# Patient Record
Sex: Female | Born: 1935 | Race: White | Hispanic: No | Marital: Married | Smoking: Former smoker
Health system: Southern US, Community
[De-identification: ages and names within clinical notes are randomized; demographics above are authoritative.]

## PROBLEM LIST (undated history)

## (undated) DIAGNOSIS — R0602 Shortness of breath: Secondary | ICD-10-CM

## (undated) DIAGNOSIS — I517 Cardiomegaly: Secondary | ICD-10-CM

## (undated) DIAGNOSIS — I5032 Chronic diastolic (congestive) heart failure: Secondary | ICD-10-CM

## (undated) DIAGNOSIS — I421 Obstructive hypertrophic cardiomyopathy: Secondary | ICD-10-CM

## (undated) DIAGNOSIS — R011 Cardiac murmur, unspecified: Secondary | ICD-10-CM

## (undated) DIAGNOSIS — E7800 Pure hypercholesterolemia, unspecified: Secondary | ICD-10-CM

## (undated) DIAGNOSIS — R0600 Dyspnea, unspecified: Secondary | ICD-10-CM

## (undated) HISTORY — DX: Obstructive hypertrophic cardiomyopathy: I42.1

## (undated) HISTORY — DX: Cardiomegaly: I51.7

## (undated) HISTORY — DX: Pure hypercholesterolemia, unspecified: E78.00

## (undated) HISTORY — DX: Dyspnea, unspecified: R06.00

## (undated) HISTORY — DX: Cardiac murmur, unspecified: R01.1

## (undated) HISTORY — DX: Shortness of breath: R06.02

---

## 2000-01-09 ENCOUNTER — Encounter: Payer: Self-pay | Admitting: Family Medicine

## 2000-01-09 ENCOUNTER — Encounter: Admission: RE | Admit: 2000-01-09 | Discharge: 2000-01-09 | Payer: Self-pay | Admitting: Family Medicine

## 2001-01-15 ENCOUNTER — Encounter: Admission: RE | Admit: 2001-01-15 | Discharge: 2001-01-15 | Payer: Self-pay | Admitting: Family Medicine

## 2001-01-15 ENCOUNTER — Encounter: Payer: Self-pay | Admitting: Family Medicine

## 2001-01-28 ENCOUNTER — Ambulatory Visit (HOSPITAL_COMMUNITY): Admission: RE | Admit: 2001-01-28 | Discharge: 2001-01-28 | Payer: Self-pay | Admitting: *Deleted

## 2001-01-28 ENCOUNTER — Encounter (INDEPENDENT_AMBULATORY_CARE_PROVIDER_SITE_OTHER): Payer: Self-pay | Admitting: *Deleted

## 2001-09-12 ENCOUNTER — Encounter: Admission: RE | Admit: 2001-09-12 | Discharge: 2001-09-12 | Payer: Self-pay | Admitting: Family Medicine

## 2001-09-12 ENCOUNTER — Encounter: Payer: Self-pay | Admitting: Family Medicine

## 2001-12-12 ENCOUNTER — Other Ambulatory Visit: Admission: RE | Admit: 2001-12-12 | Discharge: 2001-12-12 | Payer: Self-pay | Admitting: Family Medicine

## 2002-01-17 ENCOUNTER — Encounter: Payer: Self-pay | Admitting: Family Medicine

## 2002-01-17 ENCOUNTER — Encounter: Admission: RE | Admit: 2002-01-17 | Discharge: 2002-01-17 | Payer: Self-pay | Admitting: Family Medicine

## 2002-02-05 ENCOUNTER — Encounter: Payer: Self-pay | Admitting: Family Medicine

## 2002-02-05 ENCOUNTER — Encounter: Admission: RE | Admit: 2002-02-05 | Discharge: 2002-02-05 | Payer: Self-pay | Admitting: Family Medicine

## 2002-12-20 ENCOUNTER — Emergency Department (HOSPITAL_COMMUNITY): Admission: EM | Admit: 2002-12-20 | Discharge: 2002-12-20 | Payer: Self-pay

## 2002-12-29 ENCOUNTER — Encounter: Admission: RE | Admit: 2002-12-29 | Discharge: 2002-12-29 | Payer: Self-pay | Admitting: Family Medicine

## 2003-03-04 ENCOUNTER — Other Ambulatory Visit: Admission: RE | Admit: 2003-03-04 | Discharge: 2003-03-04 | Payer: Self-pay | Admitting: Family Medicine

## 2003-03-20 ENCOUNTER — Encounter: Admission: RE | Admit: 2003-03-20 | Discharge: 2003-03-20 | Payer: Self-pay | Admitting: Family Medicine

## 2003-05-18 ENCOUNTER — Encounter: Admission: RE | Admit: 2003-05-18 | Discharge: 2003-05-18 | Payer: Self-pay | Admitting: Family Medicine

## 2003-06-04 ENCOUNTER — Ambulatory Visit (HOSPITAL_COMMUNITY): Admission: RE | Admit: 2003-06-04 | Discharge: 2003-06-04 | Payer: Self-pay | Admitting: Family Medicine

## 2004-06-28 ENCOUNTER — Other Ambulatory Visit: Admission: RE | Admit: 2004-06-28 | Discharge: 2004-06-28 | Payer: Self-pay | Admitting: Family Medicine

## 2004-07-06 ENCOUNTER — Encounter: Admission: RE | Admit: 2004-07-06 | Discharge: 2004-07-06 | Payer: Self-pay | Admitting: Family Medicine

## 2005-08-16 ENCOUNTER — Encounter: Admission: RE | Admit: 2005-08-16 | Discharge: 2005-08-16 | Payer: Self-pay | Admitting: Family Medicine

## 2005-08-28 ENCOUNTER — Other Ambulatory Visit: Admission: RE | Admit: 2005-08-28 | Discharge: 2005-08-28 | Payer: Self-pay | Admitting: Family Medicine

## 2006-09-04 ENCOUNTER — Encounter: Admission: RE | Admit: 2006-09-04 | Discharge: 2006-09-04 | Payer: Self-pay | Admitting: Family Medicine

## 2007-11-06 ENCOUNTER — Encounter: Admission: RE | Admit: 2007-11-06 | Discharge: 2007-11-06 | Payer: Self-pay | Admitting: Family Medicine

## 2007-12-10 ENCOUNTER — Other Ambulatory Visit: Admission: RE | Admit: 2007-12-10 | Discharge: 2007-12-10 | Payer: Self-pay | Admitting: Family Medicine

## 2009-01-15 ENCOUNTER — Encounter: Admission: RE | Admit: 2009-01-15 | Discharge: 2009-01-15 | Payer: Self-pay | Admitting: Family Medicine

## 2009-11-17 ENCOUNTER — Ambulatory Visit: Payer: Self-pay | Admitting: Cardiology

## 2009-11-29 ENCOUNTER — Ambulatory Visit (HOSPITAL_COMMUNITY): Admission: RE | Admit: 2009-11-29 | Discharge: 2009-11-29 | Payer: Self-pay | Admitting: Cardiology

## 2009-11-29 ENCOUNTER — Ambulatory Visit: Payer: Self-pay

## 2009-11-29 ENCOUNTER — Ambulatory Visit: Payer: Self-pay | Admitting: Internal Medicine

## 2009-11-29 ENCOUNTER — Encounter: Payer: Self-pay | Admitting: Cardiology

## 2010-01-18 ENCOUNTER — Encounter
Admission: RE | Admit: 2010-01-18 | Discharge: 2010-01-18 | Payer: Self-pay | Source: Home / Self Care | Attending: Family Medicine | Admitting: Family Medicine

## 2010-05-23 ENCOUNTER — Ambulatory Visit: Payer: Self-pay | Admitting: Cardiology

## 2010-05-27 ENCOUNTER — Encounter: Payer: Self-pay | Admitting: Cardiology

## 2010-05-27 DIAGNOSIS — R011 Cardiac murmur, unspecified: Secondary | ICD-10-CM | POA: Insufficient documentation

## 2010-05-27 DIAGNOSIS — E7800 Pure hypercholesterolemia, unspecified: Secondary | ICD-10-CM | POA: Insufficient documentation

## 2010-05-27 DIAGNOSIS — R0602 Shortness of breath: Secondary | ICD-10-CM | POA: Insufficient documentation

## 2010-05-27 DIAGNOSIS — I517 Cardiomegaly: Secondary | ICD-10-CM | POA: Insufficient documentation

## 2010-05-27 DIAGNOSIS — R0600 Dyspnea, unspecified: Secondary | ICD-10-CM | POA: Insufficient documentation

## 2010-06-06 ENCOUNTER — Encounter: Payer: Self-pay | Admitting: Cardiology

## 2010-06-06 ENCOUNTER — Ambulatory Visit (INDEPENDENT_AMBULATORY_CARE_PROVIDER_SITE_OTHER): Payer: Medicare Other | Admitting: Cardiology

## 2010-06-06 DIAGNOSIS — I517 Cardiomegaly: Secondary | ICD-10-CM

## 2010-06-06 DIAGNOSIS — R0602 Shortness of breath: Secondary | ICD-10-CM

## 2010-06-06 NOTE — Patient Instructions (Signed)
Monitor blood pressure at home. Call if your readings are greater than 140 (top) or 85 (bottom).  You need to get more aerobic exercise and lose weight.  Continue current medications.

## 2010-06-07 NOTE — Progress Notes (Signed)
   Melinda Gonzales Date of Birth: Jul 03, 1934   History of Present Illness: [name] is seen for six-month followup of her dyspnea. She is very upset today. Her husband recently had a carotid endarterectomy and suffered a small stroke after this procedure. She states it has changed his personality and he is much more depressed now. She is very concerned about this. In reviewing her own history she still complains of shortness of breath with exertion. She admits that she hasn't been doing exercise and hasn't lost any weight as we had recommended before. Her symptoms really have not changed significantly. She does complain of some sweats at night. She has had no chest pain.  Current Outpatient Prescriptions on File Prior to Visit  Medication Sig Dispense Refill  . Calcium Carbonate-Vitamin D (CALCIUM + D PO) Take 600 mg by mouth daily.        . folic acid (FOLVITE) 800 MCG tablet Take 400 mcg by mouth daily.        . Multiple Vitamin (MULTIVITAMIN) tablet Take 1 tablet by mouth daily.        . Omega-3 Fatty Acids (FISH OIL) 1000 MG CAPS Take 1,000 mg by mouth 2 (two) times daily.        . rosuvastatin (CRESTOR) 5 MG tablet Take 5 mg by mouth daily.        . Glucosamine-Chondroitin (OSTEO BI-FLEX REGULAR STRENGTH PO) Take by mouth daily. 2 DAILY         No Known Allergies  Past Medical History  Diagnosis Date  . Hypercholesterolemia   . Dyspnea   . Heart murmur   . LVH (left ventricular hypertrophy)     WITH NORMAL SYSTOLIC FUNCTION  . SOB (shortness of breath)     History reviewed. No pertinent past surgical history.  History  Smoking status  . Former Smoker  . Quit date: 05/26/1965  Smokeless tobacco  . Not on file    History  Alcohol Use No    Family History  Problem Relation Age of Onset  . Heart failure Mother 36  . Heart attack Father 52    Review of Systems: The review of systems is positive for anxiety and occasional lightheadedness. She has chronic  dyspnea on exertion.  All other systems were reviewed and are negative.  Physical Exam: BP 150/82  Pulse 73  Ht 5\' 1"  (1.549 m)  Wt 194 lb (87.998 kg)  BMI 36.66 kg/m2 She is an obese white female in no distress. Her HEENT exam is unremarkable. She has no JVD or bruits. Lungs are clear. Cardiac exam reveals a harsh grade 2/6 systolic murmur the right upper sternal border. There is no S3. Abdomen is soft and nontender. She has no edema. Pedal pulses are excellent. Neurologic exam is nonfocal. LABORATORY DATA: ECG today demonstrates normal sinus rhythm with mild voltage criteria for LVH with nonspecific T-wave abnormality.  Assessment / Plan:

## 2010-06-07 NOTE — Assessment & Plan Note (Signed)
Her symptoms of dyspnea are unchanged. She had extensive cardiac evaluation in the past including nuclear stress testing which was normal and an echocardiogram this past October which showed moderate LVH with normal systolic function. There was mild mitral insufficiency. It was felt that her murmur was related to her vigorous LV contractility. I think most of her symptoms are related to obesity and poor cardiovascular conditioning. I have recommended a regular aerobic exercise walking 30-45 minutes per day with focus on losing 20-30 pounds. I think if she does this she would notice a marked improvement in her symptoms. The other consideration would be to consider a sleep study but I think it is unlikely that she has sleep apnea.

## 2010-06-07 NOTE — Assessment & Plan Note (Signed)
Blood pressure is mildly elevated today but readings in the past been acceptable. I've asked that she monitor her blood pressure readings regularly and report back if there over 140/85. If so we would need to add additional antihypertensive therapy.

## 2010-12-14 ENCOUNTER — Other Ambulatory Visit: Payer: Self-pay | Admitting: Family Medicine

## 2010-12-14 DIAGNOSIS — Z1231 Encounter for screening mammogram for malignant neoplasm of breast: Secondary | ICD-10-CM

## 2011-02-02 ENCOUNTER — Ambulatory Visit
Admission: RE | Admit: 2011-02-02 | Discharge: 2011-02-02 | Disposition: A | Discharge status: Home / Self Care | Payer: Medicare Other | Source: Ambulatory Visit | Attending: Family Medicine | Admitting: Family Medicine

## 2011-02-02 DIAGNOSIS — Z1231 Encounter for screening mammogram for malignant neoplasm of breast: Secondary | ICD-10-CM | POA: Diagnosis not present

## 2011-02-21 DIAGNOSIS — E7800 Pure hypercholesterolemia, unspecified: Secondary | ICD-10-CM | POA: Diagnosis not present

## 2011-08-28 DIAGNOSIS — Z780 Asymptomatic menopausal state: Secondary | ICD-10-CM | POA: Diagnosis not present

## 2011-08-28 DIAGNOSIS — Z0000 Encounter for general adult medical examination without abnormal findings: Secondary | ICD-10-CM | POA: Diagnosis not present

## 2011-08-28 DIAGNOSIS — E7800 Pure hypercholesterolemia, unspecified: Secondary | ICD-10-CM | POA: Diagnosis not present

## 2011-10-12 DIAGNOSIS — Z23 Encounter for immunization: Secondary | ICD-10-CM | POA: Diagnosis not present

## 2011-10-12 DIAGNOSIS — Z780 Asymptomatic menopausal state: Secondary | ICD-10-CM | POA: Diagnosis not present

## 2012-04-04 DIAGNOSIS — J9801 Acute bronchospasm: Secondary | ICD-10-CM | POA: Diagnosis not present

## 2012-04-04 DIAGNOSIS — R05 Cough: Secondary | ICD-10-CM | POA: Diagnosis not present

## 2012-04-04 DIAGNOSIS — R059 Cough, unspecified: Secondary | ICD-10-CM | POA: Diagnosis not present

## 2012-04-04 DIAGNOSIS — J40 Bronchitis, not specified as acute or chronic: Secondary | ICD-10-CM | POA: Diagnosis not present

## 2012-09-12 DIAGNOSIS — M719 Bursopathy, unspecified: Secondary | ICD-10-CM | POA: Diagnosis not present

## 2012-09-12 DIAGNOSIS — M67919 Unspecified disorder of synovium and tendon, unspecified shoulder: Secondary | ICD-10-CM | POA: Diagnosis not present

## 2012-09-16 DIAGNOSIS — M67919 Unspecified disorder of synovium and tendon, unspecified shoulder: Secondary | ICD-10-CM | POA: Diagnosis not present

## 2012-09-23 DIAGNOSIS — M67919 Unspecified disorder of synovium and tendon, unspecified shoulder: Secondary | ICD-10-CM | POA: Diagnosis not present

## 2012-09-24 DIAGNOSIS — B079 Viral wart, unspecified: Secondary | ICD-10-CM | POA: Diagnosis not present

## 2012-09-24 DIAGNOSIS — L570 Actinic keratosis: Secondary | ICD-10-CM | POA: Diagnosis not present

## 2012-09-24 DIAGNOSIS — Z85828 Personal history of other malignant neoplasm of skin: Secondary | ICD-10-CM | POA: Diagnosis not present

## 2012-09-24 DIAGNOSIS — D239 Other benign neoplasm of skin, unspecified: Secondary | ICD-10-CM | POA: Diagnosis not present

## 2012-10-07 DIAGNOSIS — M67919 Unspecified disorder of synovium and tendon, unspecified shoulder: Secondary | ICD-10-CM | POA: Diagnosis not present

## 2012-10-09 DIAGNOSIS — Z23 Encounter for immunization: Secondary | ICD-10-CM | POA: Diagnosis not present

## 2012-10-15 DIAGNOSIS — M67919 Unspecified disorder of synovium and tendon, unspecified shoulder: Secondary | ICD-10-CM | POA: Diagnosis not present

## 2012-10-22 DIAGNOSIS — M67919 Unspecified disorder of synovium and tendon, unspecified shoulder: Secondary | ICD-10-CM | POA: Diagnosis not present

## 2012-10-29 DIAGNOSIS — M67919 Unspecified disorder of synovium and tendon, unspecified shoulder: Secondary | ICD-10-CM | POA: Diagnosis not present

## 2013-03-06 DIAGNOSIS — E7800 Pure hypercholesterolemia, unspecified: Secondary | ICD-10-CM | POA: Diagnosis not present

## 2013-03-06 DIAGNOSIS — R011 Cardiac murmur, unspecified: Secondary | ICD-10-CM | POA: Diagnosis not present

## 2013-03-06 DIAGNOSIS — Z23 Encounter for immunization: Secondary | ICD-10-CM | POA: Diagnosis not present

## 2013-03-06 DIAGNOSIS — Z0000 Encounter for general adult medical examination without abnormal findings: Secondary | ICD-10-CM | POA: Diagnosis not present

## 2013-03-10 ENCOUNTER — Other Ambulatory Visit: Payer: Self-pay

## 2013-03-10 DIAGNOSIS — Z1231 Encounter for screening mammogram for malignant neoplasm of breast: Secondary | ICD-10-CM

## 2013-03-27 ENCOUNTER — Ambulatory Visit: Payer: Medicare Other

## 2013-04-03 DIAGNOSIS — D485 Neoplasm of uncertain behavior of skin: Secondary | ICD-10-CM | POA: Diagnosis not present

## 2013-04-03 DIAGNOSIS — D0439 Carcinoma in situ of skin of other parts of face: Secondary | ICD-10-CM | POA: Diagnosis not present

## 2013-04-03 DIAGNOSIS — L570 Actinic keratosis: Secondary | ICD-10-CM | POA: Diagnosis not present

## 2013-04-03 DIAGNOSIS — L821 Other seborrheic keratosis: Secondary | ICD-10-CM | POA: Diagnosis not present

## 2013-04-03 DIAGNOSIS — Z85828 Personal history of other malignant neoplasm of skin: Secondary | ICD-10-CM | POA: Diagnosis not present

## 2013-04-03 DIAGNOSIS — D0430 Carcinoma in situ of skin of unspecified part of face: Secondary | ICD-10-CM | POA: Diagnosis not present

## 2013-04-10 DIAGNOSIS — D0439 Carcinoma in situ of skin of other parts of face: Secondary | ICD-10-CM | POA: Diagnosis not present

## 2013-04-10 DIAGNOSIS — D0430 Carcinoma in situ of skin of unspecified part of face: Secondary | ICD-10-CM | POA: Diagnosis not present

## 2013-04-10 DIAGNOSIS — Z85828 Personal history of other malignant neoplasm of skin: Secondary | ICD-10-CM | POA: Diagnosis not present

## 2013-04-17 ENCOUNTER — Ambulatory Visit
Admission: RE | Admit: 2013-04-17 | Discharge: 2013-04-17 | Disposition: A | Discharge status: Home / Self Care | Payer: Medicare Other | Source: Ambulatory Visit

## 2013-04-17 DIAGNOSIS — Z1231 Encounter for screening mammogram for malignant neoplasm of breast: Secondary | ICD-10-CM | POA: Diagnosis not present

## 2013-04-24 DIAGNOSIS — H2510 Age-related nuclear cataract, unspecified eye: Secondary | ICD-10-CM | POA: Diagnosis not present

## 2013-04-24 DIAGNOSIS — H3530 Unspecified macular degeneration: Secondary | ICD-10-CM | POA: Diagnosis not present

## 2013-09-04 DIAGNOSIS — E7800 Pure hypercholesterolemia, unspecified: Secondary | ICD-10-CM | POA: Diagnosis not present

## 2013-10-25 DIAGNOSIS — Z23 Encounter for immunization: Secondary | ICD-10-CM | POA: Diagnosis not present

## 2014-03-02 ENCOUNTER — Ambulatory Visit (INDEPENDENT_AMBULATORY_CARE_PROVIDER_SITE_OTHER): Payer: Medicare Other | Admitting: Cardiology

## 2014-03-02 ENCOUNTER — Ambulatory Visit
Admission: RE | Admit: 2014-03-02 | Discharge: 2014-03-02 | Disposition: A | Discharge status: Home / Self Care | Payer: Medicare Other | Source: Ambulatory Visit | Attending: Cardiology | Admitting: Cardiology

## 2014-03-02 ENCOUNTER — Encounter: Payer: Self-pay | Admitting: Cardiology

## 2014-03-02 VITALS — BP 120/68 | HR 78 | Ht 62.0 in | Wt 189.8 lb

## 2014-03-02 DIAGNOSIS — I517 Cardiomegaly: Secondary | ICD-10-CM

## 2014-03-02 DIAGNOSIS — J984 Other disorders of lung: Secondary | ICD-10-CM | POA: Diagnosis not present

## 2014-03-02 DIAGNOSIS — R0600 Dyspnea, unspecified: Secondary | ICD-10-CM | POA: Diagnosis not present

## 2014-03-02 DIAGNOSIS — R0602 Shortness of breath: Secondary | ICD-10-CM

## 2014-03-02 DIAGNOSIS — R011 Cardiac murmur, unspecified: Secondary | ICD-10-CM

## 2014-03-02 DIAGNOSIS — E7800 Pure hypercholesterolemia, unspecified: Secondary | ICD-10-CM

## 2014-03-02 NOTE — Progress Notes (Signed)
   Melinda Gonzales Date of Birth: 09/16/1934   History of Present Illness: [name] is referred by Dr Laurann Montana for evaluation of dyspnea.  She is a 80 yo WF with history of obesity and hypercholesterolemia. She complains of dyspnea with exertion particularly if she is carrying groceries or walking up steps or an incline. She does not exercise and is very sedentary. She gets occasional palpitations and chest tightness at rest. She complains of pain in her back between her shoulder blades. She denies any cough or wheezing. She complains of sweats.  She was evaluated in 2011 with a Myoview stress test which was normal. She had an Echo at that time that showed moderate LVH with EF 65-70%. Mild MR. She does have a history of murmur. She is not a smoker.   Current Outpatient Prescriptions on File Prior to Visit  Medication Sig Dispense Refill  . rosuvastatin (CRESTOR) 5 MG tablet Take 5 mg by mouth daily.       No current facility-administered medications on file prior to visit.    No Known Allergies  Past Medical History  Diagnosis Date  . Hypercholesterolemia   . Dyspnea   . Heart murmur   . LVH (left ventricular hypertrophy)     WITH NORMAL SYSTOLIC FUNCTION  . SOB (shortness of breath)     History reviewed. No pertinent past surgical history.  History  Smoking status  . Former Smoker  . Quit date: 05/26/1965  Smokeless tobacco  . Not on file    History  Alcohol Use No    Family History  Problem Relation Age of Onset  . Heart failure Mother 4  . Heart attack Father 35    Review of Systems: The review of systems is positive for anxiety.   All other systems were reviewed and are negative.  Physical Exam: BP 120/68 mmHg  Pulse 78  Ht 5\' 2"  (1.575 m)  Wt 189 lb 12.8 oz (86.093 kg)  BMI 34.71 kg/m2 She is an obese white female in no distress. Her HEENT exam is unremarkable. She has no JVD or bruits. Lungs are clear. Cardiac exam reveals a harsh grade 3/6  systolic murmur at the  right upper sternal border radiating to the apex. There is no S3. Abdomen is soft and nontender. She has no edema. Pedal pulses are excellent. Neurologic exam is nonfocal. Body mass index is 34.71 kg/(m^2).   LABORATORY DATA: ECG today demonstrates normal sinus rhythm with rate 78 bpm. LVH with repolarization abnormality. I have personally reviewed and interpreted this study.   Assessment / Plan: 1. Dyspnea on exertion. This is a chronic complaint. Cardiac evaluation in 2011 was unremarkable. I suspect her symptoms are largely related to her obesity and deconditioning. Her last CXR in 2005 showed changes of chronic bronchitis. Will update CXR. She is scheduled for lab work next week with Dr. Laurann Montana. Will add BNP level. She does have a prominent murmur. Will repeat an Echo. If these studies are unrevealing I would consider her for a cardiopulmonary stress test.  2. Murmur. Prior Echo was unrevealing. Will repeat. She may have a dynamic LVOT obstruction. ?HOCM  3. Palpitations. Will evaluate with 48 hr Holter monitor.  4. Obesity.  5. Hypercholesterolemia.

## 2014-03-02 NOTE — Patient Instructions (Signed)
We will schedule you for a chest Xray and Echocardiogram  We will have you wear a monitor for 48 hours  We will review your lab data from Dr. Laurann Montana  Further recommendations will follow.

## 2014-03-04 ENCOUNTER — Other Ambulatory Visit: Payer: Self-pay

## 2014-03-04 DIAGNOSIS — R002 Palpitations: Secondary | ICD-10-CM

## 2014-03-05 ENCOUNTER — Encounter: Payer: Self-pay | Admitting: *Deleted

## 2014-03-05 ENCOUNTER — Encounter (INDEPENDENT_AMBULATORY_CARE_PROVIDER_SITE_OTHER): Payer: Medicare Other

## 2014-03-05 ENCOUNTER — Ambulatory Visit (HOSPITAL_COMMUNITY): Payer: Medicare Other | Attending: Cardiology | Admitting: Radiology

## 2014-03-05 DIAGNOSIS — R0602 Shortness of breath: Secondary | ICD-10-CM | POA: Diagnosis present

## 2014-03-05 DIAGNOSIS — R011 Cardiac murmur, unspecified: Secondary | ICD-10-CM | POA: Diagnosis not present

## 2014-03-05 DIAGNOSIS — R002 Palpitations: Secondary | ICD-10-CM

## 2014-03-05 DIAGNOSIS — I517 Cardiomegaly: Secondary | ICD-10-CM

## 2014-03-05 DIAGNOSIS — R0600 Dyspnea, unspecified: Secondary | ICD-10-CM | POA: Diagnosis not present

## 2014-03-05 DIAGNOSIS — E7800 Pure hypercholesterolemia, unspecified: Secondary | ICD-10-CM

## 2014-03-05 NOTE — Progress Notes (Signed)
Echocardiogram performed.  

## 2014-03-05 NOTE — Progress Notes (Signed)
Patient ID: Melinda Gonzales, female   DOB: 18-Apr-1934, 80 y.o.   MRN: [id number] [employer] 48 hour holter monitor applied to patient.

## 2014-03-09 ENCOUNTER — Telehealth: Payer: Self-pay | Admitting: Cardiology

## 2014-03-09 ENCOUNTER — Other Ambulatory Visit: Payer: Self-pay

## 2014-03-09 ENCOUNTER — Other Ambulatory Visit: Payer: Medicare Other

## 2014-03-09 ENCOUNTER — Other Ambulatory Visit (INDEPENDENT_AMBULATORY_CARE_PROVIDER_SITE_OTHER): Payer: Medicare Other | Admitting: *Deleted

## 2014-03-09 DIAGNOSIS — R0600 Dyspnea, unspecified: Secondary | ICD-10-CM

## 2014-03-09 DIAGNOSIS — I517 Cardiomegaly: Secondary | ICD-10-CM

## 2014-03-09 DIAGNOSIS — R011 Cardiac murmur, unspecified: Secondary | ICD-10-CM

## 2014-03-09 DIAGNOSIS — E7800 Pure hypercholesterolemia, unspecified: Secondary | ICD-10-CM

## 2014-03-09 LAB — BASIC METABOLIC PANEL
BUN: 22 mg/dL (ref 6–23)
CO2: 28 mEq/L (ref 19–32)
CREATININE: 1 mg/dL (ref 0.40–1.20)
Calcium: 9.3 mg/dL (ref 8.4–10.5)
Chloride: 106 mEq/L (ref 96–112)
GFR: 57.02 mL/min — ABNORMAL LOW (ref >60.00)
GLUCOSE: 100 mg/dL — AB (ref 70–99)
POTASSIUM: 4 meq/L (ref 3.5–5.1)
SODIUM: 140 meq/L (ref 135–145)

## 2014-03-09 LAB — LIPID PANEL
CHOLESTEROL: 168 mg/dL (ref 0–200)
HDL: 50.8 mg/dL (ref >39.00)
LDL Cholesterol: 86 mg/dL (ref 0–99)
NonHDL: 117.2
TRIGLYCERIDES: 157 mg/dL — AB (ref 0.0–149.0)
Total CHOL/HDL Ratio: 3
VLDL: 31.4 mg/dL (ref 0.0–40.0)

## 2014-03-09 LAB — BRAIN NATRIURETIC PEPTIDE: Pro B Natriuretic peptide (BNP): 206 pg/mL — ABNORMAL HIGH (ref 0.0–100.0)

## 2014-03-09 LAB — HEPATIC FUNCTION PANEL
ALT: 7 U/L (ref 0–35)
AST: 14 U/L (ref 0–37)
Albumin: 4.1 g/dL (ref 3.5–5.2)
Alkaline Phosphatase: 68 U/L (ref 39–117)
Bilirubin, Direct: 0.1 mg/dL (ref 0.0–0.3)
Total Bilirubin: 0.4 mg/dL (ref 0.2–1.2)
Total Protein: 7.2 g/dL (ref 6.0–8.3)

## 2014-03-09 MED ORDER — POTASSIUM CHLORIDE CRYS ER 20 MEQ PO TBCR: 20.0000 meq | EXTENDED_RELEASE_TABLET | Freq: Every day | ORAL | Status: DC

## 2014-03-09 MED ORDER — FUROSEMIDE 20 MG PO TABS: 20.0000 mg | ORAL_TABLET | Freq: Every day | ORAL | Status: DC

## 2014-03-09 MED ORDER — METOPROLOL SUCCINATE ER 50 MG PO TB24: 50.0000 mg | ORAL_TABLET | Freq: Every day | ORAL | Status: DC

## 2014-03-09 NOTE — Telephone Encounter (Signed)
Returned call to patient she stated she wanted to clarify medication that was sent to pharmacy this morning.Medication reviewed with patient she verbalized understanding.Advised to call back if needed.

## 2014-03-09 NOTE — Telephone Encounter (Signed)
Returning your call. °

## 2014-03-10 ENCOUNTER — Telehealth: Payer: Self-pay | Admitting: Cardiology

## 2014-03-10 NOTE — Telephone Encounter (Signed)
Pt called in stating that she has not received a call for anyone to schedule her cardiac MRI . Please f/u  Thanks

## 2014-03-10 NOTE — Telephone Encounter (Signed)
Returned call to patient I will send another message to schedulers to schedule Cardiac MRI.

## 2014-03-11 ENCOUNTER — Encounter: Payer: Self-pay | Admitting: Cardiology

## 2014-03-13 DIAGNOSIS — K219 Gastro-esophageal reflux disease without esophagitis: Secondary | ICD-10-CM | POA: Diagnosis not present

## 2014-03-13 DIAGNOSIS — I420 Dilated cardiomyopathy: Secondary | ICD-10-CM | POA: Diagnosis not present

## 2014-03-13 DIAGNOSIS — E780 Pure hypercholesterolemia: Secondary | ICD-10-CM | POA: Diagnosis not present

## 2014-03-13 DIAGNOSIS — N183 Chronic kidney disease, stage 3 (moderate): Secondary | ICD-10-CM | POA: Diagnosis not present

## 2014-03-13 DIAGNOSIS — R011 Cardiac murmur, unspecified: Secondary | ICD-10-CM | POA: Diagnosis not present

## 2014-03-13 DIAGNOSIS — Z0000 Encounter for general adult medical examination without abnormal findings: Secondary | ICD-10-CM | POA: Diagnosis not present

## 2014-03-13 DIAGNOSIS — Z01419 Encounter for gynecological examination (general) (routine) without abnormal findings: Secondary | ICD-10-CM | POA: Diagnosis not present

## 2014-03-13 DIAGNOSIS — Z1389 Encounter for screening for other disorder: Secondary | ICD-10-CM | POA: Diagnosis not present

## 2014-03-13 NOTE — Telephone Encounter (Signed)
Spoke to patient Cardiac MRI scheduled 03/25/14 at 3:00 pm at [facility] mailed instructions.Follow up appointment scheduled with Dr.Jordan 04/03/14 at 9:45 pm.

## 2014-03-20 ENCOUNTER — Telehealth: Payer: Self-pay

## 2014-03-20 NOTE — Telephone Encounter (Signed)
Patient called Dr.Jordan reviewed 48 hour monitor,revealed occasional pac's,pvc's.5 beat run pac's.9 beat run of nsvt.Continue same medications.Keep appointment with Dr.Jordan 04/23/14 at 3:30 pm.

## 2014-03-25 ENCOUNTER — Ambulatory Visit (HOSPITAL_COMMUNITY)
Admission: RE | Admit: 2014-03-25 | Discharge: 2014-03-25 | Disposition: A | Discharge status: Home / Self Care | Payer: Medicare Other | Source: Ambulatory Visit | Attending: Cardiology | Admitting: Cardiology

## 2014-03-25 DIAGNOSIS — E7800 Pure hypercholesterolemia, unspecified: Secondary | ICD-10-CM

## 2014-03-25 DIAGNOSIS — R0600 Dyspnea, unspecified: Secondary | ICD-10-CM | POA: Diagnosis not present

## 2014-03-25 DIAGNOSIS — R011 Cardiac murmur, unspecified: Secondary | ICD-10-CM | POA: Diagnosis not present

## 2014-03-25 DIAGNOSIS — I517 Cardiomegaly: Secondary | ICD-10-CM | POA: Diagnosis not present

## 2014-03-25 MED ORDER — GADOBENATE DIMEGLUMINE 529 MG/ML IV SOLN
30.0000 mL | Freq: Once | INTRAVENOUS | Status: AC
  Administered 2014-03-25: 30 mL via INTRAVENOUS

## 2014-04-03 ENCOUNTER — Ambulatory Visit: Payer: Medicare Other | Admitting: Cardiology

## 2014-04-16 DIAGNOSIS — L570 Actinic keratosis: Secondary | ICD-10-CM | POA: Diagnosis not present

## 2014-04-16 DIAGNOSIS — D485 Neoplasm of uncertain behavior of skin: Secondary | ICD-10-CM | POA: Diagnosis not present

## 2014-04-16 DIAGNOSIS — Z85828 Personal history of other malignant neoplasm of skin: Secondary | ICD-10-CM | POA: Diagnosis not present

## 2014-04-23 ENCOUNTER — Encounter: Payer: Self-pay | Admitting: Cardiology

## 2014-04-23 ENCOUNTER — Ambulatory Visit (INDEPENDENT_AMBULATORY_CARE_PROVIDER_SITE_OTHER): Payer: Medicare Other | Admitting: Cardiology

## 2014-04-23 VITALS — BP 130/70 | HR 82 | Ht 61.5 in | Wt 188.0 lb

## 2014-04-23 DIAGNOSIS — I472 Ventricular tachycardia: Secondary | ICD-10-CM

## 2014-04-23 DIAGNOSIS — E7800 Pure hypercholesterolemia, unspecified: Secondary | ICD-10-CM

## 2014-04-23 DIAGNOSIS — I421 Obstructive hypertrophic cardiomyopathy: Secondary | ICD-10-CM

## 2014-04-23 DIAGNOSIS — I4729 Other ventricular tachycardia: Secondary | ICD-10-CM

## 2014-04-23 DIAGNOSIS — R0602 Shortness of breath: Secondary | ICD-10-CM

## 2014-04-23 MED ORDER — METOPROLOL SUCCINATE ER 100 MG PO TB24: 100.0000 mg | ORAL_TABLET | Freq: Every day | ORAL | Status: DC

## 2014-04-23 NOTE — Patient Instructions (Signed)
Increase Toprol XL to 100 mg dailly  We will check your potassium level and renal function  I will see you in 6 weeks.  Try and lose weight.

## 2014-04-24 DIAGNOSIS — I4729 Other ventricular tachycardia: Secondary | ICD-10-CM | POA: Insufficient documentation

## 2014-04-24 DIAGNOSIS — I472 Ventricular tachycardia: Secondary | ICD-10-CM | POA: Insufficient documentation

## 2014-04-24 LAB — BASIC METABOLIC PANEL
BUN: 26 mg/dL — AB (ref 6–23)
CHLORIDE: 105 meq/L (ref 96–112)
CO2: 20 meq/L (ref 19–32)
CREATININE: 1.01 mg/dL (ref 0.50–1.10)
Calcium: 9.4 mg/dL (ref 8.4–10.5)
Glucose, Bld: 103 mg/dL — ABNORMAL HIGH (ref 70–99)
Potassium: 5 mEq/L (ref 3.5–5.3)
Sodium: 139 mEq/L (ref 135–145)

## 2014-04-24 NOTE — Progress Notes (Signed)
Melinda Gonzales Date of Birth: 28-Sep-1934   History of Present Illness: [name] is seen for follow up HOCM.  She is a 80 yo WF with history of obesity and hypercholesterolemia. She presented with complaints of dyspnea with exertion particularly if she is carrying groceries or walking up steps or an incline. She does not exercise and is very sedentary. She gets occasional palpitations and chest tightness at rest. She complains of pain in her back between her shoulder blades. She denies any cough or wheezing. She complains of sweats.  She was evaluated in 2011 with a Myoview stress test which was normal. She had an Echo at that time that showed moderate LVH with EF 65-70%. Mild MR. She does have a history of murmur. She is not a smoker. On recent evaluation we repeated an Echo and also did a cardiac MRI. These with both consistent with HOCM. She was started on low dose lasix and Toprol XL 50 mg daily. She has noted little change in her symptoms. No dizziness or syncope. No known family history of HCM. Her father died of an MI at age 15. Mother died of CHF at age 26. No family history of sudden death.  Current Outpatient Prescriptions on File Prior to Visit  Medication Sig Dispense Refill  . furosemide (LASIX) 20 MG tablet Take 1 tablet (20 mg total) by mouth daily. 30 tablet 6  . potassium chloride SA (K-DUR,KLOR-CON) 20 MEQ tablet Take 1 tablet (20 mEq total) by mouth daily. 30 tablet 6  . rosuvastatin (CRESTOR) 5 MG tablet Take 5 mg by mouth daily.       No current facility-administered medications on file prior to visit.    No Known Allergies  Past Medical History  Diagnosis Date  . Hypercholesterolemia   . Dyspnea   . Heart murmur   . LVH (left ventricular hypertrophy)     WITH NORMAL SYSTOLIC FUNCTION  . SOB (shortness of breath)   . Hypertrophic obstructive cardiomyopathy (HOCM)     History reviewed. No pertinent past surgical history.  History  Smoking status  .  Former Smoker  . Quit date: 05/26/1965  Smokeless tobacco  . Not on file    History  Alcohol Use No    Family History  Problem Relation Age of Onset  . Heart failure Mother 27  . Heart attack Father 80    Review of Systems: The review of systems is positive for anxiety.   All other systems were reviewed and are negative.  Physical Exam: BP 130/70 mmHg  Pulse 82  Ht 5' 1.5" (1.562 m)  Wt 188 lb (85.276 kg)  BMI 34.95 kg/m2 She is an obese white female in no distress. Her HEENT exam is unremarkable. She has no JVD or bruits. Lungs are clear. Cardiac exam reveals a harsh grade 3/6 systolic murmur at the  right upper sternal border radiating to the apex. There is no S3. Abdomen is soft and nontender. She has no edema. Pedal pulses are excellent. Neurologic exam is nonfocal. Body mass index is 34.95 kg/(m^2).   LABORATORY DATA: Echo: 03/05/14:Transthoracic Echocardiography  Patient:  Melinda, Gonzales MR #:    [id number] Study Date: 03/05/2014 Gender:   F Age:    25 Height:   157.5 cm Weight:   85.7 kg BSA:    1.98 m^2 Pt. Status: Room:  ATTENDING  Melinda Gonzales, M.D. ORDERING   Melinda Gonzales, M.D. REFERRING  Melinda Gonzales, M.D. SONOGRAPHER Melinda Gonzales, RDCS PERFORMING  Melinda Gonzales, Outpatient  cc:  ------------------------------------------------------------------- LV EF: 65% -  70%  ------------------------------------------------------------------- Indications:   SOB (R06.02). Murmur (R06.00).  ------------------------------------------------------------------- History:  PMH: Acquired from the patient and from the patient&'s chart. Dyspnea and Harsh 3/6 systolic murmur RUSB radiating to the apex. Risk factors: Former tobacco use. Hypertension. Obese.  ------------------------------------------------------------------- Study Conclusions  - Left ventricle: The cavity size was normal. There was severe concentric hypertrophy.  Systolic function was vigorous. The estimated ejection fraction was in the range of 65% to 70%. Wall motion was normal; there were no regional wall motion abnormalities. Features are consistent with a pseudonormal left ventricular filling pattern, with concomitant abnormal relaxation and increased filling pressure (grade 2 diastolic dysfunction). Doppler parameters are consistent with elevated ventricular end-diastolic filling pressure. - Aortic valve: Trileaflet; mildly thickened, mildly calcified leaflets. There was mild regurgitation. - Aortic root: The aortic root was normal in size. - Mitral valve: Structurally normal valve. There was mild regurgitation. - Left atrium: The atrium was mildly dilated. - Right ventricle: The cavity size was normal. Wall thickness was normal. Systolic function was normal. - Right atrium: The atrium was normal in size. - Pulmonic valve: There was trivial regurgitation. - Pulmonary arteries: Systolic pressure was within the normal range. PA peak pressure: 36 mm Hg (S). - Inferior vena cava: The vessel was normal in size. - Pericardium, extracardiac: There was no pericardial effusion.  Impressions:  - There is severe left ventricular hypertrophy with systolic near cavity obliteration and LVOT gradient of 112 mmHg at rest. LVEF is hyperdynamic. There is systolic anterior motion of the mitral valve leaflet. Aortic valve is poorly visualized but appears mildly thickened and calcified, transaortic gradient couldn&'t be assessed sec to significant LVOT gradient. There is mild aortic insufficiency.  A cardiac MRI should be considered for evaluation of hypertrophic cardiomyopathy.  ------------------------------------------------------------------- Labs, prior tests, procedures, and surgery: Echocardiography (October 2011).   EF was 70%.  Transthoracic echocardiography. M-mode, complete 2D,  spectral Doppler, and color Doppler. Birthdate: Patient birthdate: 05/07/1934. Age: Patient is 80 yr old. Sex: Gender: female. BMI: 34.6 kg/m^2. Blood pressure:   120/68 Patient status: Outpatient. Study date: Study date: 03/05/2014. Study time: 09:43 AM. Location: [facility]  -------------------------------------------------------------------  ------------------------------------------------------------------- Left ventricle: The cavity size was normal. There was severe concentric hypertrophy. Systolic function was vigorous. The estimated ejection fraction was in the range of 65% to 70%. Wall motion was normal; there were no regional wall motion abnormalities. Features are consistent with a pseudonormal left ventricular filling pattern, with concomitant abnormal relaxation and increased filling pressure (grade 2 diastolic dysfunction). Doppler parameters are consistent with elevated ventricular end-diastolic filling pressure.  ------------------------------------------------------------------- Aortic valve:  Trileaflet; mildly thickened, mildly calcified leaflets. Mobility was not restricted. Doppler: Transvalvular velocity couldn&'t be assessed. There was mild regurgitation.  ------------------------------------------------------------------- Aorta: Aortic root: The aortic root was normal in size.  ------------------------------------------------------------------- Mitral valve:  Structurally normal valve.  Mobility was not restricted. Doppler: Transvalvular velocity was within the normal range. There was no evidence for stenosis. There was mild regurgitation.  Peak gradient (D): 5 mm Hg.  ------------------------------------------------------------------- Left atrium: The atrium was mildly dilated.  ------------------------------------------------------------------- Right ventricle: The cavity size was normal. Wall thickness was normal.  Systolic function was normal.  ------------------------------------------------------------------- Pulmonic valve:  Structurally normal valve.  Cusp separation was normal. Doppler: Transvalvular velocity was within the normal range. There was no evidence for stenosis. There was trivial regurgitation.  ------------------------------------------------------------------- Tricuspid valve:  Structurally normal valve.  Doppler: Transvalvular velocity was  within the normal range. There was mild regurgitation.  ------------------------------------------------------------------- Pulmonary artery:  The main pulmonary artery was normal-sized. Systolic pressure was within the normal range.  ------------------------------------------------------------------- Right atrium: The atrium was normal in size.  ------------------------------------------------------------------- Pericardium: There was no pericardial effusion.  ------------------------------------------------------------------- Systemic veins: Inferior vena cava: The vessel was normal in size.  ------------------------------------------------------------------- Measurements  Left ventricle             Value    Reference LV ID, ED, PLAX chordal    (L)   37.6 mm   43 - 52 LV ID, ES, PLAX chordal    (L)   20.7 mm   23 - 38 LV fx shortening, PLAX chordal     45  %   >=29 LV PW thickness, ED          14.9 mm   --------- IVS/LV PW ratio, ED      (H)   1.38     <=1.3 LV e&', lateral             5.15 cm/s  --------- LV E/e&', lateral            21.94    --------- LV e&', medial             3.73 cm/s  --------- LV E/e&', medial            30.29    --------- LV e&', average             4.44 cm/s  --------- LV E/e&', average            25.45     ---------  Ventricular septum           Value    Reference IVS thickness, ED           20.5 mm   ---------  Aorta                 Value    Reference Aortic root ID, ED           27  mm   --------- Ascending aorta ID, A-P, S       33  mm   ---------  Left atrium              Value    Reference LA ID, A-P, ES             41  mm   --------- LA ID/bsa, A-P             2.08 cm/m^2 <=2.2 LA volume, S              72  ml   --------- LA volume/bsa, S            36.5 ml/m^2 --------- LA volume, ES, 1-p A4C         73  ml   --------- LA volume/bsa, ES, 1-p A4C       37  ml/m^2 --------- LA volume, ES, 1-p A2C         69  ml   --------- LA volume/bsa, ES, 1-p A2C       34.9 ml/m^2 ---------  Mitral valve              Value    Reference Mitral E-wave peak velocity      113  cm/s  --------- Mitral A-wave peak velocity      65.6 cm/s  --------- Mitral deceleration time    (H)  303  ms   150 - 230 Mitral peak gradient, D        5   mm Hg --------- Mitral E/A ratio, peak         1.7     ---------  Pulmonary arteries           Value    Reference PA pressure, S, DP       (H)   36  mm Hg <=30  Tricuspid valve            Value    Reference Tricuspid regurg peak velocity     288  cm/s  --------- Tricuspid peak RV-RA gradient     33  mm Hg ---------  Systemic veins             Value    Reference Estimated CVP             3   mm Hg ---------  Right ventricle            Value    Reference RV pressure, S, DP       (H)   36  mm Hg <=30 RV s&', lateral, S           14.8 cm/s   ---------  Legend: (L) and (H) mark values outside specified reference range.  ------------------------------------------------------------------- Prepared and Electronically Authenticated by  Ena Dawley, M.D. [id number]:52:12   CARDIAC MRI  TECHNIQUE: The patient was scanned on a 1.5 Tesla GE magnet. A dedicated cardiac coil was used. Functional imaging was done using Fiesta sequences. 2,3, and 4 chamber views were done to assess for RWMA's. Modified Simpson's rule using a short axis stack was used to calculate an ejection fraction on a dedicated work Conservation officer, nature. The patient received 30 cc of Multihance. After 10 minutes inversion recovery sequences were used to assess for infiltration and scar tissue.  CONTRAST: 30 cc of Multihance  FINDINGS: 1. Normal left ventricular size with moderate left ventricular hypertrophy with severe hypertrophy of the basal septum and normal systolic function (LVEF = 63%). There are no regional wall motion abnormalities.  There is systolic anterior motion of the anterior leaflet of the mitral valve associated with LVOT gradient.  The measurements are as follows:  LVEDD: 43 mm  LVESD: 29 mm  Basal anteroseptal wall: 20 mm  Basal inferolateral wall: 12 mm  Mid anteroseptal wall: 13 mm  Apical septal wall: 12 mm  Apical lateral wall 11 mm  LVEDV: 95 mm  LVESV: 35 ml  SV: 60 ml  CO: 3.8 L/minute  Myocardial mass: 162 g  2. Normal right ventricular size, thickness and systolic function (RVEF = 56%). There are no regional wall motion abnormalities.  RVEDV: 88 ml  RVESV: 39 ml  3. Moderate mitral regurgitation with centrally directed jet.  Trivial aortic insufficiency.  4. Mildly dilated left atrium.  5. Normal caliber of the aortic root, aorta and pulmonary artery.  6. There is subendocardial late gadolinium enhancement in the basal inferior  and inferolateral walls.  IMPRESSION: 1. Normal left ventricular size with small LV cavity. There is moderate left ventricular hypertrophy with severe hypertrophy of the basal septum and normal systolic function (LVEF = 63%). There are no regional wall motion abnormalities.  There is systolic anterior motion of the anterior leaflet of the mitral valve associated with LVOT gradient.  Conclusively, these findings are consistent with hypertrophic cardiomyopathy with no late gadolinium enhancement found the in the anteroseptal  wall.  2. Normal right ventricular size, thickness and systolic function (RVEF = 56%). There are no regional wall motion abnormalities.  3. Moderate mitral regurgitation with centrally directed jet. Trivial aortic insufficiency.  4. Mildly dilated left atrium.  5. There is subendocardial late gadolinium enhancement in the basal inferior and inferolateral walls suggestive of prior subendocardial infarct.  Ena Dawley   Electronically Signed  By: Ena Dawley  On: 03/26/2014 21:02  24 hr. Holter monitor: Occasional PAC,  PVC. One 5 beat run of PAT. One 9 beat run of NSVT.   Assessment / Plan: 1. HOCM. Classic morphologic features by Echo and MRI. MRI also shows some subendocardial scar consistent with HCM.  We had a long discussion today explaining what HOCM is and its treatment. Our goal is to try and reduce her LVOT gradient and promote myocardial relaxation to help improve her symptoms. Will increase Toprol XL to 100 mg daily. Continue low dose lasix and potassium. Check BMET. Will follow up in 6 weeks. Consider adding diltiazem if needed. Encourage weight loss. Once on optimal medical therapy will repeat Echo to reassess gradient and MR. We also discussed screening of family members for this condition.   2. Murmur. Due to LVOT gradient. Also some MR.   3. Palpitations. Holter results as noted. Short run of NSVT. Will push beta blocker  and follow. If she develops syncope or presyncope she will need to see EP.  4. Obesity.  5. Hypercholesterolemia.

## 2014-06-03 ENCOUNTER — Ambulatory Visit: Payer: PRIVATE HEALTH INSURANCE | Admitting: Cardiology

## 2014-06-05 ENCOUNTER — Ambulatory Visit (INDEPENDENT_AMBULATORY_CARE_PROVIDER_SITE_OTHER): Payer: Medicare Other | Admitting: Cardiology

## 2014-06-05 ENCOUNTER — Encounter: Payer: Self-pay | Admitting: Cardiology

## 2014-06-05 VITALS — BP 144/78 | HR 68 | Ht 62.0 in | Wt 185.0 lb

## 2014-06-05 DIAGNOSIS — I472 Ventricular tachycardia: Secondary | ICD-10-CM | POA: Diagnosis not present

## 2014-06-05 DIAGNOSIS — R0602 Shortness of breath: Secondary | ICD-10-CM | POA: Diagnosis not present

## 2014-06-05 DIAGNOSIS — I4729 Other ventricular tachycardia: Secondary | ICD-10-CM

## 2014-06-05 DIAGNOSIS — I421 Obstructive hypertrophic cardiomyopathy: Secondary | ICD-10-CM | POA: Diagnosis not present

## 2014-06-05 NOTE — Patient Instructions (Signed)
Continue your current therapy and your walking program  I will see you in 3 months.

## 2014-06-05 NOTE — Progress Notes (Signed)
Melinda Gonzales Date of Birth: 18-Jan-1935   History of Present Illness: [name] is seen for follow up HOCM.  She presented in February 2016 with symptoms of increased dyspnea on exertion.  She was evaluated in 2011 with a Myoview stress test which was normal. She had an Echo at that time that showed moderate LVH with EF 65-70%. Mild MR. She does have a history of murmur. She is not a smoker. On recent evaluation we repeated an Echo and also did a cardiac MRI. These with both consistent with HOCM. She was started on low dose lasix and Toprol XL 50 mg daily. Toprol was increased on last visit. On follow up today she is doing better. She is able to walk about 1.5 miles to church daily. She has to stop once to catch her breath. She did have an episode of acute SOB one week ago while at [employer] and she states she was hyperventilating and had to sit on the side of her bed. These symptoms resolved.   No dizziness or syncope. No known family history of HCM. Her father died of an MI at age 46. Mother died of CHF at age 57. No family history of sudden death.  Current Outpatient Prescriptions on File Prior to Visit  Medication Sig Dispense Refill  . furosemide (LASIX) 20 MG tablet Take 1 tablet (20 mg total) by mouth daily. 30 tablet 6  . metoprolol succinate (TOPROL-XL) 100 MG 24 hr tablet Take 1 tablet (100 mg total) by mouth daily. Take with or immediately following a meal. 30 tablet 6  . potassium chloride SA (K-DUR,KLOR-CON) 20 MEQ tablet Take 1 tablet (20 mEq total) by mouth daily. 30 tablet 6  . rosuvastatin (CRESTOR) 5 MG tablet Take 5 mg by mouth daily.       No current facility-administered medications on file prior to visit.    No Known Allergies  Past Medical History  Diagnosis Date  . Hypercholesterolemia   . Dyspnea   . Heart murmur   . LVH (left ventricular hypertrophy)     WITH NORMAL SYSTOLIC FUNCTION  . SOB (shortness of breath)   . Hypertrophic obstructive  cardiomyopathy (HOCM)     History reviewed. No pertinent past surgical history.  History  Smoking status  . Former Smoker  . Quit date: 05/26/1965  Smokeless tobacco  . Not on file    History  Alcohol Use No    Family History  Problem Relation Age of Onset  . Heart failure Mother 60  . Heart attack Father 7    Review of Systems: As noted in HPI.   All other systems were reviewed and are negative.  Physical Exam: BP 144/78 mmHg  Pulse 68  Ht 5\' 2"  (1.575 m)  Wt 185 lb (83.915 kg)  BMI 33.83 kg/m2 She is an obese white female in no distress. Her HEENT exam is unremarkable. She has no JVD or bruits. Lungs are clear. Cardiac exam reveals a harsh grade 3-3/2 systolic murmur at the  right upper sternal border radiating to the apex. There is no S3. Abdomen is soft and nontender. She has no edema. Pedal pulses are excellent. Neurologic exam is nonfocal. Body mass index is 33.83 kg/(m^2).   LABORATORY DATA: Echo: 03/05/14:Transthoracic Echocardiography  Patient:  Melinda, Gonzales MR #:    [id number] Study Date: 03/05/2014 Gender:   F Age:    45 Height:   157.5 cm Weight:   85.7 kg BSA:  1.98 m^2 Pt. Status: Room:  ATTENDING  Melinda Gonzales, M.D. ORDERING   Melinda Gonzales, M.D. REFERRING  Melinda Gonzales, M.D. SONOGRAPHER Melinda Gonzales, RDCS PERFORMING  [facility], Outpatient  cc:  ------------------------------------------------------------------- LV EF: 65% -  70%  ------------------------------------------------------------------- Indications:   SOB (R06.02). Murmur (R06.00).  ------------------------------------------------------------------- History:  PMH: Acquired from the patient and from the patient&'s chart. Dyspnea and Harsh 3/6 systolic murmur RUSB radiating to the apex. Risk factors: Former tobacco use. Hypertension. Obese.  ------------------------------------------------------------------- Study Conclusions  -  Left ventricle: The cavity size was normal. There was severe concentric hypertrophy. Systolic function was vigorous. The estimated ejection fraction was in the range of 65% to 70%. Wall motion was normal; there were no regional wall motion abnormalities. Features are consistent with a pseudonormal left ventricular filling pattern, with concomitant abnormal relaxation and increased filling pressure (grade 2 diastolic dysfunction). Doppler parameters are consistent with elevated ventricular end-diastolic filling pressure. - Aortic valve: Trileaflet; mildly thickened, mildly calcified leaflets. There was mild regurgitation. - Aortic root: The aortic root was normal in size. - Mitral valve: Structurally normal valve. There was mild regurgitation. - Left atrium: The atrium was mildly dilated. - Right ventricle: The cavity size was normal. Wall thickness was normal. Systolic function was normal. - Right atrium: The atrium was normal in size. - Pulmonic valve: There was trivial regurgitation. - Pulmonary arteries: Systolic pressure was within the normal range. PA peak pressure: 36 mm Hg (S). - Inferior vena cava: The vessel was normal in size. - Pericardium, extracardiac: There was no pericardial effusion.  Impressions:  - There is severe left ventricular hypertrophy with systolic near cavity obliteration and LVOT gradient of 112 mmHg at rest. LVEF is hyperdynamic. There is systolic anterior motion of the mitral valve leaflet. Aortic valve is poorly visualized but appears mildly thickened and calcified, transaortic gradient couldn&'t be assessed sec to significant LVOT gradient. There is mild aortic insufficiency.  A cardiac MRI should be considered for evaluation of hypertrophic cardiomyopathy.  ------------------------------------------------------------------- Labs, prior tests, procedures, and surgery: Echocardiography (October 2011).    EF was 70%.  Transthoracic echocardiography. M-mode, complete 2D, spectral Doppler, and color Doppler. Birthdate: Patient birthdate: 01-30-1935. Age: Patient is 80 yr old. Sex: Gender: female. BMI: 34.6 kg/m^2. Blood pressure:   120/68 Patient status: Outpatient. Study date: Study date: 03/05/2014. Study time: 09:43 AM. Location: [facility]  -------------------------------------------------------------------  ------------------------------------------------------------------- Left ventricle: The cavity size was normal. There was severe concentric hypertrophy. Systolic function was vigorous. The estimated ejection fraction was in the range of 65% to 70%. Wall motion was normal; there were no regional wall motion abnormalities. Features are consistent with a pseudonormal left ventricular filling pattern, with concomitant abnormal relaxation and increased filling pressure (grade 2 diastolic dysfunction). Doppler parameters are consistent with elevated ventricular end-diastolic filling pressure.  ------------------------------------------------------------------- Aortic valve:  Trileaflet; mildly thickened, mildly calcified leaflets. Mobility was not restricted. Doppler: Transvalvular velocity couldn&'t be assessed. There was mild regurgitation.  ------------------------------------------------------------------- Aorta: Aortic root: The aortic root was normal in size.  ------------------------------------------------------------------- Mitral valve:  Structurally normal valve.  Mobility was not restricted. Doppler: Transvalvular velocity was within the normal range. There was no evidence for stenosis. There was mild regurgitation.  Peak gradient (D): 5 mm Hg.  ------------------------------------------------------------------- Left atrium: The atrium was mildly dilated.  ------------------------------------------------------------------- Right  ventricle: The cavity size was normal. Wall thickness was normal. Systolic function was normal.  ------------------------------------------------------------------- Pulmonic valve:  Structurally normal valve.  Cusp separation was normal. Doppler: Transvalvular velocity  was within the normal range. There was no evidence for stenosis. There was trivial regurgitation.  ------------------------------------------------------------------- Tricuspid valve:  Structurally normal valve.  Doppler: Transvalvular velocity was within the normal range. There was mild regurgitation.  ------------------------------------------------------------------- Pulmonary artery:  The main pulmonary artery was normal-sized. Systolic pressure was within the normal range.  ------------------------------------------------------------------- Right atrium: The atrium was normal in size.  ------------------------------------------------------------------- Pericardium: There was no pericardial effusion.  ------------------------------------------------------------------- Systemic veins: Inferior vena cava: The vessel was normal in size.  ------------------------------------------------------------------- Measurements  Left ventricle             Value    Reference LV ID, ED, PLAX chordal    (L)   37.6 mm   43 - 52 LV ID, ES, PLAX chordal    (L)   20.7 mm   23 - 38 LV fx shortening, PLAX chordal     45  %   >=29 LV PW thickness, ED          14.9 mm   --------- IVS/LV PW ratio, ED      (H)   1.38     <=1.3 LV e&', lateral             5.15 cm/s  --------- LV E/e&', lateral            21.94    --------- LV e&', medial             3.73 cm/s  --------- LV E/e&', medial            30.29    --------- LV e&', average             4.44 cm/s  --------- LV  E/e&', average            25.45    ---------  Ventricular septum           Value    Reference IVS thickness, ED           20.5 mm   ---------  Aorta                 Value    Reference Aortic root ID, ED           27  mm   --------- Ascending aorta ID, A-P, S       33  mm   ---------  Left atrium              Value    Reference LA ID, A-P, ES             41  mm   --------- LA ID/bsa, A-P             2.08 cm/m^2 <=2.2 LA volume, S              72  ml   --------- LA volume/bsa, S            36.5 ml/m^2 --------- LA volume, ES, 1-p A4C         73  ml   --------- LA volume/bsa, ES, 1-p A4C       37  ml/m^2 --------- LA volume, ES, 1-p A2C         69  ml   --------- LA volume/bsa, ES, 1-p A2C       34.9 ml/m^2 ---------  Mitral valve              Value    Reference Mitral E-wave peak velocity  113  cm/s  --------- Mitral A-wave peak velocity      65.6 cm/s  --------- Mitral deceleration time    (H)   303  ms   150 - 230 Mitral peak gradient, D        5   mm Hg --------- Mitral E/A ratio, peak         1.7     ---------  Pulmonary arteries           Value    Reference PA pressure, S, DP       (H)   36  mm Hg <=30  Tricuspid valve            Value    Reference Tricuspid regurg peak velocity     288  cm/s  --------- Tricuspid peak RV-RA gradient     33  mm Hg ---------  Systemic veins             Value    Reference Estimated CVP             3   mm Hg ---------  Right ventricle            Value    Reference RV pressure, S, DP       (H)   36  mm Hg <=30 RV s&', lateral, S            14.8 cm/s  ---------  Legend: (L) and (H) mark values outside specified reference range.  ------------------------------------------------------------------- Prepared and Electronically Authenticated by  Ena Dawley, M.D. [id number]:52:12   CARDIAC MRI  TECHNIQUE: The patient was scanned on a 1.5 Tesla GE magnet. A dedicated cardiac coil was used. Functional imaging was done using [employer] sequences. 2,3, and 4 chamber views were done to assess for RWMA's. Modified Simpson's rule using a short axis stack was used to calculate an ejection fraction on a dedicated work Conservation officer, nature. The patient received 30 cc of Multihance. After 10 minutes inversion recovery sequences were used to assess for infiltration and scar tissue.  CONTRAST: 30 cc of Multihance  FINDINGS: 1. Normal left ventricular size with moderate left ventricular hypertrophy with severe hypertrophy of the basal septum and normal systolic function (LVEF = 63%). There are no regional wall motion abnormalities.  There is systolic anterior motion of the anterior leaflet of the mitral valve associated with LVOT gradient.  The measurements are as follows:  LVEDD: 43 mm  LVESD: 29 mm  Basal anteroseptal wall: 20 mm  Basal inferolateral wall: 12 mm  Mid anteroseptal wall: 13 mm  Apical septal wall: 12 mm  Apical lateral wall 11 mm  LVEDV: 95 mm  LVESV: 35 ml  SV: 60 ml  CO: 3.8 L/minute  Myocardial mass: 162 g  2. Normal right ventricular size, thickness and systolic function (RVEF = 56%). There are no regional wall motion abnormalities.  RVEDV: 88 ml  RVESV: 39 ml  3. Moderate mitral regurgitation with centrally directed jet.  Trivial aortic insufficiency.  4. Mildly dilated left atrium.  5. Normal caliber of the aortic root, aorta and pulmonary artery.  6. There is subendocardial late gadolinium  enhancement in the basal inferior and inferolateral walls.  IMPRESSION: 1. Normal left ventricular size with small LV cavity. There is moderate left ventricular hypertrophy with severe hypertrophy of the basal septum and normal systolic function (LVEF = 63%). There are no regional wall motion abnormalities.  There is systolic anterior motion of the anterior leaflet  of the mitral valve associated with LVOT gradient.  Conclusively, these findings are consistent with hypertrophic cardiomyopathy with no late gadolinium enhancement found the in the anteroseptal wall.  2. Normal right ventricular size, thickness and systolic function (RVEF = 56%). There are no regional wall motion abnormalities.  3. Moderate mitral regurgitation with centrally directed jet. Trivial aortic insufficiency.  4. Mildly dilated left atrium.  5. There is subendocardial late gadolinium enhancement in the basal inferior and inferolateral walls suggestive of prior subendocardial infarct.  Ena Dawley   Electronically Signed  By: Ena Dawley  On: 03/26/2014 21:02  24 hr. Holter monitor: Occasional PAC,  PVC. One 5 beat run of PAT. One 9 beat run of NSVT.   Lab Results  Component Value Date   GLUCOSE 103* 04/23/2014   CHOL 168 03/09/2014   TRIG 157.0* 03/09/2014   HDL 50.80 03/09/2014   LDLCALC 86 03/09/2014   ALT 7 03/09/2014   AST 14 03/09/2014   NA 139 04/23/2014   K 5.0 04/23/2014   CL 105 04/23/2014   CREATININE 1.01 04/23/2014   BUN 26* 04/23/2014   CO2 20 04/23/2014    Assessment / Plan: 1. HOCM. Classic morphologic features by Echo and MRI. MRI also shows some subendocardial scar consistent with HCM.  Clinically improved on increased metoprolol.  Continue low dose lasix and potassium. Will continue current therapy for now. If increased dyspnea we could add diltiazem.  Once on optimal medical therapy may repeat Echo to reassess gradient and MR. Currently has at most  class 2 symptoms so would not recommend Myectomy or septal ablation at this point given her age. I will follow up in 3 months.  2. Murmur. Due to LVOT gradient. Also some MR.   3. Palpitations. Holter results as noted. Short run of NSVT. Will push beta blocker and follow. If she develops syncope or presyncope she will need to see EP.  4. Obesity.  5. Hypercholesterolemia.

## 2014-08-05 DIAGNOSIS — M170 Bilateral primary osteoarthritis of knee: Secondary | ICD-10-CM | POA: Diagnosis not present

## 2014-08-27 DIAGNOSIS — W57XXXA Bitten or stung by nonvenomous insect and other nonvenomous arthropods, initial encounter: Secondary | ICD-10-CM | POA: Diagnosis not present

## 2014-08-27 DIAGNOSIS — Z85828 Personal history of other malignant neoplasm of skin: Secondary | ICD-10-CM | POA: Diagnosis not present

## 2014-09-04 DIAGNOSIS — M170 Bilateral primary osteoarthritis of knee: Secondary | ICD-10-CM | POA: Diagnosis not present

## 2014-09-16 ENCOUNTER — Ambulatory Visit: Payer: Medicare Other | Admitting: Cardiology

## 2014-09-22 ENCOUNTER — Encounter: Payer: Self-pay | Admitting: Cardiology

## 2014-09-22 ENCOUNTER — Ambulatory Visit (INDEPENDENT_AMBULATORY_CARE_PROVIDER_SITE_OTHER): Payer: Medicare Other | Admitting: Cardiology

## 2014-09-22 VITALS — BP 118/62 | HR 72 | Ht 61.0 in | Wt 177.1 lb

## 2014-09-22 DIAGNOSIS — I421 Obstructive hypertrophic cardiomyopathy: Secondary | ICD-10-CM | POA: Diagnosis not present

## 2014-09-22 DIAGNOSIS — I472 Ventricular tachycardia: Secondary | ICD-10-CM | POA: Diagnosis not present

## 2014-09-22 DIAGNOSIS — E7800 Pure hypercholesterolemia, unspecified: Secondary | ICD-10-CM

## 2014-09-22 DIAGNOSIS — R0602 Shortness of breath: Secondary | ICD-10-CM

## 2014-09-22 DIAGNOSIS — I4729 Other ventricular tachycardia: Secondary | ICD-10-CM

## 2014-09-22 NOTE — Patient Instructions (Signed)
Continue your current therapy  I will see you in 6 months.   

## 2014-09-22 NOTE — Progress Notes (Signed)
Melinda Gonzales Date of Birth: 18-Nov-1934   History of Present Illness: [name] is seen for follow up HOCM.  She presented in February 2016 with symptoms of increased dyspnea on exertion.  She was evaluated in 2011 with a Myoview stress test which was normal. She had an Echo at that time that showed moderate LVH with EF 65-70%. Mild MR. She does have a history of murmur. She is not a smoker. On recent evaluation we repeated an Echo and also did a cardiac MRI. These with both consistent with HOCM. She was started on low dose lasix and Toprol X.  On follow up today she is doing better. She reports her breathing is much better. She has lost 8 lbs since last visit and 12 lbs since February.   No dizziness or syncope. she is primarily limited by arthritis in her knees now. Had cortisol injection without improvement.   Current Outpatient Prescriptions on File Prior to Visit  Medication Sig Dispense Refill  . furosemide (LASIX) 20 MG tablet Take 1 tablet (20 mg total) by mouth daily. 30 tablet 6  . metoprolol succinate (TOPROL-XL) 100 MG 24 hr tablet Take 1 tablet (100 mg total) by mouth daily. Take with or immediately following a meal. 30 tablet 6  . potassium chloride SA (K-DUR,KLOR-CON) 20 MEQ tablet Take 1 tablet (20 mEq total) by mouth daily. 30 tablet 6  . rosuvastatin (CRESTOR) 5 MG tablet Take 5 mg by mouth daily.       No current facility-administered medications on file prior to visit.    No Known Allergies  Past Medical History  Diagnosis Date  . Hypercholesterolemia   . Dyspnea   . Heart murmur   . LVH (left ventricular hypertrophy)     WITH NORMAL SYSTOLIC FUNCTION  . SOB (shortness of breath)   . Hypertrophic obstructive cardiomyopathy (HOCM)     History reviewed. No pertinent past surgical history.  History  Smoking status  . Former Smoker  . Quit date: 05/26/1965  Smokeless tobacco  . Not on file    History  Alcohol Use No    Family History  Problem  Relation Age of Onset  . Heart failure Mother 76  . Heart attack Father 24    Review of Systems: As noted in HPI.   All other systems were reviewed and are negative.  Physical Exam: BP 118/62 mmHg  Pulse 72  Ht 5\' 1"  (1.549 m)  Wt 80.332 kg (177 lb 1.6 oz)  BMI 33.48 kg/m2 She is an obese white female in no distress. Her HEENT exam is unremarkable. She has no JVD or bruits. Lungs are clear. Cardiac exam reveals a harsh grade 6-2/9 systolic murmur at the  right upper sternal border radiating to the apex. There is no S3. Abdomen is soft and nontender. She has no edema. Pedal pulses are excellent. Neurologic exam is nonfocal. Body mass index is 33.48 kg/(m^2).   LABORATORY DATA: Echo: 03/05/14:Transthoracic Echocardiography  Patient:  Melinda, Gonzales MR #:    [id number] Study Date: 03/05/2014 Gender:   F Age:    80 Height:   157.5 cm Weight:   85.7 kg BSA:    1.98 m^2 Pt. Status: Room:  ATTENDING  Rishawn Walck [place], M.D. ORDERING   Aztlan Coll [place], M.D. REFERRING  Khameron Gruenwald [place], M.D. SONOGRAPHER Victorio Palm, RDCS PERFORMING  [facility], Outpatient  cc:  ------------------------------------------------------------------- LV EF: 65% -  70%  ------------------------------------------------------------------- Indications:   SOB (R06.02). Murmur (R06.00).  ------------------------------------------------------------------- History:  PMH: Acquired from the patient and from the patient&'s chart. Dyspnea and Harsh 3/6 systolic murmur RUSB radiating to the apex. Risk factors: Former tobacco use. Hypertension. Obese.  ------------------------------------------------------------------- Study Conclusions  - Left ventricle: The cavity size was normal. There was severe concentric hypertrophy. Systolic function was vigorous. The estimated ejection fraction was in the range of 65% to 70%. Wall motion was normal; there were no regional wall  motion abnormalities. Features are consistent with a pseudonormal left ventricular filling pattern, with concomitant abnormal relaxation and increased filling pressure (grade 2 diastolic dysfunction). Doppler parameters are consistent with elevated ventricular end-diastolic filling pressure. - Aortic valve: Trileaflet; mildly thickened, mildly calcified leaflets. There was mild regurgitation. - Aortic root: The aortic root was normal in size. - Mitral valve: Structurally normal valve. There was mild regurgitation. - Left atrium: The atrium was mildly dilated. - Right ventricle: The cavity size was normal. Wall thickness was normal. Systolic function was normal. - Right atrium: The atrium was normal in size. - Pulmonic valve: There was trivial regurgitation. - Pulmonary arteries: Systolic pressure was within the normal range. PA peak pressure: 36 mm Hg (S). - Inferior vena cava: The vessel was normal in size. - Pericardium, extracardiac: There was no pericardial effusion.  Impressions:  - There is severe left ventricular hypertrophy with systolic near cavity obliteration and LVOT gradient of 112 mmHg at rest. LVEF is hyperdynamic. There is systolic anterior motion of the mitral valve leaflet. Aortic valve is poorly visualized but appears mildly thickened and calcified, transaortic gradient couldn&'t be assessed sec to significant LVOT gradient. There is mild aortic insufficiency.  A cardiac MRI should be considered for evaluation of hypertrophic cardiomyopathy.  ------------------------------------------------------------------- Labs, prior tests, procedures, and surgery: Echocardiography (October 2011).   EF was 70%.  Transthoracic echocardiography. M-mode, complete 2D, spectral Doppler, and color Doppler. Birthdate: Patient birthdate: 04-20-1934. Age: Patient is 80 yr old. Sex: Gender: female. BMI: 34.6 kg/m^2. Blood pressure:    120/68 Patient status: Outpatient. Study date: Study date: 03/05/2014. Study time: 09:43 AM. Location: [facility]  -------------------------------------------------------------------  ------------------------------------------------------------------- Left ventricle: The cavity size was normal. There was severe concentric hypertrophy. Systolic function was vigorous. The estimated ejection fraction was in the range of 65% to 70%. Wall motion was normal; there were no regional wall motion abnormalities. Features are consistent with a pseudonormal left ventricular filling pattern, with concomitant abnormal relaxation and increased filling pressure (grade 2 diastolic dysfunction). Doppler parameters are consistent with elevated ventricular end-diastolic filling pressure.  ------------------------------------------------------------------- Aortic valve:  Trileaflet; mildly thickened, mildly calcified leaflets. Mobility was not restricted. Doppler: Transvalvular velocity couldn&'t be assessed. There was mild regurgitation.  ------------------------------------------------------------------- Aorta: Aortic root: The aortic root was normal in size.  ------------------------------------------------------------------- Mitral valve:  Structurally normal valve.  Mobility was not restricted. Doppler: Transvalvular velocity was within the normal range. There was no evidence for stenosis. There was mild regurgitation.  Peak gradient (D): 5 mm Hg.  ------------------------------------------------------------------- Left atrium: The atrium was mildly dilated.  ------------------------------------------------------------------- Right ventricle: The cavity size was normal. Wall thickness was normal. Systolic function was normal.  ------------------------------------------------------------------- Pulmonic valve:  Structurally normal valve.  Cusp separation  was normal. Doppler: Transvalvular velocity was within the normal range. There was no evidence for stenosis. There was trivial regurgitation.  ------------------------------------------------------------------- Tricuspid valve:  Structurally normal valve.  Doppler: Transvalvular velocity was within the normal range. There was mild regurgitation.  ------------------------------------------------------------------- Pulmonary artery:  The main pulmonary artery was normal-sized. Systolic pressure was within the normal  range.  ------------------------------------------------------------------- Right atrium: The atrium was normal in size.  ------------------------------------------------------------------- Pericardium: There was no pericardial effusion.  ------------------------------------------------------------------- Systemic veins: Inferior vena cava: The vessel was normal in size.  ------------------------------------------------------------------- Measurements  Left ventricle             Value    Reference LV ID, ED, PLAX chordal    (L)   37.6 mm   43 - 52 LV ID, ES, PLAX chordal    (L)   20.7 mm   23 - 38 LV fx shortening, PLAX chordal     45  %   >=29 LV PW thickness, ED          14.9 mm   --------- IVS/LV PW ratio, ED      (H)   1.38     <=1.3 LV e&', lateral             5.15 cm/s  --------- LV E/e&', lateral            21.94    --------- LV e&', medial             3.73 cm/s  --------- LV E/e&', medial            30.29    --------- LV e&', average             4.44 cm/s  --------- LV E/e&', average            25.45    ---------  Ventricular septum           Value    Reference IVS thickness, ED           20.5 mm   ---------  Aorta                  Value    Reference Aortic root ID, ED           27  mm   --------- Ascending aorta ID, A-P, S       33  mm   ---------  Left atrium              Value    Reference LA ID, A-P, ES             41  mm   --------- LA ID/bsa, A-P             2.08 cm/m^2 <=2.2 LA volume, S              72  ml   --------- LA volume/bsa, S            36.5 ml/m^2 --------- LA volume, ES, 1-p A4C         73  ml   --------- LA volume/bsa, ES, 1-p A4C       37  ml/m^2 --------- LA volume, ES, 1-p A2C         69  ml   --------- LA volume/bsa, ES, 1-p A2C       34.9 ml/m^2 ---------  Mitral valve              Value    Reference Mitral E-wave peak velocity      113  cm/s  --------- Mitral A-wave peak velocity      65.6 cm/s  --------- Mitral deceleration time    (H)   303  ms   150 - 230 Mitral peak gradient, D        5   mm  Hg --------- Mitral E/A ratio, peak         1.7     ---------  Pulmonary arteries           Value    Reference PA pressure, S, DP       (H)   36  mm Hg <=30  Tricuspid valve            Value    Reference Tricuspid regurg peak velocity     288  cm/s  --------- Tricuspid peak RV-RA gradient     33  mm Hg ---------  Systemic veins             Value    Reference Estimated CVP             3   mm Hg ---------  Right ventricle            Value    Reference RV pressure, S, DP       (H)   36  mm Hg <=30 RV s&', lateral, S           14.8 cm/s  ---------  Legend: (L) and (H) mark values outside specified reference range.  ------------------------------------------------------------------- Prepared and Electronically Authenticated  by  Ena Dawley, M.D. 2016-02-04T17:52:12   CARDIAC MRI  TECHNIQUE: The patient was scanned on a 1.5 Tesla GE magnet. A dedicated cardiac coil was used. Functional imaging was done using Fiesta sequences. 2,3, and 4 chamber views were done to assess for RWMA's. Modified Simpson's rule using a short axis stack was used to calculate an ejection fraction on a dedicated work Conservation officer, nature. The patient received 30 cc of Multihance. After 10 minutes inversion recovery sequences were used to assess for infiltration and scar tissue.  CONTRAST: 30 cc of Multihance  FINDINGS: 1. Normal left ventricular size with moderate left ventricular hypertrophy with severe hypertrophy of the basal septum and normal systolic function (LVEF = 63%). There are no regional wall motion abnormalities.  There is systolic anterior motion of the anterior leaflet of the mitral valve associated with LVOT gradient.  The measurements are as follows:  LVEDD: 43 mm  LVESD: 29 mm  Basal anteroseptal wall: 20 mm  Basal inferolateral wall: 12 mm  Mid anteroseptal wall: 13 mm  Apical septal wall: 12 mm  Apical lateral wall 11 mm  LVEDV: 95 mm  LVESV: 35 ml  SV: 60 ml  CO: 3.8 L/minute  Myocardial mass: 162 g  2. Normal right ventricular size, thickness and systolic function (RVEF = 56%). There are no regional wall motion abnormalities.  RVEDV: 88 ml  RVESV: 39 ml  3. Moderate mitral regurgitation with centrally directed jet.  Trivial aortic insufficiency.  4. Mildly dilated left atrium.  5. Normal caliber of the aortic root, aorta and pulmonary artery.  6. There is subendocardial late gadolinium enhancement in the basal inferior and inferolateral walls.  IMPRESSION: 1. Normal left ventricular size with small LV cavity. There is moderate left ventricular hypertrophy with severe hypertrophy of the basal septum and normal  systolic function (LVEF = 63%). There are no regional wall motion abnormalities.  There is systolic anterior motion of the anterior leaflet of the mitral valve associated with LVOT gradient.  Conclusively, these findings are consistent with hypertrophic cardiomyopathy with no late gadolinium enhancement found the in the anteroseptal wall.  2. Normal right ventricular size, thickness and systolic function (RVEF = 56%). There are no regional wall motion abnormalities.  3.  Moderate mitral regurgitation with centrally directed jet. Trivial aortic insufficiency.  4. Mildly dilated left atrium.  5. There is subendocardial late gadolinium enhancement in the basal inferior and inferolateral walls suggestive of prior subendocardial infarct.  Ena Dawley   Electronically Signed  By: Ena Dawley  On: 03/26/2014 21:02  24 hr. Holter monitor: Occasional PAC,  PVC. One 5 beat run of PAT. One 9 beat run of NSVT.   Lab Results  Component Value Date   GLUCOSE 103* 04/23/2014   CHOL 168 03/09/2014   TRIG 157.0* 03/09/2014   HDL 50.80 03/09/2014   LDLCALC 86 03/09/2014   ALT 7 03/09/2014   AST 14 03/09/2014   NA 139 04/23/2014   K 5.0 04/23/2014   CL 105 04/23/2014   CREATININE 1.01 04/23/2014   BUN 26* 04/23/2014   CO2 20 04/23/2014    Assessment / Plan: 1. HOCM. Classic morphologic features by Echo and MRI. MRI also shows some subendocardial scar consistent with HCM.  Clinically improved on increased metoprolol.  Continue low dose lasix and potassium.  Currently has  class 1-2 symptoms so would not recommend Myectomy or septal ablation at this point given her age. I will follow up in 6 months. Consider repeat Echo at that time.  2. Murmur. Due to LVOT gradient. Also some MR.   3. Palpitations. NSVT noted on monitor. Symptoms improved on beta blocker.  If she develops syncope or presyncope she will need to see EP.  4. Obesity.  5. Hypercholesterolemia.

## 2014-10-16 DIAGNOSIS — Z23 Encounter for immunization: Secondary | ICD-10-CM | POA: Diagnosis not present

## 2014-10-20 ENCOUNTER — Other Ambulatory Visit: Payer: Self-pay | Admitting: Cardiology

## 2014-10-20 NOTE — Telephone Encounter (Signed)
Rx request sent to pharmacy.  

## 2014-11-19 ENCOUNTER — Other Ambulatory Visit: Payer: Self-pay | Admitting: Cardiology

## 2015-02-25 DIAGNOSIS — H2513 Age-related nuclear cataract, bilateral: Secondary | ICD-10-CM | POA: Diagnosis not present

## 2015-04-19 DIAGNOSIS — L853 Xerosis cutis: Secondary | ICD-10-CM | POA: Diagnosis not present

## 2015-04-19 DIAGNOSIS — L570 Actinic keratosis: Secondary | ICD-10-CM | POA: Diagnosis not present

## 2015-04-19 DIAGNOSIS — D225 Melanocytic nevi of trunk: Secondary | ICD-10-CM | POA: Diagnosis not present

## 2015-04-19 DIAGNOSIS — D485 Neoplasm of uncertain behavior of skin: Secondary | ICD-10-CM | POA: Diagnosis not present

## 2015-04-19 DIAGNOSIS — L821 Other seborrheic keratosis: Secondary | ICD-10-CM | POA: Diagnosis not present

## 2015-04-19 DIAGNOSIS — Z85828 Personal history of other malignant neoplasm of skin: Secondary | ICD-10-CM | POA: Diagnosis not present

## 2015-04-21 ENCOUNTER — Other Ambulatory Visit: Payer: Self-pay | Admitting: Cardiology

## 2015-04-21 DIAGNOSIS — M545 Low back pain: Secondary | ICD-10-CM | POA: Diagnosis not present

## 2015-04-21 DIAGNOSIS — M25552 Pain in left hip: Secondary | ICD-10-CM | POA: Diagnosis not present

## 2015-04-21 NOTE — Telephone Encounter (Signed)
Rx has been sent to the pharmacy electronically. ° °

## 2015-04-30 DIAGNOSIS — R197 Diarrhea, unspecified: Secondary | ICD-10-CM | POA: Diagnosis not present

## 2015-05-03 DIAGNOSIS — D0439 Carcinoma in situ of skin of other parts of face: Secondary | ICD-10-CM | POA: Diagnosis not present

## 2015-05-03 DIAGNOSIS — D485 Neoplasm of uncertain behavior of skin: Secondary | ICD-10-CM | POA: Diagnosis not present

## 2015-05-03 DIAGNOSIS — L570 Actinic keratosis: Secondary | ICD-10-CM | POA: Diagnosis not present

## 2015-05-03 DIAGNOSIS — Z85828 Personal history of other malignant neoplasm of skin: Secondary | ICD-10-CM | POA: Diagnosis not present

## 2015-05-14 ENCOUNTER — Encounter: Payer: Self-pay | Admitting: Cardiology

## 2015-05-14 ENCOUNTER — Ambulatory Visit (INDEPENDENT_AMBULATORY_CARE_PROVIDER_SITE_OTHER): Payer: Medicare Other | Admitting: Cardiology

## 2015-05-14 VITALS — BP 138/76 | HR 67 | Ht 62.0 in | Wt 174.2 lb

## 2015-05-14 DIAGNOSIS — I472 Ventricular tachycardia: Secondary | ICD-10-CM | POA: Diagnosis not present

## 2015-05-14 DIAGNOSIS — I421 Obstructive hypertrophic cardiomyopathy: Secondary | ICD-10-CM

## 2015-05-14 DIAGNOSIS — E7800 Pure hypercholesterolemia, unspecified: Secondary | ICD-10-CM | POA: Diagnosis not present

## 2015-05-14 DIAGNOSIS — R0602 Shortness of breath: Secondary | ICD-10-CM

## 2015-05-14 DIAGNOSIS — I4729 Other ventricular tachycardia: Secondary | ICD-10-CM

## 2015-05-14 NOTE — Patient Instructions (Signed)
We will schedule you for an Echocardiogram  We will check blood work today  I will call you with the results and decide next steps

## 2015-05-14 NOTE — Progress Notes (Signed)
[name] Date of Birth: [birth year]   History of Present Illness: Mrs. Norma Fredrickson is seen for follow up HOCM.  She presented in February 2016 with symptoms of increased dyspnea on exertion.  She was evaluated in 2011 with a Myoview stress test which was normal. She had an Echo at that time that showed moderate LVH with EF 65-70%. Mild MR. She does have a history of murmur. She is not a smoker. In early 2016 she presented with increased dyspnea and an Echo and cardiac MRI were done. These with both consistent with HOCM. She was started on low dose lasix and Toprol XL with good response initially with improved dyspnea.  On follow up today she reports increased symptoms of dyspnea.  She has lost 3 lbs since last visit.  No dizziness or syncope. Prior palpitations have resolved on Toprol. She is trying to exercise. No chest pain. She does note decline in energy levels.   Current Outpatient Prescriptions on File Prior to Visit  Medication Sig Dispense Refill  . furosemide (LASIX) 20 MG tablet TAKE 1 TABLET BY MOUTH DAILY 30 tablet 6  . metoprolol succinate (TOPROL-XL) 100 MG 24 hr tablet TAKE 1 TABLET BY MOUTH DAILY 30 tablet 11  . potassium chloride SA (K-DUR,KLOR-CON) 20 MEQ tablet TAKE 1 TABLET BY MOUTH DAILY 30 tablet 1  . rosuvastatin (CRESTOR) 5 MG tablet Take 5 mg by mouth daily.       No current facility-administered medications on file prior to visit.    No Known Allergies  Past Medical History  Diagnosis Date  . Hypercholesterolemia   . Dyspnea   . Heart murmur   . LVH (left ventricular hypertrophy)     WITH NORMAL SYSTOLIC FUNCTION  . SOB (shortness of breath)   . Hypertrophic obstructive cardiomyopathy (HOCM) ([place])     History reviewed. No pertinent past surgical history.  History  Smoking status  . Former Smoker  . Quit date: 05/26/1965  Smokeless tobacco  . Not on file    History  Alcohol Use No    Family History  Problem Relation Age of Onset  .  Heart failure Mother 47  . Heart attack Father 2    Review of Systems: As noted in HPI.   All other systems were reviewed and are negative.  Physical Exam: BP 138/76 mmHg  Pulse 67  Ht 5\' 2"  (1.575 m)  Wt 79.017 kg (174 lb 3.2 oz)  BMI 31.85 kg/m2 She is an obese white female in no distress. Her HEENT exam is unremarkable. She has no JVD or bruits. Lungs are clear. Cardiac exam reveals a harsh grade 3/6 systolic murmur at the  right upper sternal border radiating to the apex. There is no S3. Abdomen is soft and nontender. She has no edema. Pedal pulses are excellent. Neurologic exam is nonfocal. Body mass index is 31.85 kg/(m^2).   LABORATORY DATA: Echo: 03/05/14:Transthoracic Echocardiography  Patient:  Kat, Plaza MR #:    [id number] Study Date: 03/05/2014 Gender:   F Age:    81 Height:   157.5 cm Weight:   85.7 kg BSA:    1.98 m^2 Pt. Status: Room:  ATTENDING  Ulus Hazen Martinique, M.D. ORDERING   Omeka Holben [place], M.D. REFERRING  Rebbie Lauricella [place], M.D. SONOGRAPHER Victorio Palm, RDCS PERFORMING  [facility], Outpatient  cc:  ------------------------------------------------------------------- LV EF: 65% -  70%  ------------------------------------------------------------------- Indications:   SOB (R06.02). Murmur (R06.00).  ------------------------------------------------------------------- History:  PMH: Acquired from the patient and from the  patient&'s chart. Dyspnea and Harsh 3/6 systolic murmur RUSB radiating to the apex. Risk factors: Former tobacco use. Hypertension. Obese.  ------------------------------------------------------------------- Study Conclusions  - Left ventricle: The cavity size was normal. There was severe concentric hypertrophy. Systolic function was vigorous. The estimated ejection fraction was in the range of 65% to 70%. Wall motion was normal; there were no regional wall motion abnormalities. Features  are consistent with a pseudonormal left ventricular filling pattern, with concomitant abnormal relaxation and increased filling pressure (grade 2 diastolic dysfunction). Doppler parameters are consistent with elevated ventricular end-diastolic filling pressure. - Aortic valve: Trileaflet; mildly thickened, mildly calcified leaflets. There was mild regurgitation. - Aortic root: The aortic root was normal in size. - Mitral valve: Structurally normal valve. There was mild regurgitation. - Left atrium: The atrium was mildly dilated. - Right ventricle: The cavity size was normal. Wall thickness was normal. Systolic function was normal. - Right atrium: The atrium was normal in size. - Pulmonic valve: There was trivial regurgitation. - Pulmonary arteries: Systolic pressure was within the normal range. PA peak pressure: 36 mm Hg (S). - Inferior vena cava: The vessel was normal in size. - Pericardium, extracardiac: There was no pericardial effusion.  Impressions:  - There is severe left ventricular hypertrophy with systolic near cavity obliteration and LVOT gradient of 112 mmHg at rest. LVEF is hyperdynamic. There is systolic anterior motion of the mitral valve leaflet. Aortic valve is poorly visualized but appears mildly thickened and calcified, transaortic gradient couldn&'t be assessed sec to significant LVOT gradient. There is mild aortic insufficiency.  A cardiac MRI should be considered for evaluation of hypertrophic cardiomyopathy.  ------------------------------------------------------------------- Labs, prior tests, procedures, and surgery: Echocardiography (October 2011).   EF was 70%.  Transthoracic echocardiography. M-mode, complete 2D, spectral Doppler, and color Doppler. Birthdate: Patient birthdate: 04-01-1934. Age: Patient is 81 yr old. old. Sex: Gender: female. BMI: 34.6 kg/m^2. Blood pressure:   120/68 Patient  status: Outpatient. Study date: Study date: 03/05/2014. Study time: 09:43 AM. Location: [facility]  -------------------------------------------------------------------  ------------------------------------------------------------------- Left ventricle: The cavity size was normal. There was severe concentric hypertrophy. Systolic function was vigorous. The estimated ejection fraction was in the range of 65% to 70%. Wall motion was normal; there were no regional wall motion abnormalities. Features are consistent with a pseudonormal left ventricular filling pattern, with concomitant abnormal relaxation and increased filling pressure (grade 2 diastolic dysfunction). Doppler parameters are consistent with elevated ventricular end-diastolic filling pressure.  ------------------------------------------------------------------- Aortic valve:  Trileaflet; mildly thickened, mildly calcified leaflets. Mobility was not restricted. Doppler: Transvalvular velocity couldn&'t be assessed. There was mild regurgitation.  ------------------------------------------------------------------- Aorta: Aortic root: The aortic root was normal in size.  ------------------------------------------------------------------- Mitral valve:  Structurally normal valve.  Mobility was not restricted. Doppler: Transvalvular velocity was within the normal range. There was no evidence for stenosis. There was mild regurgitation.  Peak gradient (D): 5 mm Hg.  ------------------------------------------------------------------- Left atrium: The atrium was mildly dilated.  ------------------------------------------------------------------- Right ventricle: The cavity size was normal. Wall thickness was normal. Systolic function was normal.  ------------------------------------------------------------------- Pulmonic valve:  Structurally normal valve.  Cusp separation was normal. Doppler:  Transvalvular velocity was within the normal range. There was no evidence for stenosis. There was trivial regurgitation.  ------------------------------------------------------------------- Tricuspid valve:  Structurally normal valve.  Doppler: Transvalvular velocity was within the normal range. There was mild regurgitation.  ------------------------------------------------------------------- Pulmonary artery:  The main pulmonary artery was normal-sized. Systolic pressure was within the normal range.  ------------------------------------------------------------------- Right atrium: The atrium was  normal in size.  ------------------------------------------------------------------- Pericardium: There was no pericardial effusion.  ------------------------------------------------------------------- Systemic veins: Inferior vena cava: The vessel was normal in size.  ------------------------------------------------------------------- Measurements  Left ventricle             Value    Reference LV ID, ED, PLAX chordal    (L)   37.6 mm   43 - 52 LV ID, ES, PLAX chordal    (L)   20.7 mm   23 - 38 LV fx shortening, PLAX chordal     45  %   >=29 LV PW thickness, ED          14.9 mm   --------- IVS/LV PW ratio, ED      (H)   1.38     <=1.3 LV e&', lateral             5.15 cm/s  --------- LV E/e&', lateral            21.94    --------- LV e&', medial             3.73 cm/s  --------- LV E/e&', medial            30.29    --------- LV e&', average             4.44 cm/s  --------- LV E/e&', average            25.45    ---------  Ventricular septum           Value    Reference IVS thickness, ED           20.5 mm   ---------  Aorta                 Value     Reference Aortic root ID, ED           27  mm   --------- Ascending aorta ID, A-P, S       33  mm   ---------  Left atrium              Value    Reference LA ID, A-P, ES             41  mm   --------- LA ID/bsa, A-P             2.08 cm/m^2 <=2.2 LA volume, S              72  ml   --------- LA volume/bsa, S            36.5 ml/m^2 --------- LA volume, ES, 1-p A4C         73  ml   --------- LA volume/bsa, ES, 1-p A4C       37  ml/m^2 --------- LA volume, ES, 1-p A2C         69  ml   --------- LA volume/bsa, ES, 1-p A2C       34.9 ml/m^2 ---------  Mitral valve              Value    Reference Mitral E-wave peak velocity      113  cm/s  --------- Mitral A-wave peak velocity      65.6 cm/s  --------- Mitral deceleration time    (H)   303  ms   150 - 230 Mitral peak gradient, D        5   mm Hg --------- Mitral E/A ratio, peak  1.7     ---------  Pulmonary arteries           Value    Reference PA pressure, S, DP       (H)   36  mm Hg <=30  Tricuspid valve            Value    Reference Tricuspid regurg peak velocity     288  cm/s  --------- Tricuspid peak RV-RA gradient     33  mm Hg ---------  Systemic veins             Value    Reference Estimated CVP             3   mm Hg ---------  Right ventricle            Value    Reference RV pressure, S, DP       (H)   36  mm Hg <=30 RV s&', lateral, S           14.8 cm/s  ---------  Legend: (L) and (H) mark values outside specified reference range.  ------------------------------------------------------------------- Prepared and Electronically Authenticated by  Ena Dawley, M.D. [id number]:52:12   CARDIAC MRI  TECHNIQUE: The patient was scanned on a 1.5 Tesla GE magnet. A dedicated cardiac coil was used. Functional imaging was done using Fiesta sequences. 2,3, and 4 chamber views were done to assess for RWMA's. Modified Simpson's rule using a short axis stack was used to calculate an ejection fraction on a dedicated work Conservation officer, nature. The patient received 30 cc of Multihance. After 10 minutes inversion recovery sequences were used to assess for infiltration and scar tissue.  CONTRAST: 30 cc of Multihance  FINDINGS: 1. Normal left ventricular size with moderate left ventricular hypertrophy with severe hypertrophy of the basal septum and normal systolic function (LVEF = 63%). There are no regional wall motion abnormalities.  There is systolic anterior motion of the anterior leaflet of the mitral valve associated with LVOT gradient.  The measurements are as follows:  LVEDD: 43 mm  LVESD: 29 mm  Basal anteroseptal wall: 20 mm  Basal inferolateral wall: 12 mm  Mid anteroseptal wall: 13 mm  Apical septal wall: 12 mm  Apical lateral wall 11 mm  LVEDV: 95 mm  LVESV: 35 ml  SV: 60 ml  CO: 3.8 L/minute  Myocardial mass: 162 g  2. Normal right ventricular size, thickness and systolic function (RVEF = 56%). There are no regional wall motion abnormalities.  RVEDV: 88 ml  RVESV: 39 ml  3. Moderate mitral regurgitation with centrally directed jet.  Trivial aortic insufficiency.  4. Mildly dilated left atrium.  5. Normal caliber of the aortic root, aorta and pulmonary artery.  6. There is subendocardial late gadolinium enhancement in the basal inferior and inferolateral walls.  IMPRESSION: 1. Normal left ventricular size with small LV cavity. There is moderate left ventricular hypertrophy with severe hypertrophy of the basal septum and normal systolic  function (LVEF = 63%). There are no regional wall motion abnormalities.  There is systolic anterior motion of the anterior leaflet of the mitral valve associated with LVOT gradient.  Conclusively, these findings are consistent with hypertrophic cardiomyopathy with no late gadolinium enhancement found the in the anteroseptal wall.  2. Normal right ventricular size, thickness and systolic function (RVEF = 56%). There are no regional wall motion abnormalities.  3. Moderate mitral regurgitation with centrally directed jet. Trivial aortic insufficiency.  4. Mildly dilated  left atrium.  5. There is subendocardial late gadolinium enhancement in the basal inferior and inferolateral walls suggestive of prior subendocardial infarct.  Ena Dawley   Electronically Signed  By: Ena Dawley  On: 03/26/2014 21:02  24 hr. Holter monitor: Occasional PAC,  PVC. One 5 beat run of PAT. One 9 beat run of NSVT.   Lab Results  Component Value Date   GLUCOSE 103* 04/23/2014   CHOL 168 03/09/2014   TRIG 157.0* 03/09/2014   HDL 50.80 03/09/2014   LDLCALC 86 03/09/2014   ALT 7 03/09/2014   AST 14 03/09/2014   NA 139 04/23/2014   K 5.0 04/23/2014   CL 105 04/23/2014   CREATININE 1.01 04/23/2014   BUN 26* 04/23/2014   CO2 20 04/23/2014   Ecg today shows NSR with rate 67. LVH by voltage criteria. I have personally reviewed and interpreted this study.  Assessment / Plan: 1. HOCM. Classic morphologic features by Echo and MRI. MRI also shows some subendocardial scar consistent with HCM.  Initially improved on Toprol and lasix but now symptoms of dyspnea are worse. Currently has  class 2-3 symptoms. Will check lab work today including chemistries, CBC, BNP, and lipids. Will update Echo. If she still has a high gradient in LVOT may need to consider right and left heart cath with possible referral for septal myectomy. These possibilities were reviewed with her today.  2. Murmur.  Due to LVOT gradient. Also some MR.   3. Palpitations. NSVT noted on monitor. Symptoms resolved on beta blocker.  If she develops syncope or presyncope she will need to see EP.  4. Obesity.  5. Hypercholesterolemia.

## 2015-05-19 DIAGNOSIS — I472 Ventricular tachycardia: Secondary | ICD-10-CM | POA: Diagnosis not present

## 2015-05-19 DIAGNOSIS — R0602 Shortness of breath: Secondary | ICD-10-CM | POA: Diagnosis not present

## 2015-05-19 DIAGNOSIS — E7800 Pure hypercholesterolemia, unspecified: Secondary | ICD-10-CM | POA: Diagnosis not present

## 2015-05-19 DIAGNOSIS — I421 Obstructive hypertrophic cardiomyopathy: Secondary | ICD-10-CM | POA: Diagnosis not present

## 2015-05-19 LAB — CBC
HEMATOCRIT: 37.3 % (ref 35.0–45.0)
HEMOGLOBIN: 12.6 g/dL (ref 11.7–15.5)
MCH: 30.9 pg (ref 27.0–33.0)
MCHC: 33.8 g/dL (ref 32.0–36.0)
MCV: 91.4 fL (ref 80.0–100.0)
MPV: 9.9 fL (ref 7.5–12.5)
Platelets: 302 10*3/uL (ref 140–400)
RBC: 4.08 MIL/uL (ref 3.80–5.10)
RDW: 13.4 % (ref 11.0–15.0)
WBC: 6.7 10*3/uL (ref 3.8–10.8)

## 2015-05-19 LAB — LIPID PANEL
Cholesterol: 155 mg/dL (ref 125–200)
HDL: 37 mg/dL — ABNORMAL LOW (ref >=46)
LDL CALC: 88 mg/dL (ref <130)
Total CHOL/HDL Ratio: 4.2 Ratio (ref <=5.0)
Triglycerides: 151 mg/dL — ABNORMAL HIGH (ref <150)
VLDL: 30 mg/dL (ref <30)

## 2015-05-19 LAB — COMPREHENSIVE METABOLIC PANEL
ALBUMIN: 3.9 g/dL (ref 3.6–5.1)
ALT: 5 U/L — ABNORMAL LOW (ref 6–29)
AST: 13 U/L (ref 10–35)
Alkaline Phosphatase: 57 U/L (ref 33–130)
BUN: 20 mg/dL (ref 7–25)
CHLORIDE: 108 mmol/L (ref 98–110)
CO2: 19 mmol/L — AB (ref 20–31)
CREATININE: 1.22 mg/dL — AB (ref 0.60–0.93)
Calcium: 8.8 mg/dL (ref 8.6–10.4)
Glucose, Bld: 114 mg/dL — ABNORMAL HIGH (ref 65–99)
Potassium: 4.1 mmol/L (ref 3.5–5.3)
SODIUM: 139 mmol/L (ref 135–146)
Total Bilirubin: 0.4 mg/dL (ref 0.2–1.2)
Total Protein: 6.6 g/dL (ref 6.1–8.1)

## 2015-05-19 LAB — BRAIN NATRIURETIC PEPTIDE: Brain Natriuretic Peptide: 357 pg/mL — ABNORMAL HIGH (ref <100)

## 2015-06-02 ENCOUNTER — Other Ambulatory Visit (HOSPITAL_COMMUNITY): Payer: Medicare Other

## 2015-06-15 ENCOUNTER — Ambulatory Visit (HOSPITAL_COMMUNITY): Payer: Medicare Other | Attending: Cardiology

## 2015-06-15 ENCOUNTER — Other Ambulatory Visit: Payer: Self-pay

## 2015-06-15 DIAGNOSIS — I5189 Other ill-defined heart diseases: Secondary | ICD-10-CM | POA: Insufficient documentation

## 2015-06-15 DIAGNOSIS — Z87891 Personal history of nicotine dependence: Secondary | ICD-10-CM | POA: Insufficient documentation

## 2015-06-15 DIAGNOSIS — I517 Cardiomegaly: Secondary | ICD-10-CM | POA: Diagnosis not present

## 2015-06-15 DIAGNOSIS — R0600 Dyspnea, unspecified: Secondary | ICD-10-CM | POA: Diagnosis present

## 2015-06-15 DIAGNOSIS — I351 Nonrheumatic aortic (valve) insufficiency: Secondary | ICD-10-CM | POA: Diagnosis not present

## 2015-06-15 DIAGNOSIS — I472 Ventricular tachycardia: Secondary | ICD-10-CM | POA: Diagnosis not present

## 2015-06-15 DIAGNOSIS — I340 Nonrheumatic mitral (valve) insufficiency: Secondary | ICD-10-CM | POA: Diagnosis not present

## 2015-06-15 DIAGNOSIS — I421 Obstructive hypertrophic cardiomyopathy: Secondary | ICD-10-CM | POA: Insufficient documentation

## 2015-06-15 DIAGNOSIS — I4729 Other ventricular tachycardia: Secondary | ICD-10-CM

## 2015-06-15 DIAGNOSIS — R0602 Shortness of breath: Secondary | ICD-10-CM | POA: Diagnosis not present

## 2015-06-15 DIAGNOSIS — E785 Hyperlipidemia, unspecified: Secondary | ICD-10-CM | POA: Diagnosis not present

## 2015-06-15 DIAGNOSIS — E7800 Pure hypercholesterolemia, unspecified: Secondary | ICD-10-CM | POA: Insufficient documentation

## 2015-06-17 ENCOUNTER — Telehealth: Payer: Self-pay | Admitting: Cardiology

## 2015-06-17 ENCOUNTER — Other Ambulatory Visit: Payer: Self-pay | Admitting: Cardiology

## 2015-06-17 ENCOUNTER — Ambulatory Visit (INDEPENDENT_AMBULATORY_CARE_PROVIDER_SITE_OTHER): Payer: Medicare Other | Admitting: Cardiology

## 2015-06-17 ENCOUNTER — Encounter: Payer: Self-pay | Admitting: Cardiology

## 2015-06-17 VITALS — BP 132/70 | HR 71 | Ht 62.0 in | Wt 173.0 lb

## 2015-06-17 DIAGNOSIS — I421 Obstructive hypertrophic cardiomyopathy: Secondary | ICD-10-CM

## 2015-06-17 DIAGNOSIS — I472 Ventricular tachycardia: Secondary | ICD-10-CM | POA: Diagnosis not present

## 2015-06-17 DIAGNOSIS — E7800 Pure hypercholesterolemia, unspecified: Secondary | ICD-10-CM | POA: Diagnosis not present

## 2015-06-17 DIAGNOSIS — R0602 Shortness of breath: Secondary | ICD-10-CM

## 2015-06-17 DIAGNOSIS — I4729 Other ventricular tachycardia: Secondary | ICD-10-CM

## 2015-06-17 MED ORDER — METOPROLOL SUCCINATE ER 100 MG PO TB24: ORAL_TABLET | ORAL | Status: DC

## 2015-06-17 MED ORDER — METOPROLOL SUCCINATE ER 100 MG PO TB24: 100.0000 mg | ORAL_TABLET | Freq: Every day | ORAL | Status: DC

## 2015-06-17 NOTE — Telephone Encounter (Signed)
Advised patient and scheduled ov today. She will try to get in touch with husband to see if he can come today      Notes Recorded by Peter M Martinique, MD on 06/16/2015 at 5:07 PM Echo shows hypertrophic cardiomyopathy. She still has a very high LV outflow gradient and now severe MR due to systolic motion of the valve. We will need to see to arrange right and left heart cath to evaluate hemodynamics and consider surgical options. Please work her in to see me. Her husband should come with her to visit.

## 2015-06-17 NOTE — Patient Instructions (Addendum)
Increase Toprol to 100 mg in the morning and 50 mg in the evening  Continue your other therapy  We will schedule you for a right and left heart cath. DX CARDIOMYOPATHY AND SOB  ON  June 4 OR June 5   HOLD ANY FLUID PILLS THE MORNING OF PROCEDURE   RETURN FOR LAB WORK  ONE WEEK BEFORE AT THE [facility]

## 2015-06-17 NOTE — Telephone Encounter (Signed)
Rx request sent to pharmacy.  

## 2015-06-17 NOTE — Telephone Encounter (Signed)
New message     Pt c/o medication issue:  1. Name of Medication:  Metoprolol ER  2. How are you currently taking this medication (dosage and times per day)? 100 mg take once daily  3. Are you having a reaction (difficulty breathing--STAT)? No  4. What is your medication issue? The pharmacy needs the way for the pt to take it clarified cause the way the Md wrote instructions it is 2 different directions

## 2015-06-17 NOTE — Progress Notes (Signed)
Melinda Gonzales Date of Birth: [birth year]   History of Present Illness: Melinda Gonzales is seen for follow up HOCM after recent Echo.  She was evaluated in 2011 with a Myoview stress test which was normal. She had an Echo at that time that showed moderate LVH with EF 65-70%. Mild MR. She does have a history of murmur. She is not a smoker. In early 2016 she presented with increased dyspnea and an Echo and cardiac MRI were done. These with both consistent with HOCM. LVOT gradient of 112 mm Hg at rest. She was started on low dose lasix and Toprol XL with good response initially with improved dyspnea. Seen more recently with symptoms of increased dyspnea. No increased edema or orthopnea but more dyspnea with walking or lifting. No chest pain. No dizziness or palpitations. Energy level has decreased. Repeat Echo done and note below.   Current Outpatient Prescriptions on File Prior to Visit  Medication Sig Dispense Refill  . furosemide (LASIX) 20 MG tablet TAKE 1 TABLET BY MOUTH DAILY 30 tablet 6  . potassium chloride SA (K-DUR,KLOR-CON) 20 MEQ tablet TAKE 1 TABLET BY MOUTH DAILY 30 tablet 1  . rosuvastatin (CRESTOR) 5 MG tablet Take 5 mg by mouth daily.       No current facility-administered medications on file prior to visit.    No Known Allergies  Past Medical History  Diagnosis Date  . Hypercholesterolemia   . Dyspnea   . Heart murmur   . LVH (left ventricular hypertrophy)     WITH NORMAL SYSTOLIC FUNCTION  . SOB (shortness of breath)   . Hypertrophic obstructive cardiomyopathy (HOCM) ([place])     History reviewed. No pertinent past surgical history.  History  Smoking status  . Former Smoker  . Quit date: 05/26/1965  Smokeless tobacco  . Not on file    History  Alcohol Use No    Family History  Problem Relation Age of Onset  . Heart failure Mother 90  . Heart attack Father 39    Review of Systems: As noted in HPI.   All other systems were reviewed and are  negative.  Physical Exam: BP 132/70 mmHg  Pulse 71  Ht 5\' 2"  (1.575 m)  Wt 78.472 kg (173 lb)  BMI 31.63 kg/m2 She is an obese white female in no distress. Her HEENT exam is unremarkable. She has no JVD or bruits. Lungs are clear. Cardiac exam reveals a harsh grade 3/6 systolic murmur at the  right upper sternal border radiating to the apex. Harsh apical murmur as well. There is no S3. Abdomen is soft and nontender. She has no edema. Pedal pulses are excellent. Neurologic exam is nonfocal. Body mass index is 31.63 kg/(m^2).   LABORATORY DATA: Echo: 03/05/14:Transthoracic Echocardiography  Patient:  Melinda, Gonzales MR #:    [id number] Study Date: 03/05/2014 Gender:   F Age:    81 Height:   157.5 cm Weight:   85.7 kg BSA:    1.98 m^2 Pt. Status: Room:  ATTENDING  Melinda Gonzales, M.D. ORDERING   Melinda Gonzales, M.D. REFERRING  Melinda Gonzales, M.D. SONOGRAPHER Melinda Gonzales, RDCS PERFORMING  [facility], Outpatient  cc:  ------------------------------------------------------------------- LV EF: 65% -  70%  ------------------------------------------------------------------- Indications:   SOB (R06.02). Murmur (R06.00).  ------------------------------------------------------------------- History:  PMH: Acquired from the patient and from the patient&'s chart. Dyspnea and Harsh 3/6 systolic murmur RUSB radiating to the apex. Risk factors: Former tobacco use. Hypertension. Obese.  ------------------------------------------------------------------- Study Conclusions  - Left  ventricle: The cavity size was normal. There was severe concentric hypertrophy. Systolic function was vigorous. The estimated ejection fraction was in the range of 65% to 70%. Wall motion was normal; there were no regional wall motion abnormalities. Features are consistent with a pseudonormal left ventricular filling pattern, with concomitant abnormal  relaxation and increased filling pressure (grade 2 diastolic dysfunction). Doppler parameters are consistent with elevated ventricular end-diastolic filling pressure. - Aortic valve: Trileaflet; mildly thickened, mildly calcified leaflets. There was mild regurgitation. - Aortic root: The aortic root was normal in size. - Mitral valve: Structurally normal valve. There was mild regurgitation. - Left atrium: The atrium was mildly dilated. - Right ventricle: The cavity size was normal. Wall thickness was normal. Systolic function was normal. - Right atrium: The atrium was normal in size. - Pulmonic valve: There was trivial regurgitation. - Pulmonary arteries: Systolic pressure was within the normal range. PA peak pressure: 36 mm Hg (S). - Inferior vena cava: The vessel was normal in size. - Pericardium, extracardiac: There was no pericardial effusion.  Impressions:  - There is severe left ventricular hypertrophy with systolic near cavity obliteration and LVOT gradient of 112 mmHg at rest. LVEF is hyperdynamic. There is systolic anterior motion of the mitral valve leaflet. Aortic valve is poorly visualized but appears mildly thickened and calcified, transaortic gradient couldn&'t be assessed sec to significant LVOT gradient. There is mild aortic insufficiency.  A cardiac MRI should be considered for evaluation of hypertrophic cardiomyopathy.  ------------------------------------------------------------------- Labs, prior tests, procedures, and surgery: Echocardiography (October 2011).   EF was 70%.  Transthoracic echocardiography. M-mode, complete 2D, spectral Doppler, and color Doppler. Birthdate: Patient birthdate: Jan 21, 1935. Age: Patient is 81 yr old. Sex: Gender: female. BMI: 34.6 kg/m^2. Blood pressure:   120/68 Patient status: Outpatient. Study date: Study date: 03/05/2014. Study time: 09:43 AM. Location: [facility]  [facility]  -------------------------------------------------------------------  ------------------------------------------------------------------- Left ventricle: The cavity size was normal. There was severe concentric hypertrophy. Systolic function was vigorous. The estimated ejection fraction was in the range of 65% to 70%. Wall motion was normal; there were no regional wall motion abnormalities. Features are consistent with a pseudonormal left ventricular filling pattern, with concomitant abnormal relaxation and increased filling pressure (grade 2 diastolic dysfunction). Doppler parameters are consistent with elevated ventricular end-diastolic filling pressure.  ------------------------------------------------------------------- Aortic valve:  Trileaflet; mildly thickened, mildly calcified leaflets. Mobility was not restricted. Doppler: Transvalvular velocity couldn&'t be assessed. There was mild regurgitation.  ------------------------------------------------------------------- Aorta: Aortic root: The aortic root was normal in size.  ------------------------------------------------------------------- Mitral valve:  Structurally normal valve.  Mobility was not restricted. Doppler: Transvalvular velocity was within the normal range. There was no evidence for stenosis. There was mild regurgitation.  Peak gradient (D): 5 mm Hg.  ------------------------------------------------------------------- Left atrium: The atrium was mildly dilated.  ------------------------------------------------------------------- Right ventricle: The cavity size was normal. Wall thickness was normal. Systolic function was normal.  ------------------------------------------------------------------- Pulmonic valve:  Structurally normal valve.  Cusp separation was normal. Doppler: Transvalvular velocity was within the normal range. There was no evidence for stenosis. There was  trivial regurgitation.  ------------------------------------------------------------------- Tricuspid valve:  Structurally normal valve.  Doppler: Transvalvular velocity was within the normal range. There was mild regurgitation.  ------------------------------------------------------------------- Pulmonary artery:  The main pulmonary artery was normal-sized. Systolic pressure was within the normal range.  ------------------------------------------------------------------- Right atrium: The atrium was normal in size.  ------------------------------------------------------------------- Pericardium: There was no pericardial effusion.  ------------------------------------------------------------------- Systemic veins: Inferior vena cava: The vessel was normal in size.  ------------------------------------------------------------------- Measurements  Left ventricle             Value    Reference LV ID, ED, PLAX chordal    (L)   37.6 mm   43 - 52 LV ID, ES, PLAX chordal    (L)   20.7 mm   23 - 38 LV fx shortening, PLAX chordal     45  %   >=29 LV PW thickness, ED          14.9 mm   --------- IVS/LV PW ratio, ED      (H)   1.38     <=1.3 LV e&', lateral             5.15 cm/s  --------- LV E/e&', lateral            21.94    --------- LV e&', medial             3.73 cm/s  --------- LV E/e&', medial            30.29    --------- LV e&', average             4.44 cm/s  --------- LV E/e&', average            25.45    ---------  Ventricular septum           Value    Reference IVS thickness, ED           20.5 mm   ---------  Aorta                 Value    Reference Aortic root ID, ED           27  mm   --------- Ascending aorta ID, A-P, S        33  mm   ---------  Left atrium              Value    Reference LA ID, A-P, ES             41  mm   --------- LA ID/bsa, A-P             2.08 cm/m^2 <=2.2 LA volume, S              72  ml   --------- LA volume/bsa, S            36.5 ml/m^2 --------- LA volume, ES, 1-p A4C         73  ml   --------- LA volume/bsa, ES, 1-p A4C       37  ml/m^2 --------- LA volume, ES, 1-p A2C         69  ml   --------- LA volume/bsa, ES, 1-p A2C       34.9 ml/m^2 ---------  Mitral valve              Value    Reference Mitral E-wave peak velocity      113  cm/s  --------- Mitral A-wave peak velocity      65.6 cm/s  --------- Mitral deceleration time    (H)   303  ms   150 - 230 Mitral peak gradient, D        5   mm Hg --------- Mitral E/A ratio, peak         1.7     ---------  Pulmonary arteries           Value  Reference PA pressure, S, DP       (H)   36  mm Hg <=30  Tricuspid valve            Value    Reference Tricuspid regurg peak velocity     288  cm/s  --------- Tricuspid peak RV-RA gradient     33  mm Hg ---------  Systemic veins             Value    Reference Estimated CVP             3   mm Hg ---------  Right ventricle            Value    Reference RV pressure, S, DP       (H)   36  mm Hg <=30 RV s&', lateral, S           14.8 cm/s  ---------  Legend: (L) and (H) mark values outside specified reference range.  ------------------------------------------------------------------- Prepared and Electronically Authenticated by  Ena Dawley, M.D. 2016-02-04T17:52:12   CARDIAC MRI  TECHNIQUE: The patient was scanned on a 1.5 Tesla GE magnet.  A dedicated cardiac coil was used. Functional imaging was done using Fiesta sequences. 2,3, and 4 chamber views were done to assess for RWMA's. Modified Simpson's rule using a short axis stack was used to calculate an ejection fraction on a dedicated work Conservation officer, nature. The patient received 30 cc of Multihance. After 10 minutes inversion recovery sequences were used to assess for infiltration and scar tissue.  CONTRAST: 30 cc of Multihance  FINDINGS: 1. Normal left ventricular size with moderate left ventricular hypertrophy with severe hypertrophy of the basal septum and normal systolic function (LVEF = 63%). There are no regional wall motion abnormalities.  There is systolic anterior motion of the anterior leaflet of the mitral valve associated with LVOT gradient.  The measurements are as follows:  LVEDD: 43 mm  LVESD: 29 mm  Basal anteroseptal wall: 20 mm  Basal inferolateral wall: 12 mm  Mid anteroseptal wall: 13 mm  Apical septal wall: 12 mm  Apical lateral wall 11 mm  LVEDV: 95 mm  LVESV: 35 ml  SV: 60 ml  CO: 3.8 L/minute  Myocardial mass: 162 g  2. Normal right ventricular size, thickness and systolic function (RVEF = 56%). There are no regional wall motion abnormalities.  RVEDV: 88 ml  RVESV: 39 ml  3. Moderate mitral regurgitation with centrally directed jet.  Trivial aortic insufficiency.  4. Mildly dilated left atrium.  5. Normal caliber of the aortic root, aorta and pulmonary artery.  6. There is subendocardial late gadolinium enhancement in the basal inferior and inferolateral walls.  IMPRESSION: 1. Normal left ventricular size with small LV cavity. There is moderate left ventricular hypertrophy with severe hypertrophy of the basal septum and normal systolic function (LVEF = 63%). There are no regional wall motion abnormalities.  There is systolic anterior motion of the  anterior leaflet of the mitral valve associated with LVOT gradient.  Conclusively, these findings are consistent with hypertrophic cardiomyopathy with no late gadolinium enhancement found the in the anteroseptal wall.  2. Normal right ventricular size, thickness and systolic function (RVEF = 56%). There are no regional wall motion abnormalities.  3. Moderate mitral regurgitation with centrally directed jet. Trivial aortic insufficiency.  4. Mildly dilated left atrium.  5. There is subendocardial late gadolinium enhancement in the basal inferior and inferolateral walls suggestive of prior subendocardial infarct.  Ena Dawley   Electronically Signed  By: Ena Dawley  On: 03/26/2014 21:02  24 hr. Holter monitor 03/05/14: Occasional PAC,  PVC. One 5 beat run of PAT. One 9 beat run of NSVT.   Repeat Echo 06/15/15: Study Conclusions  - Left ventricle: The cavity size was normal. There was severe  asymmetric hypertrophy of the septum. Systolic function was  vigorous. The estimated ejection fraction was in the range of 65%  to 70%. Wall motion was normal; there were no regional wall  motion abnormalities. Features are consistent with a pseudonormal  left ventricular filling pattern, with concomitant abnormal  relaxation and increased filling pressure (grade 2 diastolic  dysfunction). Doppler parameters are consistent with high  ventricular filling pressure. - Aortic valve: Valve mobility was restricted. There was mild  regurgitation. - Mitral valve: There was severe systolic anterior motion. There  was moderate to severe regurgitation. - Left atrium: The atrium was mildly dilated. - Pulmonary arteries: Systolic pressure was moderately increased.  PA peak pressure: 51 mm Hg (S).  Impressions:  - Vigorous LV systolic function; grade 2 diastolic dysfunction with  elevated LV filling pressure; severe asymmetric LVH; mild AI; MV  SAM with peak  LVOT gradient of 6 m/s; moderate to severe MR; mild  LAE; mild TR with moderately elevated pulmonary pressure;  findings c/w HOCM.  ------------------------------------------------------------------- Labs, prior tests, procedures, and surgery: Transthoracic echocardiography (03/05/2014). EF was 70%. LVOT gradiant:112 mmHg.  Transthoracic echocardiography. M-mode, complete 2D, spectral Doppler, and color Doppler. Birthdate: Patient birthdate: 02/24/1934. Age: Patient is 81 yr old. Sex: Gender: female. BMI: 31.9 kg/m^2. Blood pressure: 138/76 Patient status: Outpatient. Study date: Study date: 06/15/2015. Study time: 11:32 AM. Location: [facility]  -------------------------------------------------------------------  ------------------------------------------------------------------- Left ventricle: The cavity size was normal. There was severe asymmetric hypertrophy of the septum. Systolic function was vigorous. The estimated ejection fraction was in the range of 65% to 70%. Wall motion was normal; there were no regional wall motion abnormalities. Features are consistent with a pseudonormal left ventricular filling pattern, with concomitant abnormal relaxation and increased filling pressure (grade 2 diastolic dysfunction). Doppler parameters are consistent with high ventricular filling pressure.  ------------------------------------------------------------------- Aortic valve: Trileaflet; mildly calcified leaflets. Valve mobility was restricted. Doppler: Transvalvular velocity was within the normal range. There was no stenosis. There was mild regurgitation.  ------------------------------------------------------------------- Aorta: Aortic root: The aortic root was normal in size.  ------------------------------------------------------------------- Mitral valve: Mildly thickened leaflets . Mobility was not restricted. There was severe  systolic anterior motion. Doppler: Transvalvular velocity was within the normal range. There was no evidence for stenosis. There was moderate to severe regurgitation.  Peak gradient (D): 5 mm Hg.  ------------------------------------------------------------------- Left atrium: The atrium was mildly dilated.  ------------------------------------------------------------------- Right ventricle: The cavity size was normal. Systolic function was normal.  ------------------------------------------------------------------- Pulmonic valve: Doppler: Transvalvular velocity was within the normal range. There was no evidence for stenosis. There was mild regurgitation.  ------------------------------------------------------------------- Tricuspid valve: Structurally normal valve. Doppler: Transvalvular velocity was within the normal range. There was mild regurgitation.  ------------------------------------------------------------------- Pulmonary artery: Systolic pressure was moderately increased.  ------------------------------------------------------------------- Right atrium: The atrium was normal in size.  ------------------------------------------------------------------- Pericardium: There was no pericardial effusion.  ------------------------------------------------------------------- Systemic veins: Inferior vena cava: The vessel was normal in size.  ------------------------------------------------------------------- Measurements  Left ventricle Value Reference LV ID, ED, PLAX chordal (L) 39.6 mm 43 - 52 LV ID, ES, PLAX chordal 28.3 mm 23 - 38 LV fx shortening, PLAX chordal 29 % >=29 LV PW thickness,  ED 12 mm --------- IVS/LV PW ratio, ED (H) 1.51 <=1.3 LV e&', lateral 8.78  cm/s --------- LV E/e&', lateral 13.1 --------- LV e&', medial 3.81 cm/s --------- LV E/e&', medial 30.18 --------- LV e&', average 6.3 cm/s --------- LV E/e&', average 18.27 ---------  Ventricular septum Value Reference IVS thickness, ED 18.1 mm ---------  LVOT Value Reference LVOT ID, S 15 mm --------- LVOT area 1.77 cm^2 ---------  Aortic valve Value Reference Aortic regurg pressure half-time 495 ms ---------  Aorta Value Reference Aortic root ID, ED 29 mm --------- Ascending aorta ID, A-P, S 32 mm ---------  Left atrium Value Reference LA ID, A-P, ES 41 mm --------- LA ID/bsa, A-P 2.17 cm/m^2 <=2.2 LA volume, S 65.1 ml --------- LA volume/bsa, S 34.4 ml/m^2 --------- LA volume, ES, 1-p A4C 63.6 ml --------- LA volume/bsa, ES, 1-p A4C 33.6 ml/m^2 --------- LA volume, ES, 1-p A2C 64.4 ml --------- LA volume/bsa, ES, 1-p A2C 34.1 ml/m^2 ---------  Mitral valve Value Reference Mitral E-wave peak velocity 115 cm/s --------- Mitral A-wave peak velocity 67.4 cm/s --------- Mitral deceleration time (H) 320 ms 150 - 230 Mitral peak gradient, D 5 mm Hg  --------- Mitral E/A ratio, peak 1.7 --------- Mitral regurg VTI, PISA 242 cm --------- Mitral ERO, PISA 0.16 cm^2 --------- Mitral regurg volume, PISA 39 ml ---------  Pulmonary arteries Value Reference PA pressure, S, DP (H) 51 mm Hg <=30  Tricuspid valve Value Reference Tricuspid regurg peak velocity 326 cm/s --------- Tricuspid peak RV-RA gradient 43 mm Hg ---------  Systemic veins Value Reference Estimated CVP 8 mm Hg ---------  Right ventricle Value Reference RV pressure, S, DP (H) 51 mm Hg <=30 RV s&', lateral, S 12 cm/s ---------  Pulmonic valve Value Reference Pulmonic regurg velocity, ED 126 cm/s --------- Pulmonic regurg gradient, ED 6 mm Hg ---------  Legend: (L) and (H) mark values outside specified reference range.  ------------------------------------------------------------------- Prepared and Electronically Authenticated by  Kirk Ruths 2017-05-16T15:00:39  Lab Results  Component Value Date   WBC 6.7 05/19/2015   HGB 12.6 05/19/2015   HCT 37.3 05/19/2015   PLT 302 05/19/2015   GLUCOSE 114* 05/19/2015   CHOL 155 05/19/2015   TRIG 151* 05/19/2015   HDL 37* 05/19/2015   LDLCALC 88 05/19/2015   ALT 5* 05/19/2015   AST 13 05/19/2015   NA 139 05/19/2015   K 4.1 05/19/2015   CL 108 05/19/2015   CREATININE 1.22* 05/19/2015   BUN 20 05/19/2015   CO2 19* 05/19/2015     Assessment / Plan: 1. HOCM. Classic morphologic features by Echo and MRI. MRI also shows some subendocardial scar consistent with HCM.   Initially improved on Toprol and lasix but now symptoms of dyspnea are worse. Currently has  class 2-3 symptoms. Echo shows increased LVOT gradient on medication and more SAM and MR. She has evidence of pulmonary HTN. I recommend a right and left heart cath with possible referral for septal myectomy. Will schedule cardiac cath for the first week of June. Will increase Toprol XL to 100 mg in am and 50 mg in pm. Although she is almost 82 she is still in good overall health and I think would benefit from surgery. We also discussed possible alcohol septal ablation but I think she would get a better result with surgery. The procedure and risks were reviewed including but not limited to death, myocardial infarction, stroke, arrythmias, bleeding, transfusion, emergency surgery, dye allergy, or renal dysfunction. The patient voices understanding and is agreeable to proceed.Marland Kitchen  2. Murmur. Due to LVOT gradient and mod- severe  MR.   3. Palpitations. NSVT noted on monitor. Symptoms resolved on beta blocker.  If she develops syncope or presyncope she will need to see EP.  4. Obesity.  5. Hypercholesterolemia.

## 2015-06-17 NOTE — Telephone Encounter (Signed)
New Message  Pt called request a call back with ECHO results.

## 2015-06-21 ENCOUNTER — Other Ambulatory Visit: Payer: Self-pay | Admitting: Cardiology

## 2015-06-22 NOTE — Telephone Encounter (Signed)
Rx(s) sent to pharmacy electronically.  

## 2015-06-23 ENCOUNTER — Encounter (HOSPITAL_COMMUNITY): Payer: Self-pay | Admitting: Pharmacy Technician

## 2015-07-01 ENCOUNTER — Other Ambulatory Visit (INDEPENDENT_AMBULATORY_CARE_PROVIDER_SITE_OTHER): Payer: Medicare Other | Admitting: *Deleted

## 2015-07-01 DIAGNOSIS — R0602 Shortness of breath: Secondary | ICD-10-CM

## 2015-07-01 DIAGNOSIS — I421 Obstructive hypertrophic cardiomyopathy: Secondary | ICD-10-CM | POA: Diagnosis not present

## 2015-07-01 LAB — CBC
HCT: 37.6 % (ref 35.0–45.0)
Hemoglobin: 12.5 g/dL (ref 11.7–15.5)
MCH: 31 pg (ref 27.0–33.0)
MCHC: 33.2 g/dL (ref 32.0–36.0)
MCV: 93.3 fL (ref 80.0–100.0)
MPV: 10 fL (ref 7.5–12.5)
PLATELETS: 282 10*3/uL (ref 140–400)
RBC: 4.03 MIL/uL (ref 3.80–5.10)
RDW: 13.8 % (ref 11.0–15.0)
WBC: 6.5 10*3/uL (ref 3.8–10.8)

## 2015-07-01 LAB — BASIC METABOLIC PANEL
BUN: 19 mg/dL (ref 7–25)
CALCIUM: 9.2 mg/dL (ref 8.6–10.4)
CO2: 21 mmol/L (ref 20–31)
CREATININE: 1.13 mg/dL — AB (ref 0.60–0.93)
Chloride: 109 mmol/L (ref 98–110)
Glucose, Bld: 112 mg/dL — ABNORMAL HIGH (ref 65–99)
Potassium: 4.1 mmol/L (ref 3.5–5.3)
SODIUM: 141 mmol/L (ref 135–146)

## 2015-07-01 LAB — APTT: APTT: 34 s (ref 24–37)

## 2015-07-01 NOTE — Addendum Note (Signed)
Addended by: Eulis Foster on: 07/01/2015 07:53 AM   Modules accepted: Orders

## 2015-07-06 ENCOUNTER — Encounter (HOSPITAL_COMMUNITY)
Admission: RE | Disposition: A | Discharge status: Home / Self Care | Payer: Self-pay | Source: Ambulatory Visit | Attending: Cardiology

## 2015-07-06 ENCOUNTER — Encounter (HOSPITAL_COMMUNITY): Payer: Self-pay | Admitting: Cardiology

## 2015-07-06 ENCOUNTER — Ambulatory Visit (HOSPITAL_COMMUNITY)
Admission: RE | Admit: 2015-07-06 | Discharge: 2015-07-06 | Disposition: A | Discharge status: Home / Self Care | Payer: Medicare Other | Source: Ambulatory Visit | Attending: Cardiology | Admitting: Cardiology

## 2015-07-06 DIAGNOSIS — I4729 Other ventricular tachycardia: Secondary | ICD-10-CM

## 2015-07-06 DIAGNOSIS — I2510 Atherosclerotic heart disease of native coronary artery without angina pectoris: Secondary | ICD-10-CM | POA: Diagnosis not present

## 2015-07-06 DIAGNOSIS — I472 Ventricular tachycardia: Secondary | ICD-10-CM | POA: Insufficient documentation

## 2015-07-06 DIAGNOSIS — I509 Heart failure, unspecified: Secondary | ICD-10-CM | POA: Insufficient documentation

## 2015-07-06 DIAGNOSIS — Z8249 Family history of ischemic heart disease and other diseases of the circulatory system: Secondary | ICD-10-CM | POA: Insufficient documentation

## 2015-07-06 DIAGNOSIS — Z87891 Personal history of nicotine dependence: Secondary | ICD-10-CM | POA: Insufficient documentation

## 2015-07-06 DIAGNOSIS — I110 Hypertensive heart disease with heart failure: Secondary | ICD-10-CM | POA: Diagnosis not present

## 2015-07-06 DIAGNOSIS — Z6831 Body mass index (BMI) 31.0-31.9, adult: Secondary | ICD-10-CM | POA: Insufficient documentation

## 2015-07-06 DIAGNOSIS — I421 Obstructive hypertrophic cardiomyopathy: Secondary | ICD-10-CM | POA: Diagnosis not present

## 2015-07-06 DIAGNOSIS — I340 Nonrheumatic mitral (valve) insufficiency: Secondary | ICD-10-CM | POA: Insufficient documentation

## 2015-07-06 DIAGNOSIS — E7800 Pure hypercholesterolemia, unspecified: Secondary | ICD-10-CM | POA: Insufficient documentation

## 2015-07-06 DIAGNOSIS — R0602 Shortness of breath: Secondary | ICD-10-CM | POA: Diagnosis present

## 2015-07-06 DIAGNOSIS — E669 Obesity, unspecified: Secondary | ICD-10-CM | POA: Insufficient documentation

## 2015-07-06 DIAGNOSIS — I272 Other secondary pulmonary hypertension: Secondary | ICD-10-CM | POA: Diagnosis not present

## 2015-07-06 DIAGNOSIS — I5032 Chronic diastolic (congestive) heart failure: Secondary | ICD-10-CM

## 2015-07-06 HISTORY — DX: Chronic diastolic (congestive) heart failure: I50.32

## 2015-07-06 HISTORY — PX: CARDIAC CATHETERIZATION: SHX172

## 2015-07-06 LAB — POCT I-STAT 3, VENOUS BLOOD GAS (G3P V)
Acid-base deficit: 7 mmol/L — ABNORMAL HIGH (ref 0.0–2.0)
Bicarbonate: 18.6 mEq/L — ABNORMAL LOW (ref 20.0–24.0)
O2 SAT: 56 %
PCO2 VEN: 35.3 mmHg — AB (ref 45.0–50.0)
PO2 VEN: 31 mmHg (ref 31.0–45.0)
TCO2: 20 mmol/L (ref 0–100)
pH, Ven: 7.33 — ABNORMAL HIGH (ref 7.250–7.300)

## 2015-07-06 LAB — PROTIME-INR
INR: 1.03 (ref 0.00–1.49)
PROTHROMBIN TIME: 13.7 s (ref 11.6–15.2)

## 2015-07-06 LAB — POCT I-STAT 3, ART BLOOD GAS (G3+)
ACID-BASE DEFICIT: 8 mmol/L — AB (ref 0.0–2.0)
BICARBONATE: 16.8 meq/L — AB (ref 20.0–24.0)
O2 SAT: 93 %
TCO2: 18 mmol/L (ref 0–100)
pCO2 arterial: 29.7 mmHg — ABNORMAL LOW (ref 35.0–45.0)
pH, Arterial: 7.361 (ref 7.350–7.450)
pO2, Arterial: 67 mmHg — ABNORMAL LOW (ref 80.0–100.0)

## 2015-07-06 SURGERY — RIGHT/LEFT HEART CATH AND CORONARY ANGIOGRAPHY

## 2015-07-06 MED ORDER — FUROSEMIDE 10 MG/ML IJ SOLN
INTRAMUSCULAR | Status: AC
  Filled 2015-07-06: qty 4

## 2015-07-06 MED ORDER — FENTANYL CITRATE (PF) 100 MCG/2ML IJ SOLN
INTRAMUSCULAR | Status: AC
  Filled 2015-07-06: qty 2

## 2015-07-06 MED ORDER — FENTANYL CITRATE (PF) 100 MCG/2ML IJ SOLN
INTRAMUSCULAR | Status: DC | PRN
  Administered 2015-07-06: 25 ug via INTRAVENOUS

## 2015-07-06 MED ORDER — VERAPAMIL HCL 2.5 MG/ML IV SOLN
INTRAVENOUS | Status: AC
  Filled 2015-07-06: qty 2

## 2015-07-06 MED ORDER — SODIUM CHLORIDE 0.9 % IV SOLN: 250.0000 mL | INTRAVENOUS | Status: DC | PRN

## 2015-07-06 MED ORDER — IOPAMIDOL (ISOVUE-370) INJECTION 76%
INTRAVENOUS | Status: AC
  Filled 2015-07-06: qty 100

## 2015-07-06 MED ORDER — LIDOCAINE HCL (PF) 1 % IJ SOLN
INTRAMUSCULAR | Status: DC | PRN
  Administered 2015-07-06: 14 mL via INTRADERMAL
  Administered 2015-07-06 (×2): 2 mL via INTRADERMAL

## 2015-07-06 MED ORDER — SODIUM CHLORIDE 0.9% FLUSH: 3.0000 mL | INTRAVENOUS | Status: DC | PRN

## 2015-07-06 MED ORDER — SODIUM CHLORIDE 0.9% FLUSH: 3.0000 mL | Freq: Two times a day (BID) | INTRAVENOUS | Status: DC

## 2015-07-06 MED ORDER — SODIUM CHLORIDE 0.9 % IV SOLN
INTRAVENOUS | Status: DC
  Administered 2015-07-06: 07:00:00 via INTRAVENOUS

## 2015-07-06 MED ORDER — MIDAZOLAM HCL 2 MG/2ML IJ SOLN
INTRAMUSCULAR | Status: AC
  Filled 2015-07-06: qty 2

## 2015-07-06 MED ORDER — HEPARIN (PORCINE) IN NACL 2-0.9 UNIT/ML-% IJ SOLN
INTRAMUSCULAR | Status: DC | PRN
  Administered 2015-07-06: 1500 mL

## 2015-07-06 MED ORDER — ASPIRIN 81 MG PO CHEW
CHEWABLE_TABLET | ORAL | Status: AC
  Filled 2015-07-06: qty 1

## 2015-07-06 MED ORDER — HEPARIN SODIUM (PORCINE) 1000 UNIT/ML IJ SOLN
INTRAMUSCULAR | Status: AC
  Filled 2015-07-06: qty 1

## 2015-07-06 MED ORDER — LIDOCAINE HCL (PF) 1 % IJ SOLN
INTRAMUSCULAR | Status: AC
  Filled 2015-07-06: qty 30

## 2015-07-06 MED ORDER — HEPARIN (PORCINE) IN NACL 2-0.9 UNIT/ML-% IJ SOLN
INTRAMUSCULAR | Status: AC
  Filled 2015-07-06: qty 1000

## 2015-07-06 MED ORDER — MIDAZOLAM HCL 2 MG/2ML IJ SOLN
INTRAMUSCULAR | Status: DC | PRN
  Administered 2015-07-06: 1 mg via INTRAVENOUS

## 2015-07-06 MED ORDER — FUROSEMIDE 10 MG/ML IJ SOLN
20.0000 mg | Freq: Once | INTRAMUSCULAR | Status: AC
  Administered 2015-07-06: 20 mg via INTRAVENOUS

## 2015-07-06 MED ORDER — FUROSEMIDE 20 MG PO TABS: 40.0000 mg | ORAL_TABLET | Freq: Every day | ORAL | Status: DC

## 2015-07-06 MED ORDER — ASPIRIN 81 MG PO CHEW
81.0000 mg | CHEWABLE_TABLET | ORAL | Status: AC
  Administered 2015-07-06: 81 mg via ORAL

## 2015-07-06 MED ORDER — IOPAMIDOL (ISOVUE-370) INJECTION 76%
INTRAVENOUS | Status: DC | PRN
  Administered 2015-07-06: 50 mL via INTRA_ARTERIAL

## 2015-07-06 SURGICAL SUPPLY — 17 items
CATH BALLN WEDGE 5F 110CM (CATHETERS) ×3 IMPLANT
CATH INFINITI 5FR MULTPACK ANG (CATHETERS) ×3 IMPLANT
CATH SWAN GANZ 7F STRAIGHT (CATHETERS) ×3 IMPLANT
CATH SWAN GANZ VIP 7.5F (CATHETERS) IMPLANT
GLIDESHEATH SLEND SS 6F .021 (SHEATH) ×3 IMPLANT
KIT HEART LEFT (KITS) ×3 IMPLANT
KIT HEART RIGHT NAMIC (KITS) ×3 IMPLANT
PACK CARDIAC CATHETERIZATION (CUSTOM PROCEDURE TRAY) ×3 IMPLANT
SHEATH FAST CATH BRACH 5F 5CM (SHEATH) ×3 IMPLANT
SHEATH PINNACLE 5F 10CM (SHEATH) ×3 IMPLANT
SHEATH PINNACLE 7F 10CM (SHEATH) ×3 IMPLANT
SYR MEDRAD MARK V 150ML (SYRINGE) ×3 IMPLANT
TRANSDUCER W/STOPCOCK (MISCELLANEOUS) ×3 IMPLANT
TUBING CIL FLEX 10 FLL-RA (TUBING) ×3 IMPLANT
WIRE EMERALD 3MM-J .025X260CM (WIRE) ×3 IMPLANT
WIRE EMERALD 3MM-J .035X150CM (WIRE) ×3 IMPLANT
WIRE SAFE-T 1.5MM-J .035X260CM (WIRE) ×3 IMPLANT

## 2015-07-06 NOTE — Interval H&P Note (Signed)
History and Physical Interval Note:  07/06/2015 7:23 AM  Melinda Gonzales  has presented today for surgery, with the diagnosis of cm - shortness of breath  The various methods of treatment have been discussed with the patient and family. After consideration of risks, benefits and other options for treatment, the patient has consented to  Procedure(s): Right/Left Heart Cath and Coronary Angiography (N/A) as a surgical intervention .  The patient's history has been reviewed, patient examined, no change in status, stable for surgery.  I have reviewed the patient's chart and labs.  Questions were answered to the patient's satisfaction.     Collier Salina [facility] 07/06/2015 7:23 AM

## 2015-07-06 NOTE — H&P (View-Only) (Signed)
Melinda Gonzales Date of Birth: [birth year]   History of Present Illness: [name] is seen for follow up HOCM after recent Echo.  She was evaluated in 2011 with a Myoview stress test which was normal. She had an Echo at that time that showed moderate LVH with EF 65-70%. Mild MR. She does have a history of murmur. She is not a smoker. In early 2016 she presented with increased dyspnea and an Echo and cardiac MRI were done. These with both consistent with HOCM. LVOT gradient of 112 mm Hg at rest. She was started on low dose lasix and Toprol XL with good response initially with improved dyspnea. Seen more recently with symptoms of increased dyspnea. No increased edema or orthopnea but more dyspnea with walking or lifting. No chest pain. No dizziness or palpitations. Energy level has decreased. Repeat Echo done and note below.   Current Outpatient Prescriptions on File Prior to Visit  Medication Sig Dispense Refill  . furosemide (LASIX) 20 MG tablet TAKE 1 TABLET BY MOUTH DAILY 30 tablet 6  . potassium chloride SA (K-DUR,KLOR-CON) 20 MEQ tablet TAKE 1 TABLET BY MOUTH DAILY 30 tablet 1  . rosuvastatin (CRESTOR) 5 MG tablet Take 5 mg by mouth daily.       No current facility-administered medications on file prior to visit.    No Known Allergies  Past Medical History  Diagnosis Date  . Hypercholesterolemia   . Dyspnea   . Heart murmur   . LVH (left ventricular hypertrophy)     WITH NORMAL SYSTOLIC FUNCTION  . SOB (shortness of breath)   . Hypertrophic obstructive cardiomyopathy (HOCM) ([place])     History reviewed. No pertinent past surgical history.  History  Smoking status  . Former Smoker  . Quit date: 05/26/1965  Smokeless tobacco  . Not on file    History  Alcohol Use No    Family History  Problem Relation Age of Onset  . Heart failure Mother 77  . Heart attack Father 9    Review of Systems: As noted in HPI.   All other systems were reviewed and are  negative.  Physical Exam: BP 132/70 mmHg  Pulse 71  Ht 5\' 2"  (1.575 m)  Wt 78.472 kg (173 lb)  BMI 31.63 kg/m2 She is an obese white female in no distress. Her HEENT exam is unremarkable. She has no JVD or bruits. Lungs are clear. Cardiac exam reveals a harsh grade Gonzales/6 systolic murmur at the  right upper sternal border radiating to the apex. Harsh apical murmur as well. There is no S3. Abdomen is soft and nontender. She has no edema. Pedal pulses are excellent. Neurologic exam is nonfocal. Body mass index is 31.63 kg/(m^2).   LABORATORY DATA: Echo: 03/05/14:Transthoracic Echocardiography  Patient:  Melinda, Gonzales MR #:    [id number] Study Date: 03/05/2014 Gender:   F Age:    81 Height:   157.5 cm Weight:   85.7 kg BSA:    1.98 m^2 Pt. Status: Room:  ATTENDING  Melinda Gonzales, M.D. ORDERING   Melinda Gonzales, M.D. REFERRING  Melinda Gonzales, M.D. SONOGRAPHER Melinda Gonzales, RDCS PERFORMING  [facility], Outpatient  cc:  ------------------------------------------------------------------- LV EF: 65% -  70%  ------------------------------------------------------------------- Indications:   SOB (R06.02). Murmur (R06.00).  ------------------------------------------------------------------- History:  PMH: Acquired from the patient and from the patient&'s chart. Dyspnea and Harsh Gonzales/6 systolic murmur RUSB radiating to the apex. Risk factors: Former tobacco use. Hypertension. Obese.  ------------------------------------------------------------------- Study Conclusions  - Left  ventricle: The cavity size was normal. There was severe concentric hypertrophy. Systolic function was vigorous. The estimated ejection fraction was in the range of 65% to 70%. Wall motion was normal; there were no regional wall motion abnormalities. Features are consistent with a pseudonormal left ventricular filling pattern, with concomitant abnormal  relaxation and increased filling pressure (grade 2 diastolic dysfunction). Doppler parameters are consistent with elevated ventricular end-diastolic filling pressure. - Aortic valve: Trileaflet; mildly thickened, mildly calcified leaflets. There was mild regurgitation. - Aortic root: The aortic root was normal in size. - Mitral valve: Structurally normal valve. There was mild regurgitation. - Left atrium: The atrium was mildly dilated. - Right ventricle: The cavity size was normal. Wall thickness was normal. Systolic function was normal. - Right atrium: The atrium was normal in size. - Pulmonic valve: There was trivial regurgitation. - Pulmonary arteries: Systolic pressure was within the normal range. PA peak pressure: 36 mm Hg (S). - Inferior vena cava: The vessel was normal in size. - Pericardium, extracardiac: There was no pericardial effusion.  Impressions:  - There is severe left ventricular hypertrophy with systolic near cavity obliteration and LVOT gradient of 112 mmHg at rest. LVEF is hyperdynamic. There is systolic anterior motion of the mitral valve leaflet. Aortic valve is poorly visualized but appears mildly thickened and calcified, transaortic gradient couldn&'t be assessed sec to significant LVOT gradient. There is mild aortic insufficiency.  A cardiac MRI should be considered for evaluation of hypertrophic cardiomyopathy.  ------------------------------------------------------------------- Labs, prior tests, procedures, and surgery: Echocardiography (October 2011).   EF was 70%.  Transthoracic echocardiography. M-mode, complete 2D, spectral Doppler, and color Doppler. Birthdate: Patient birthdate: [birth year]. Age: Patient is 81 yr old. Sex: Gender: female. BMI: 34.6 kg/m^2. Blood pressure:   120/68 Patient status: Outpatient. Study date: Study date: 03/05/2014. Study time: 09:43 AM. Location: [facility]  Gonzales  -------------------------------------------------------------------  ------------------------------------------------------------------- Left ventricle: The cavity size was normal. There was severe concentric hypertrophy. Systolic function was vigorous. The estimated ejection fraction was in the range of 65% to 70%. Wall motion was normal; there were no regional wall motion abnormalities. Features are consistent with a pseudonormal left ventricular filling pattern, with concomitant abnormal relaxation and increased filling pressure (grade 2 diastolic dysfunction). Doppler parameters are consistent with elevated ventricular end-diastolic filling pressure.  ------------------------------------------------------------------- Aortic valve:  Trileaflet; mildly thickened, mildly calcified leaflets. Mobility was not restricted. Doppler: Transvalvular velocity couldn&'t be assessed. There was mild regurgitation.  ------------------------------------------------------------------- Aorta: Aortic root: The aortic root was normal in size.  ------------------------------------------------------------------- Mitral valve:  Structurally normal valve.  Mobility was not restricted. Doppler: Transvalvular velocity was within the normal range. There was no evidence for stenosis. There was mild regurgitation.  Peak gradient (D): 5 mm Hg.  ------------------------------------------------------------------- Left atrium: The atrium was mildly dilated.  ------------------------------------------------------------------- Right ventricle: The cavity size was normal. Wall thickness was normal. Systolic function was normal.  ------------------------------------------------------------------- Pulmonic valve:  Structurally normal valve.  Cusp separation was normal. Doppler: Transvalvular velocity was within the normal range. There was no evidence for stenosis. There was  trivial regurgitation.  ------------------------------------------------------------------- Tricuspid valve:  Structurally normal valve.  Doppler: Transvalvular velocity was within the normal range. There was mild regurgitation.  ------------------------------------------------------------------- Pulmonary artery:  The main pulmonary artery was normal-sized. Systolic pressure was within the normal range.  ------------------------------------------------------------------- Right atrium: The atrium was normal in size.  ------------------------------------------------------------------- Pericardium: There was no pericardial effusion.  ------------------------------------------------------------------- Systemic veins: Inferior vena cava: The vessel was normal in size.  ------------------------------------------------------------------- Measurements  Left ventricle             Value    Reference LV ID, ED, PLAX chordal    (L)   37.6 mm   43 - 52 LV ID, ES, PLAX chordal    (L)   20.7 mm   23 - 38 LV fx shortening, PLAX chordal     45  %   >=29 LV PW thickness, ED          14.9 mm   --------- IVS/LV PW ratio, ED      (H)   1.38     <=1.Gonzales LV e&', lateral             5.15 cm/s  --------- LV E/e&', lateral            21.94    --------- LV e&', medial             Gonzales.73 cm/s  --------- LV E/e&', medial            30.29    --------- LV e&', average             4.44 cm/s  --------- LV E/e&', average            25.45    ---------  Ventricular septum           Value    Reference IVS thickness, ED           20.5 mm   ---------  Aorta                 Value    Reference Aortic root ID, ED           27  mm   --------- Ascending aorta ID, A-P, S        33  mm   ---------  Left atrium              Value    Reference LA ID, A-P, ES             41  mm   --------- LA ID/bsa, A-P             2.08 cm/m^2 <=2.2 LA volume, S              72  ml   --------- LA volume/bsa, S            36.5 ml/m^2 --------- LA volume, ES, 1-p A4C         73  ml   --------- LA volume/bsa, ES, 1-p A4C       37  ml/m^2 --------- LA volume, ES, 1-p A2C         69  ml   --------- LA volume/bsa, ES, 1-p A2C       34.9 ml/m^2 ---------  Mitral valve              Value    Reference Mitral E-wave peak velocity      113  cm/s  --------- Mitral A-wave peak velocity      65.6 cm/s  --------- Mitral deceleration time    (H)   303  ms   150 - 230 Mitral peak gradient, D        5   mm Hg --------- Mitral E/A ratio, peak         1.7     ---------  Pulmonary arteries           Value  Reference PA pressure, S, DP       (H)   36  mm Hg <=30  Tricuspid valve            Value    Reference Tricuspid regurg peak velocity     288  cm/s  --------- Tricuspid peak RV-RA gradient     33  mm Hg ---------  Systemic veins             Value    Reference Estimated CVP             Gonzales   mm Hg ---------  Right ventricle            Value    Reference RV pressure, S, DP       (H)   36  mm Hg <=30 RV s&', lateral, S           14.8 cm/s  ---------  Legend: (L) and (H) mark values outside specified reference range.  ------------------------------------------------------------------- Prepared and Electronically Authenticated by  Ena Dawley, M.D. 2016-02-04T17:52:12   CARDIAC MRI  TECHNIQUE: The patient was scanned on a 1.5 Tesla GE magnet.  A dedicated cardiac coil was used. Functional imaging was done using Fiesta sequences. 2,Gonzales, and 4 chamber views were done to assess for RWMA's. Modified Simpson's rule using a short axis stack was used to calculate an ejection fraction on a dedicated work Conservation officer, nature. The patient received 30 cc of Multihance. After 10 minutes inversion recovery sequences were used to assess for infiltration and scar tissue.  CONTRAST: 30 cc of Multihance  FINDINGS: 1. Normal left ventricular size with moderate left ventricular hypertrophy with severe hypertrophy of the basal septum and normal systolic function (LVEF = 63%). There are no regional wall motion abnormalities.  There is systolic anterior motion of the anterior leaflet of the mitral valve associated with LVOT gradient.  The measurements are as follows:  LVEDD: 43 mm  LVESD: 29 mm  Basal anteroseptal wall: 20 mm  Basal inferolateral wall: 12 mm  Mid anteroseptal wall: 13 mm  Apical septal wall: 12 mm  Apical lateral wall 11 mm  LVEDV: 95 mm  LVESV: 35 ml  SV: 60 ml  CO: Gonzales.8 L/minute  Myocardial mass: 162 g  2. Normal right ventricular size, thickness and systolic function (RVEF = 56%). There are no regional wall motion abnormalities.  RVEDV: 88 ml  RVESV: 39 ml  Gonzales. Moderate mitral regurgitation with centrally directed jet.  Trivial aortic insufficiency.  4. Mildly dilated left atrium.  5. Normal caliber of the aortic root, aorta and pulmonary artery.  6. There is subendocardial late gadolinium enhancement in the basal inferior and inferolateral walls.  IMPRESSION: 1. Normal left ventricular size with small LV cavity. There is moderate left ventricular hypertrophy with severe hypertrophy of the basal septum and normal systolic function (LVEF = 63%). There are no regional wall motion abnormalities.  There is systolic anterior motion of the  anterior leaflet of the mitral valve associated with LVOT gradient.  Conclusively, these findings are consistent with hypertrophic cardiomyopathy with no late gadolinium enhancement found the in the anteroseptal wall.  2. Normal right ventricular size, thickness and systolic function (RVEF = 56%). There are no regional wall motion abnormalities.  Gonzales. Moderate mitral regurgitation with centrally directed jet. Trivial aortic insufficiency.  4. Mildly dilated left atrium.  5. There is subendocardial late gadolinium enhancement in the basal inferior and inferolateral walls suggestive of prior subendocardial infarct.  Ena Dawley   Electronically Signed  By: Ena Dawley  On: 03/26/2014 21:02  24 hr. Holter monitor 03/05/14: Occasional PAC,  PVC. One 5 beat run of PAT. One 9 beat run of NSVT.   Repeat Echo 06/15/15: Study Conclusions  - Left ventricle: The cavity size was normal. There was severe  asymmetric hypertrophy of the septum. Systolic function was  vigorous. The estimated ejection fraction was in the range of 65%  to 70%. Wall motion was normal; there were no regional wall  motion abnormalities. Features are consistent with a pseudonormal  left ventricular filling pattern, with concomitant abnormal  relaxation and increased filling pressure (grade 2 diastolic  dysfunction). Doppler parameters are consistent with high  ventricular filling pressure. - Aortic valve: Valve mobility was restricted. There was mild  regurgitation. - Mitral valve: There was severe systolic anterior motion. There  was moderate to severe regurgitation. - Left atrium: The atrium was mildly dilated. - Pulmonary arteries: Systolic pressure was moderately increased.  PA peak pressure: 51 mm Hg (S).  Impressions:  - Vigorous LV systolic function; grade 2 diastolic dysfunction with  elevated LV filling pressure; severe asymmetric LVH; mild AI; MV  SAM with peak  LVOT gradient of 6 m/s; moderate to severe MR; mild  LAE; mild TR with moderately elevated pulmonary pressure;  findings c/w HOCM.  ------------------------------------------------------------------- Labs, prior tests, procedures, and surgery: Transthoracic echocardiography (03/05/2014). EF was 70%. LVOT gradiant:112 mmHg.  Transthoracic echocardiography. M-mode, complete 2D, spectral Doppler, and color Doppler. Birthdate: Patient birthdate: 02/24/1934. Age: Patient is 81 yr old. Sex: Gender: female. BMI: 31.9 kg/m^2. Blood pressure: 138/76 Patient status: Outpatient. Study date: Study date: 06/15/2015. Study time: 11:32 AM. Location: [facility]  -------------------------------------------------------------------  ------------------------------------------------------------------- Left ventricle: The cavity size was normal. There was severe asymmetric hypertrophy of the septum. Systolic function was vigorous. The estimated ejection fraction was in the range of 65% to 70%. Wall motion was normal; there were no regional wall motion abnormalities. Features are consistent with a pseudonormal left ventricular filling pattern, with concomitant abnormal relaxation and increased filling pressure (grade 2 diastolic dysfunction). Doppler parameters are consistent with high ventricular filling pressure.  ------------------------------------------------------------------- Aortic valve: Trileaflet; mildly calcified leaflets. Valve mobility was restricted. Doppler: Transvalvular velocity was within the normal range. There was no stenosis. There was mild regurgitation.  ------------------------------------------------------------------- Aorta: Aortic root: The aortic root was normal in size.  ------------------------------------------------------------------- Mitral valve: Mildly thickened leaflets . Mobility was not restricted. There was severe  systolic anterior motion. Doppler: Transvalvular velocity was within the normal range. There was no evidence for stenosis. There was moderate to severe regurgitation.  Peak gradient (D): 5 mm Hg.  ------------------------------------------------------------------- Left atrium: The atrium was mildly dilated.  ------------------------------------------------------------------- Right ventricle: The cavity size was normal. Systolic function was normal.  ------------------------------------------------------------------- Pulmonic valve: Doppler: Transvalvular velocity was within the normal range. There was no evidence for stenosis. There was mild regurgitation.  ------------------------------------------------------------------- Tricuspid valve: Structurally normal valve. Doppler: Transvalvular velocity was within the normal range. There was mild regurgitation.  ------------------------------------------------------------------- Pulmonary artery: Systolic pressure was moderately increased.  ------------------------------------------------------------------- Right atrium: The atrium was normal in size.  ------------------------------------------------------------------- Pericardium: There was no pericardial effusion.  ------------------------------------------------------------------- Systemic veins: Inferior vena cava: The vessel was normal in size.  ------------------------------------------------------------------- Measurements  Left ventricle Value Reference LV ID, ED, PLAX chordal (L) 39.6 mm 43 - 52 LV ID, ES, PLAX chordal 28.Gonzales mm 23 - 38 LV fx shortening, PLAX chordal 29 % >=29 LV PW thickness,  ED 12 mm --------- IVS/LV PW ratio, ED (H) 1.51 <=1.Gonzales LV e&', lateral 8.78  cm/s --------- LV E/e&', lateral 13.1 --------- LV e&', medial Gonzales.81 cm/s --------- LV E/e&', medial 30.18 --------- LV e&', average 6.Gonzales cm/s --------- LV E/e&', average 18.27 ---------  Ventricular septum Value Reference IVS thickness, ED 18.1 mm ---------  LVOT Value Reference LVOT ID, S 15 mm --------- LVOT area 1.77 cm^2 ---------  Aortic valve Value Reference Aortic regurg pressure half-time 495 ms ---------  Aorta Value Reference Aortic root ID, ED 29 mm --------- Ascending aorta ID, A-P, S 32 mm ---------  Left atrium Value Reference LA ID, A-P, ES 41 mm --------- LA ID/bsa, A-P 2.17 cm/m^2 <=2.2 LA volume, S 65.1 ml --------- LA volume/bsa, S 34.4 ml/m^2 --------- LA volume, ES, 1-p A4C 63.6 ml --------- LA volume/bsa, ES, 1-p A4C 33.6 ml/m^2 --------- LA volume, ES, 1-p A2C 64.4 ml --------- LA volume/bsa, ES, 1-p A2C 34.1 ml/m^2 ---------  Mitral valve Value Reference Mitral E-wave peak velocity 115 cm/s --------- Mitral A-wave peak velocity 67.4 cm/s --------- Mitral deceleration time (H) 320 ms 150 - 230 Mitral peak gradient, D 5 mm Hg  --------- Mitral E/A ratio, peak 1.7 --------- Mitral regurg VTI, PISA 242 cm --------- Mitral ERO, PISA 0.16 cm^2 --------- Mitral regurg volume, PISA 39 ml ---------  Pulmonary arteries Value Reference PA pressure, S, DP (H) 51 mm Hg <=30  Tricuspid valve Value Reference Tricuspid regurg peak velocity 326 cm/s --------- Tricuspid peak RV-RA gradient 43 mm Hg ---------  Systemic veins Value Reference Estimated CVP 8 mm Hg ---------  Right ventricle Value Reference RV pressure, S, DP (H) 51 mm Hg <=30 RV s&', lateral, S 12 cm/s ---------  Pulmonic valve Value Reference Pulmonic regurg velocity, ED 126 cm/s --------- Pulmonic regurg gradient, ED 6 mm Hg ---------  Legend: (L) and (H) mark values outside specified reference range.  ------------------------------------------------------------------- Prepared and Electronically Authenticated by  Kirk Ruths 2017-05-16T15:00:39  Lab Results  Component Value Date   WBC 6.7 05/19/2015   HGB 12.6 05/19/2015   HCT 37.Gonzales 05/19/2015   PLT 302 05/19/2015   GLUCOSE 114* 05/19/2015   CHOL 155 05/19/2015   TRIG 151* 05/19/2015   HDL 37* 05/19/2015   LDLCALC 88 05/19/2015   ALT 5* 05/19/2015   AST 13 05/19/2015   NA 139 05/19/2015   K 4.1 05/19/2015   CL 108 05/19/2015   CREATININE 1.22* 05/19/2015   BUN 20 05/19/2015   CO2 19* 05/19/2015     Assessment / Plan: 1. HOCM. Classic morphologic features by Echo and MRI. MRI also shows some subendocardial scar consistent with HCM.   Initially improved on Toprol and lasix but now symptoms of dyspnea are worse. Currently has  class 2-Gonzales symptoms. Echo shows increased LVOT gradient on medication and more SAM and MR. She has evidence of pulmonary HTN. I recommend a right and left heart cath with possible referral for septal myectomy. Will schedule cardiac cath for the first week of June. Will increase Toprol XL to 100 mg in am and 50 mg in pm. Although she is almost 82 she is still in good overall health and I think would benefit from surgery. We also discussed possible alcohol septal ablation but I think she would get a better result with surgery. The procedure and risks were reviewed including but not limited to death, myocardial infarction, stroke, arrythmias, bleeding, transfusion, emergency surgery, dye allergy, or renal dysfunction. The patient voices understanding and is agreeable to proceed.Marland Kitchen  2. Murmur. Due to LVOT gradient and mod- severe  MR.   Gonzales. Palpitations. NSVT noted on monitor. Symptoms resolved on beta blocker.  If she develops syncope or presyncope she will need to see EP.  4. Obesity.  5. Hypercholesterolemia.

## 2015-07-06 NOTE — Discharge Instructions (Addendum)
Increase lasix to 40 mg po daily.   Angiogram, Care After These instructions give you information about caring for yourself after your procedure. Your doctor may also give you more specific instructions. Call your doctor if you have any problems or questions after your procedure.  HOME CARE  Take medicines only as told by your doctor.  Follow your doctor's instructions about:  Care of the area where the tube was inserted.  Bandage (dressing) changes and removal.  You may shower 24-48 hours after the procedure or as told by your doctor.  Do not take baths, swim, or use a hot tub until your doctor approves.  Every day, check the area where the tube was inserted. Watch for:  Redness, swelling, or pain.  Fluid, blood, or pus.  Do not apply powder or lotion to the site.  Do not lift anything that is heavier than 10 lb (4.5 kg) for 5 days or as told by your doctor.  Ask your doctor when you can:  Return to work or school.  Do physical activities or play sports.  Have sex.  Do not drive or operate heavy machinery for 24 hours or as told by your doctor.  Have someone with you for the first 24 hours after the procedure.  Keep all follow-up visits as told by your doctor. This is important. GET HELP IF:  You have a fever.   You have chills.   You have more bleeding from the area where the tube was inserted. Hold pressure on the area.  You have redness, swelling, or pain in the area where the tube was inserted.  You have fluid or pus coming from the area. GET HELP RIGHT AWAY IF:   You have a lot of pain in the area where the tube was inserted.  The area where the tube was inserted is bleeding, and the bleeding does not stop after 30 minutes of holding steady pressure on the area.  The area near or just beyond the insertion site becomes pale, cool, tingly, or numb.   This information is not intended to replace advice given to you by your health care provider. Make  sure you discuss any questions you have with your health care provider.   Document Released: 04/14/2008 Document Revised: 02/06/2014 Document Reviewed: 06/19/2012 Elsevier Interactive Patient Education [employer].

## 2015-07-06 NOTE — Progress Notes (Signed)
Site area: RFA/RFV Site Prior to Removal:  Level 0 Pressure Applied For:101min Manual:   yes Patient Status During Pull:  stable Post Pull Site:  Level 0 Post Pull Instructions Given:  yes Post Pull Pulses Present: palpable Dressing Applied:  tegaderm Bedrest begins @ [id number] Comments:

## 2015-07-07 ENCOUNTER — Encounter (HOSPITAL_COMMUNITY): Payer: Self-pay | Admitting: Cardiology

## 2015-07-07 MED FILL — Verapamil HCl IV Soln 2.5 MG/ML: INTRAVENOUS | Qty: 2 | Status: AC

## 2015-07-08 ENCOUNTER — Ambulatory Visit (INDEPENDENT_AMBULATORY_CARE_PROVIDER_SITE_OTHER): Payer: Medicare Other | Admitting: Cardiology

## 2015-07-08 ENCOUNTER — Encounter: Payer: Self-pay | Admitting: Cardiology

## 2015-07-08 VITALS — BP 122/60 | HR 77 | Ht 62.0 in | Wt 172.0 lb

## 2015-07-08 DIAGNOSIS — Z0000 Encounter for general adult medical examination without abnormal findings: Secondary | ICD-10-CM | POA: Diagnosis not present

## 2015-07-08 DIAGNOSIS — I472 Ventricular tachycardia: Secondary | ICD-10-CM

## 2015-07-08 DIAGNOSIS — E7800 Pure hypercholesterolemia, unspecified: Secondary | ICD-10-CM | POA: Diagnosis not present

## 2015-07-08 DIAGNOSIS — I4729 Other ventricular tachycardia: Secondary | ICD-10-CM

## 2015-07-08 DIAGNOSIS — I421 Obstructive hypertrophic cardiomyopathy: Secondary | ICD-10-CM

## 2015-07-08 DIAGNOSIS — I420 Dilated cardiomyopathy: Secondary | ICD-10-CM | POA: Diagnosis not present

## 2015-07-08 DIAGNOSIS — K219 Gastro-esophageal reflux disease without esophagitis: Secondary | ICD-10-CM | POA: Diagnosis not present

## 2015-07-08 DIAGNOSIS — K580 Irritable bowel syndrome with diarrhea: Secondary | ICD-10-CM | POA: Diagnosis not present

## 2015-07-08 DIAGNOSIS — N183 Chronic kidney disease, stage 3 (moderate): Secondary | ICD-10-CM | POA: Diagnosis not present

## 2015-07-08 DIAGNOSIS — I5032 Chronic diastolic (congestive) heart failure: Secondary | ICD-10-CM

## 2015-07-08 DIAGNOSIS — R011 Cardiac murmur, unspecified: Secondary | ICD-10-CM | POA: Diagnosis not present

## 2015-07-08 NOTE — Progress Notes (Signed)
[name] Date of Birth: February 09, 1934   History of Present Illness: Mrs. Norma Fredrickson is seen for follow up HOCM after recent cardiac cath.  She was evaluated in 2011 with a Myoview stress test which was normal. She had an Echo at that time that showed moderate LVH with EF 65-70%. Mild MR. She does have a history of murmur. She is not a smoker. In early 2016 she presented with increased dyspnea and an Echo and cardiac MRI were done. These with both consistent with HOCM. LVOT gradient of 112 mm Hg at rest. She was started on low dose lasix and Toprol XL with good response initially with improved dyspnea. Seen more recently with symptoms of increased dyspnea. No increased edema or orthopnea but more dyspnea with walking or lifting. No chest pain. No dizziness or palpitations. Energy level has decreased. Repeat Echo done and note below. She underwent cardiac cath with results noted below.   Current Outpatient Prescriptions on File Prior to Visit  Medication Sig Dispense Refill  . furosemide (LASIX) 20 MG tablet Take 2 tablets (40 mg total) by mouth daily. 30 tablet 10  . metoprolol succinate (TOPROL-XL) 100 MG 24 hr tablet TAKE 100 MG IN AM 50 MG IN PM 60 tablet 5  . multivitamin-lutein (OCUVITE-LUTEIN) CAPS capsule Take 1 capsule by mouth daily.    . potassium chloride SA (K-DUR,KLOR-CON) 20 MEQ tablet TAKE 1 TABLET BY MOUTH DAILY 30 tablet 10  . rosuvastatin (CRESTOR) 5 MG tablet Take 5 mg by mouth daily.      . Turmeric Curcumin 500 MG CAPS Take 1,000 capsules by mouth daily.     No current facility-administered medications on file prior to visit.    No Known Allergies  Past Medical History  Diagnosis Date  . Hypercholesterolemia   . Dyspnea   . Heart murmur   . LVH (left ventricular hypertrophy)     WITH NORMAL SYSTOLIC FUNCTION  . SOB (shortness of breath)   . Hypertrophic obstructive cardiomyopathy (HOCM) ([place])   . Chronic diastolic CHF (congestive heart failure) ([place])  07/06/2015    Past Surgical History  Procedure Laterality Date  . Cardiac catheterization N/A 07/06/2015    Procedure: Right/Left Heart Cath and Coronary Angiography;  Surgeon: Doyce Stonehouse M [place], MD;  Location: [facility];  Service: Cardiovascular;  Laterality: N/A;    History  Smoking status  . Former Smoker  . Quit date: 05/26/1965  Smokeless tobacco  . Not on file    History  Alcohol Use No    Family History  Problem Relation Age of Onset  . Heart failure Mother 13  . Heart attack Father 4    Review of Systems: As noted in HPI.   All other systems were reviewed and are negative.  Physical Exam: BP 122/60 mmHg  Pulse 77  Ht 5\' 2"  (1.575 m)  Wt 172 lb (78.019 kg)  BMI 31.45 kg/m2 She is an obese white female in no distress. Her HEENT exam is unremarkable. She has no JVD or bruits. Lungs are clear. Cardiac exam reveals a harsh grade 3/6 systolic murmur at the  right upper sternal border radiating to the apex. Harsh apical murmur as well. There is no S3. Abdomen is soft and nontender. She has no edema. Pedal pulses are excellent. No groin hematoma at cath site. Neurologic exam is nonfocal. Body mass index is 31.45 kg/(m^2).   LABORATORY DATA: Echo: 03/05/14:Transthoracic Echocardiography  Patient:  Glorine, Gerardo MR #:  [id number] Study Date: 03/05/2014 Gender:   F Age:    35 Height:   157.5 cm Weight:   85.7 kg BSA:    1.98 m^2 Pt. Status: Room:  ATTENDING  Madden Piazza [place], M.D. ORDERING   Pura Picinich [place], M.D. REFERRING  Talicia Sui [place], M.D. SONOGRAPHER Victorio Palm, RDCS PERFORMING  [facility], Outpatient  cc:  ------------------------------------------------------------------- LV EF: 65% -  70%  ------------------------------------------------------------------- Indications:   SOB (R06.02). Murmur (R06.00).  ------------------------------------------------------------------- History:  PMH: Acquired from the patient  and from the patient&'s chart. Dyspnea and Harsh 3/6 systolic murmur RUSB radiating to the apex. Risk factors: Former tobacco use. Hypertension. Obese.  ------------------------------------------------------------------- Study Conclusions  - Left ventricle: The cavity size was normal. There was severe concentric hypertrophy. Systolic function was vigorous. The estimated ejection fraction was in the range of 65% to 70%. Wall motion was normal; there were no regional wall motion abnormalities. Features are consistent with a pseudonormal left ventricular filling pattern, with concomitant abnormal relaxation and increased filling pressure (grade 2 diastolic dysfunction). Doppler parameters are consistent with elevated ventricular end-diastolic filling pressure. - Aortic valve: Trileaflet; mildly thickened, mildly calcified leaflets. There was mild regurgitation. - Aortic root: The aortic root was normal in size. - Mitral valve: Structurally normal valve. There was mild regurgitation. - Left atrium: The atrium was mildly dilated. - Right ventricle: The cavity size was normal. Wall thickness was normal. Systolic function was normal. - Right atrium: The atrium was normal in size. - Pulmonic valve: There was trivial regurgitation. - Pulmonary arteries: Systolic pressure was within the normal range. PA peak pressure: 36 mm Hg (S). - Inferior vena cava: The vessel was normal in size. - Pericardium, extracardiac: There was no pericardial effusion.  Impressions:  - There is severe left ventricular hypertrophy with systolic near cavity obliteration and LVOT gradient of 112 mmHg at rest. LVEF is hyperdynamic. There is systolic anterior motion of the mitral valve leaflet. Aortic valve is poorly visualized but appears mildly thickened and calcified, transaortic gradient couldn&'t be assessed sec to significant LVOT gradient. There is mild aortic  insufficiency.  A cardiac MRI should be considered for evaluation of hypertrophic cardiomyopathy.  ------------------------------------------------------------------- Labs, prior tests, procedures, and surgery: Echocardiography (October 2011).   EF was 70%.  Transthoracic echocardiography. M-mode, complete 2D, spectral Doppler, and color Doppler. Birthdate: Patient birthdate: 06-03-1934. Age: Patient is 81 yr old. Sex: Gender: female. BMI: 34.6 kg/m^2. Blood pressure:   120/68 Patient status: Outpatient. Study date: Study date: 03/05/2014. Study time: 09:43 AM. Location: [facility]  -------------------------------------------------------------------  ------------------------------------------------------------------- Left ventricle: The cavity size was normal. There was severe concentric hypertrophy. Systolic function was vigorous. The estimated ejection fraction was in the range of 65% to 70%. Wall motion was normal; there were no regional wall motion abnormalities. Features are consistent with a pseudonormal left ventricular filling pattern, with concomitant abnormal relaxation and increased filling pressure (grade 2 diastolic dysfunction). Doppler parameters are consistent with elevated ventricular end-diastolic filling pressure.  ------------------------------------------------------------------- Aortic valve:  Trileaflet; mildly thickened, mildly calcified leaflets. Mobility was not restricted. Doppler: Transvalvular velocity couldn&'t be assessed. There was mild regurgitation.  ------------------------------------------------------------------- Aorta: Aortic root: The aortic root was normal in size.  ------------------------------------------------------------------- Mitral valve:  Structurally normal valve.  Mobility was not restricted. Doppler: Transvalvular velocity was within the normal range. There was no evidence for stenosis.  There was mild regurgitation.  Peak gradient (D): 5 mm Hg.  ------------------------------------------------------------------- Left atrium: The atrium was mildly dilated.  ------------------------------------------------------------------- Right ventricle: The cavity  size was normal. Wall thickness was normal. Systolic function was normal.  ------------------------------------------------------------------- Pulmonic valve:  Structurally normal valve.  Cusp separation was normal. Doppler: Transvalvular velocity was within the normal range. There was no evidence for stenosis. There was trivial regurgitation.  ------------------------------------------------------------------- Tricuspid valve:  Structurally normal valve.  Doppler: Transvalvular velocity was within the normal range. There was mild regurgitation.  ------------------------------------------------------------------- Pulmonary artery:  The main pulmonary artery was normal-sized. Systolic pressure was within the normal range.  ------------------------------------------------------------------- Right atrium: The atrium was normal in size.  ------------------------------------------------------------------- Pericardium: There was no pericardial effusion.  ------------------------------------------------------------------- Systemic veins: Inferior vena cava: The vessel was normal in size.  ------------------------------------------------------------------- Measurements  Left ventricle             Value    Reference LV ID, ED, PLAX chordal    (L)   37.6 mm   43 - 52 LV ID, ES, PLAX chordal    (L)   20.7 mm   23 - 38 LV fx shortening, PLAX chordal     45  %   >=29 LV PW thickness, ED          14.9 mm   --------- IVS/LV PW ratio, ED      (H)   1.38     <=1.3 LV e&', lateral             5.15 cm/s  --------- LV E/e&',  lateral            21.94    --------- LV e&', medial             3.73 cm/s  --------- LV E/e&', medial            30.29    --------- LV e&', average             4.44 cm/s  --------- LV E/e&', average            25.45    ---------  Ventricular septum           Value    Reference IVS thickness, ED           20.5 mm   ---------  Aorta                 Value    Reference Aortic root ID, ED           27  mm   --------- Ascending aorta ID, A-P, S       33  mm   ---------  Left atrium              Value    Reference LA ID, A-P, ES             41  mm   --------- LA ID/bsa, A-P             2.08 cm/m^2 <=2.2 LA volume, S              72  ml   --------- LA volume/bsa, S            36.5 ml/m^2 --------- LA volume, ES, 1-p A4C         73  ml   --------- LA volume/bsa, ES, 1-p A4C       37  ml/m^2 --------- LA volume, ES, 1-p A2C         69  ml   --------- LA volume/bsa, ES, 1-p A2C       34.9 ml/m^2 ---------  Mitral valve              Value    Reference Mitral E-wave peak velocity      113  cm/s  --------- Mitral A-wave peak velocity      65.6 cm/s  --------- Mitral deceleration time    (H)   303  ms   150 - 230 Mitral peak gradient, D        5   mm Hg --------- Mitral E/A ratio, peak         1.7     ---------  Pulmonary arteries           Value    Reference PA pressure, S, DP       (H)   36  mm Hg <=30  Tricuspid valve            Value    Reference Tricuspid regurg peak velocity     288  cm/s  --------- Tricuspid peak RV-RA gradient     33  mm Hg ---------  Systemic veins              Value    Reference Estimated CVP             3   mm Hg ---------  Right ventricle            Value    Reference RV pressure, S, DP       (H)   36  mm Hg <=30 RV s&', lateral, S           14.8 cm/s  ---------  Legend: (L) and (H) mark values outside specified reference range.  ------------------------------------------------------------------- Prepared and Electronically Authenticated by  Ena Dawley, M.D. 2016-02-04T17:52:12   CARDIAC MRI  TECHNIQUE: The patient was scanned on a 1.5 Tesla GE magnet. A dedicated cardiac coil was used. Functional imaging was done using Fiesta sequences. 2,3, and 4 chamber views were done to assess for RWMA's. Modified Simpson's rule using a short axis stack was used to calculate an ejection fraction on a dedicated work Conservation officer, nature. The patient received 30 cc of Multihance. After 10 minutes inversion recovery sequences were used to assess for infiltration and scar tissue.  CONTRAST: 30 cc of Multihance  FINDINGS: 1. Normal left ventricular size with moderate left ventricular hypertrophy with severe hypertrophy of the basal septum and normal systolic function (LVEF = 63%). There are no regional wall motion abnormalities.  There is systolic anterior motion of the anterior leaflet of the mitral valve associated with LVOT gradient.  The measurements are as follows:  LVEDD: 43 mm  LVESD: 29 mm  Basal anteroseptal wall: 20 mm  Basal inferolateral wall: 12 mm  Mid anteroseptal wall: 13 mm  Apical septal wall: 12 mm  Apical lateral wall 11 mm  LVEDV: 95 mm  LVESV: 35 ml  SV: 60 ml  CO: 3.8 L/minute  Myocardial mass: 162 g  2. Normal right ventricular size, thickness and systolic function (RVEF = 56%). There are no regional wall motion abnormalities.  RVEDV: 88 ml  RVESV: 39 ml  3.  Moderate mitral regurgitation with centrally directed jet.  Trivial aortic insufficiency.  4. Mildly dilated left atrium.  5. Normal caliber of the aortic root, aorta and pulmonary artery.  6. There is subendocardial late gadolinium enhancement in the basal inferior and inferolateral walls.  IMPRESSION: 1. Normal left ventricular size with small LV cavity. There is moderate left ventricular hypertrophy with severe  hypertrophy of the basal septum and normal systolic function (LVEF = 63%). There are no regional wall motion abnormalities.  There is systolic anterior motion of the anterior leaflet of the mitral valve associated with LVOT gradient.  Conclusively, these findings are consistent with hypertrophic cardiomyopathy with no late gadolinium enhancement found the in the anteroseptal wall.  2. Normal right ventricular size, thickness and systolic function (RVEF = 56%). There are no regional wall motion abnormalities.  3. Moderate mitral regurgitation with centrally directed jet. Trivial aortic insufficiency.  4. Mildly dilated left atrium.  5. There is subendocardial late gadolinium enhancement in the basal inferior and inferolateral walls suggestive of prior subendocardial infarct.  Ena Dawley   Electronically Signed  By: Ena Dawley  On: 03/26/2014 21:02  24 hr. Holter monitor 03/05/14: Occasional PAC,  PVC. One 5 beat run of PAT. One 9 beat run of NSVT.   Repeat Echo 06/15/15: Study Conclusions  - Left ventricle: The cavity size was normal. There was severe  asymmetric hypertrophy of the septum. Systolic function was  vigorous. The estimated ejection fraction was in the range of 65%  to 70%. Wall motion was normal; there were no regional wall  motion abnormalities. Features are consistent with a pseudonormal  left ventricular filling pattern, with concomitant abnormal  relaxation and increased filling pressure (grade 2  diastolic  dysfunction). Doppler parameters are consistent with high  ventricular filling pressure. - Aortic valve: Valve mobility was restricted. There was mild  regurgitation. - Mitral valve: There was severe systolic anterior motion. There  was moderate to severe regurgitation. - Left atrium: The atrium was mildly dilated. - Pulmonary arteries: Systolic pressure was moderately increased.  PA peak pressure: 51 mm Hg (S).  Impressions:  - Vigorous LV systolic function; grade 2 diastolic dysfunction with  elevated LV filling pressure; severe asymmetric LVH; mild AI; MV  SAM with peak LVOT gradient of 6 m/s; moderate to severe MR; mild  LAE; mild TR with moderately elevated pulmonary pressure;  findings c/w HOCM.  ------------------------------------------------------------------- Labs, prior tests, procedures, and surgery: Transthoracic echocardiography (03/05/2014). EF was 70%. LVOT gradiant:112 mmHg.  Transthoracic echocardiography. M-mode, complete 2D, spectral Doppler, and color Doppler. Birthdate: Patient birthdate: 05/22/1934. Age: Patient is 81 yr old. Sex: Gender: female. BMI: 31.9 kg/m^2. Blood pressure: 138/76 Patient status: Outpatient. Study date: Study date: 06/15/2015. Study time: 11:32 AM. Location: [facility]  -------------------------------------------------------------------  ------------------------------------------------------------------- Left ventricle: The cavity size was normal. There was severe asymmetric hypertrophy of the septum. Systolic function was vigorous. The estimated ejection fraction was in the range of 65% to 70%. Wall motion was normal; there were no regional wall motion abnormalities. Features are consistent with a pseudonormal left ventricular filling pattern, with concomitant abnormal relaxation and increased filling pressure (grade 2 diastolic dysfunction). Doppler parameters are consistent  with high ventricular filling pressure.  ------------------------------------------------------------------- Aortic valve: Trileaflet; mildly calcified leaflets. Valve mobility was restricted. Doppler: Transvalvular velocity was within the normal range. There was no stenosis. There was mild regurgitation.  ------------------------------------------------------------------- Aorta: Aortic root: The aortic root was normal in size.  ------------------------------------------------------------------- Mitral valve: Mildly thickened leaflets . Mobility was not restricted. There was severe systolic anterior motion. Doppler: Transvalvular velocity was within the normal range. There was no evidence for stenosis. There was moderate to severe regurgitation.  Peak gradient (D): 5 mm Hg.  ------------------------------------------------------------------- Left atrium: The atrium was mildly dilated.  ------------------------------------------------------------------- Right ventricle: The cavity size was normal. Systolic function was normal.  ------------------------------------------------------------------- Pulmonic valve: Doppler:  Transvalvular velocity was within the normal range. There was no evidence for stenosis. There was mild regurgitation.  ------------------------------------------------------------------- Tricuspid valve: Structurally normal valve. Doppler: Transvalvular velocity was within the normal range. There was mild regurgitation.  ------------------------------------------------------------------- Pulmonary artery: Systolic pressure was moderately increased.  ------------------------------------------------------------------- Right atrium: The atrium was normal in size.  ------------------------------------------------------------------- Pericardium: There was no pericardial  effusion.  ------------------------------------------------------------------- Systemic veins: Inferior vena cava: The vessel was normal in size.  ------------------------------------------------------------------- Measurements  Left ventricle Value Reference LV ID, ED, PLAX chordal (L) 39.6 mm 43 - 52 LV ID, ES, PLAX chordal 28.3 mm 23 - 38 LV fx shortening, PLAX chordal 29 % >=29 LV PW thickness, ED 12 mm --------- IVS/LV PW ratio, ED (H) 1.51 <=1.3 LV e&', lateral 8.78 cm/s --------- LV E/e&', lateral 13.1 --------- LV e&', medial 3.81 cm/s --------- LV E/e&', medial 30.18 --------- LV e&', average 6.3 cm/s --------- LV E/e&', average 18.27 ---------  Ventricular septum Value Reference IVS thickness, ED 18.1 mm ---------  LVOT Value Reference LVOT ID, S 15 mm --------- LVOT area 1.77 cm^2 ---------  Aortic valve Value Reference Aortic regurg pressure half-time 495 ms ---------  Aorta Value Reference Aortic root ID, ED 29 mm --------- Ascending aorta ID, A-P, S 32 mm ---------  Left atrium Value Reference LA ID, A-P, ES 41 mm --------- LA ID/bsa, A-P 2.17 cm/m^2 <=2.2 LA volume, S 65.1  ml --------- LA volume/bsa, S 34.4 ml/m^2 --------- LA volume, ES, 1-p A4C 63.6 ml --------- LA volume/bsa, ES, 1-p A4C 33.6 ml/m^2 --------- LA volume, ES, 1-p A2C 64.4 ml --------- LA volume/bsa, ES, 1-p A2C 34.1 ml/m^2 ---------  Mitral valve Value Reference Mitral E-wave peak velocity 115 cm/s --------- Mitral A-wave peak velocity 67.4 cm/s --------- Mitral deceleration time (H) 320 ms 150 - 230 Mitral peak gradient, D 5 mm Hg --------- Mitral E/A ratio, peak 1.7 --------- Mitral regurg VTI, PISA 242 cm --------- Mitral ERO, PISA 0.16 cm^2 --------- Mitral regurg volume, PISA 39 ml ---------  Pulmonary arteries Value Reference PA pressure, S, DP (H) 51 mm Hg <=30  Tricuspid valve Value Reference Tricuspid regurg peak velocity 326 cm/s --------- Tricuspid peak RV-RA gradient 43 mm Hg ---------  Systemic veins Value Reference Estimated CVP 8 mm Hg ---------  Right ventricle Value Reference RV pressure, S, DP (H) 51 mm Hg <=30 RV s&', lateral, S 12 cm/s ---------  Pulmonic valve Value Reference Pulmonic regurg velocity, ED 126 cm/s --------- Pulmonic regurg gradient, ED 6 mm Hg ---------  Legend: (L) and (H) mark values outside specified reference  range.  ------------------------------------------------------------------- Prepared and Electronically Authenticated by  Kirk Ruths 2017-05-16T15:00:39  Procedures    Right/Left Heart Cath and Coronary Angiography    Conclusion     Ost LAD to Prox LAD lesion, 25% stenosed.  Ost 1st Diag to 1st Diag lesion, 40% stenosed.  Prox RCA to Mid RCA lesion, 20% stenosed.  1. Nonobstructive CAD 2. Moderate to severe pulmonary HTN with elevated LV filling pressures. 3. Good cardiac output.  4. Mild LV gradient by pullback. Significant post PVC increase in gradient consistent with dynamic outflow obstruction.   Plan: consider surgical septal myectomy      Indications    Hypertrophic obstructive cardiomyopathy ([place]) [I42.1 (ICD-10-CM)]    Technique and Indications    Indication: 81 yo WF with hypertrophic cardiomyopathy and progressive CHF  Procedural Details: The right groin was prepped, draped, and anesthetized with 1% lidocaine. Using the modified Seldinger technique a 5 Fr sheath was  placed in the right femoral artery and a 7 French sheath was placed in the right femoral vein. A Swan-Ganz catheter was used for the right heart catheterization. Standard protocol was followed for recording of right heart pressures and sampling of oxygen saturations. Fick cardiac output was calculated. Standard Judkins catheters were used for selective coronary angiography and left ventriculography. There were no immediate procedural complications. The patient was transferred to the post catheterization recovery area for further monitoring.  Contrast: 50 cc  During this procedure the patient is administered a total of Versed 1 mg and Fentanyl 25 mg to achieve and maintain moderate conscious sedation. The patient's heart rate, blood pressure, and oxygen saturation are monitored continuously during the procedure. The period of conscious sedation is 37 minutes, of which I was present  face-to-face 100% of this time. Estimated blood loss <50 mL. There were no immediate complications during the procedure.    Coronary Findings    Dominance: Right   Left Main  Vessel was injected. Vessel is normal in caliber. Vessel is angiographically normal.     Left Anterior Descending   . Ost LAD to Prox LAD lesion, 25% stenosed. Diffuse.   . First Diagonal Branch   . Ost 1st Diag to 1st Diag lesion, 40% stenosed.     Left Circumflex  Vessel was injected. Vessel is large. Vessel is angiographically normal.   . Third Obtuse Marginal Branch   The vessel is large in size.     Right Coronary Artery   . Prox RCA to Mid RCA lesion, 20% stenosed.       Right Heart Pressures Hemodynamic findings consistent with moderate pulmonary hypertension. Elevated LV EDP consistent with volume overload.    Coronary Diagrams    Diagnostic Diagram            Implants     No implant documentation for this case.    PACS Images    Show images for Cardiac catheterization     Link to Procedure Log    Procedure Log      Hemo Data       Most Recent Value   Fick Cardiac Output  3.81 L/min   Fick Cardiac Output Index  2.12 (L/min)/BSA   RA A Wave  10 mmHg   RA V Wave  5 mmHg   RA Mean  5 mmHg   RV Systolic Pressure  70 mmHg   RV Diastolic Pressure  3 mmHg   RV EDP  10 mmHg   PA Systolic Pressure  64 mmHg   PA Diastolic Pressure  24 mmHg   PA Mean  40 mmHg   PW A Wave  23 mmHg   PW V Wave  30 mmHg   PW Mean  24 mmHg   AO Systolic Pressure  0000000 mmHg   AO Diastolic Pressure  73 mmHg   AO Mean  123XX123 mmHg   LV Systolic Pressure  123XX123 mmHg   LV Diastolic Pressure  12 mmHg   LV EDP  20 mmHg   Arterial Occlusion Pressure Extended Systolic Pressure  [id number] mmHg   Arterial Occlusion Pressure Extended Diastolic Pressure  70 mmHg   Arterial Occlusion Pressure Extended Mean Pressure  110 mmHg   Left Ventricular Apex Extended Systolic Pressure  0000000  mmHg   Left Ventricular Apex Extended Diastolic Pressure  12 mmHg   Left Ventricular Apex Extended EDP Pressure  19 mmHg   QP/QS  1   TPVR Index  18.91 HRUI  TSVR Index  51.08 HRUI   PVR SVR Ratio  0.16   TPVR/TSVR Ratio  0.37    Order-Level Documents:    There are no order-level documents.    Encounter-Level Documents - 07/06/2015:      Scan on 07/08/2015 10:24 AM by Provider Default, MDScan on 07/08/2015 10:24 AM by Provider Default, MD     Scan on 07/08/2015 10:21 AM by Provider Default, MDScan on 07/08/2015 10:21 AM by Provider Default, MD     Scan on 07/08/2015 10:19 AM by Provider Default, MDScan on 07/08/2015 10:19 AM by Provider Default, MD     Scan on 07/06/2015 8:16 AM by Provider Default, MDScan on 07/06/2015 8:16 AM by Provider Default, MD     Electronic signature on 07/06/2015 5:30 AM    Signed    Electronically signed by Saranya Harlin M Martinique, MD on 07/06/15 at 0826 EDT     Lab Results  Component Value Date   WBC 6.5 07/01/2015   HGB 12.5 07/01/2015   HCT 37.6 07/01/2015   PLT 282 07/01/2015   GLUCOSE 112* 07/01/2015   CHOL 155 05/19/2015   TRIG 151* 05/19/2015   HDL 37* 05/19/2015   LDLCALC 88 05/19/2015   ALT 5* 05/19/2015   AST 13 05/19/2015   NA 141 07/01/2015   K 4.1 07/01/2015   CL 109 07/01/2015   CREATININE 1.13* 07/01/2015   BUN 19 07/01/2015   CO2 21 07/01/2015   INR 1.03 07/06/2015     Assessment / Plan: 1. HOCM. Classic morphologic features by Echo and MRI. MRI also shows some subendocardial scar consistent with HCM.  Initially improved on Toprol and lasix but now symptoms of dyspnea are worse. Currently has  class 3 symptoms. Echo shows increased LVOT gradient on medication and more SAM and MR. She has evidence of pulmonary HTN- confirmed on cardiac cath with elevated LV filling pressures. Lasix dose increased. Will make referral for septal myectomy at [facility] clinic.  Although she is almost 46 she is still in good overall health and I  think would benefit from surgery.   2. Murmur. Due to LVOT gradient and mod- severe  MR.   3. Palpitations. NSVT noted on monitor. Symptoms resolved on beta blocker.  If she develops syncope or presyncope she will need to see EP.  4. Obesity.  5. Hypercholesterolemia.

## 2015-07-16 ENCOUNTER — Telehealth: Payer: Self-pay | Admitting: Cardiology

## 2015-07-16 NOTE — Telephone Encounter (Signed)
Returned call to patient Dr.Jordan's recommendations given. 

## 2015-07-16 NOTE — Telephone Encounter (Signed)
She wants to know if all of her test results have been sent to the hospital in [place]?

## 2015-07-16 NOTE — Telephone Encounter (Signed)
Returned call to patient.She wanted to ask Dr.Jordan if ok to have dental cleaning 07/27/15 and if ok to fly to [place] instead of driving.Records have not been mailed to [facility] waiting on CD's.Advised I am having CD's burned today.I will pick up and have records mailed.

## 2015-07-16 NOTE — Telephone Encounter (Signed)
Patient's records,CD of cardiac cath,CD of cardiac mri ,all of requested information mailed this afternoon to Dr.Nicholas Stephani Police at [facility] via [facility].

## 2015-07-16 NOTE — Telephone Encounter (Signed)
She is fine to have dental cleaning. She will be fine to fly to [place].  Lucien Budney Martinique MD, [facility]

## 2015-08-13 ENCOUNTER — Ambulatory Visit (INDEPENDENT_AMBULATORY_CARE_PROVIDER_SITE_OTHER): Payer: Medicare Other | Admitting: Cardiology

## 2015-08-13 ENCOUNTER — Encounter: Payer: Self-pay | Admitting: Cardiology

## 2015-08-13 VITALS — BP 153/54 | HR 69 | Ht 61.0 in | Wt 172.2 lb

## 2015-08-13 DIAGNOSIS — E7800 Pure hypercholesterolemia, unspecified: Secondary | ICD-10-CM

## 2015-08-13 DIAGNOSIS — R0600 Dyspnea, unspecified: Secondary | ICD-10-CM | POA: Diagnosis not present

## 2015-08-13 DIAGNOSIS — I421 Obstructive hypertrophic cardiomyopathy: Secondary | ICD-10-CM

## 2015-08-13 DIAGNOSIS — I5032 Chronic diastolic (congestive) heart failure: Secondary | ICD-10-CM | POA: Diagnosis not present

## 2015-08-13 NOTE — Progress Notes (Signed)
Melinda Gonzales Date of Birth: [birth year]   History of Present Illness: [name] is seen for follow up HOCM after recent cardiac cath.  She was evaluated in 2011 with a Myoview stress test which was normal. She had an Echo at that time that showed moderate LVH with EF 65-70%. Mild MR. She does have a history of murmur. She is not a smoker. In early 2016 she presented with increased dyspnea and an Echo and cardiac MRI were done. These with both consistent with HOCM. LVOT gradient of 112 mm Hg at rest. She was started on low dose lasix and Toprol XL with good response initially with improved dyspnea. Seen earlier this year with symptoms of increased dyspnea. No increased edema or orthopnea but more dyspnea with walking or lifting. No chest pain. No dizziness or palpitations. Energy level has decreased. Repeat Echo done and note below. She underwent cardiac cath with results noted below. We did increase her beta blocker and lasix. She thinks she is doing better. Notes her husband was just discharged after emergent surgery for incarcerated umbilical hernia.  Current Outpatient Prescriptions on File Prior to Visit  Medication Sig Dispense Refill  . furosemide (LASIX) 20 MG tablet Take 2 tablets (40 mg total) by mouth daily. 30 tablet 10  . metoprolol succinate (TOPROL-XL) 100 MG 24 hr tablet TAKE 100 MG IN AM 50 MG IN PM 60 tablet 5  . multivitamin-lutein (OCUVITE-LUTEIN) CAPS capsule Take 1 capsule by mouth daily.    . potassium chloride SA (K-DUR,KLOR-CON) 20 MEQ tablet TAKE 1 TABLET BY MOUTH DAILY 30 tablet 10  . rosuvastatin (CRESTOR) 5 MG tablet Take 5 mg by mouth daily.      . Turmeric Curcumin 500 MG CAPS Take 1,000 capsules by mouth daily.     No current facility-administered medications on file prior to visit.    No Known Allergies  Past Medical History  Diagnosis Date  . Hypercholesterolemia   . Dyspnea   . Heart murmur   . LVH (left ventricular hypertrophy)     WITH  NORMAL SYSTOLIC FUNCTION  . SOB (shortness of breath)   . Hypertrophic obstructive cardiomyopathy (HOCM) ([place])   . Chronic diastolic CHF (congestive heart failure) ([place]) 07/06/2015    Past Surgical History  Procedure Laterality Date  . Cardiac catheterization N/A 07/06/2015    Procedure: Right/Left Heart Cath and Coronary Angiography;  Surgeon: Keyuana Wank M Martinique, MD;  Location: [facility];  Service: Cardiovascular;  Laterality: N/A;    History  Smoking status  . Former Smoker  . Quit date: 05/26/1965  Smokeless tobacco  . Not on file    History  Alcohol Use No    Family History  Problem Relation Age of Onset  . Heart failure Mother 6  . Heart attack Father 58    Review of Systems: As noted in HPI.   All other systems were reviewed and are negative.  Physical Exam: BP 153/54 mmHg  Pulse 69  Ht 5\' 1"  (1.549 m)  Wt 172 lb 4 oz (78.132 kg)  BMI 32.56 kg/m2 She is an obese white female in no distress. Her HEENT exam is unremarkable. She has no JVD or bruits. Lungs are clear. Cardiac exam reveals a harsh grade 3/6 systolic murmur at the  right upper sternal border radiating to the apex. Harsh apical murmur as well. There is no S3. Abdomen is soft and nontender. She has no edema. Pedal pulses are excellent. No groin hematoma at cath  site. Neurologic exam is nonfocal. Body mass index is 32.56 kg/(m^2).   LABORATORY DATA: Echo: 03/05/14:Transthoracic Echocardiography  Patient:  Melinda, Gonzales MR #:    [id number] Study Date: 03/05/2014 Gender:   F Age:    81 Height:   157.5 cm Weight:   85.7 kg BSA:    1.98 m^2 Pt. Status: Room:  ATTENDING  Brycelyn Gambino Martinique, M.D. ORDERING   Gurkirat Basher Martin[place], M.D. REFERRING  Amilliana Hayworth Martin[place], M.D. SONOGRAPHER Victorio Palm, RDCS PERFORMING  [facility], Outpatient  cc:  ------------------------------------------------------------------- LV EF: 65% -   70%  ------------------------------------------------------------------- Indications:   SOB (R06.02). Murmur (R06.00).  ------------------------------------------------------------------- History:  PMH: Acquired from the patient and from the patient&'s chart. Dyspnea and Harsh 3/6 systolic murmur RUSB radiating to the apex. Risk factors: Former tobacco use. Hypertension. Obese.  ------------------------------------------------------------------- Study Conclusions  - Left ventricle: The cavity size was normal. There was severe concentric hypertrophy. Systolic function was vigorous. The estimated ejection fraction was in the range of 65% to 70%. Wall motion was normal; there were no regional wall motion abnormalities. Features are consistent with a pseudonormal left ventricular filling pattern, with concomitant abnormal relaxation and increased filling pressure (grade 2 diastolic dysfunction). Doppler parameters are consistent with elevated ventricular end-diastolic filling pressure. - Aortic valve: Trileaflet; mildly thickened, mildly calcified leaflets. There was mild regurgitation. - Aortic root: The aortic root was normal in size. - Mitral valve: Structurally normal valve. There was mild regurgitation. - Left atrium: The atrium was mildly dilated. - Right ventricle: The cavity size was normal. Wall thickness was normal. Systolic function was normal. - Right atrium: The atrium was normal in size. - Pulmonic valve: There was trivial regurgitation. - Pulmonary arteries: Systolic pressure was within the normal range. PA peak pressure: 36 mm Hg (S). - Inferior vena cava: The vessel was normal in size. - Pericardium, extracardiac: There was no pericardial effusion.  Impressions:  - There is severe left ventricular hypertrophy with systolic near cavity obliteration and LVOT gradient of 112 mmHg at rest. LVEF is hyperdynamic. There is systolic  anterior motion of the mitral valve leaflet. Aortic valve is poorly visualized but appears mildly thickened and calcified, transaortic gradient couldn&'t be assessed sec to significant LVOT gradient. There is mild aortic insufficiency.  A cardiac MRI should be considered for evaluation of hypertrophic cardiomyopathy.  ------------------------------------------------------------------- Labs, prior tests, procedures, and surgery: Echocardiography (October 2011).   EF was 70%.  Transthoracic echocardiography. M-mode, complete 2D, spectral Doppler, and color Doppler. Birthdate: Patient birthdate: [birth year]. Age: Patient is 81 yr old. Sex: Gender: female. BMI: 34.6 kg/m^2. Blood pressure:   120/68 Patient status: Outpatient. Study date: Study date: 03/05/2014. Study time: 09:43 AM. Location: [facility]  -------------------------------------------------------------------  ------------------------------------------------------------------- Left ventricle: The cavity size was normal. There was severe concentric hypertrophy. Systolic function was vigorous. The estimated ejection fraction was in the range of 65% to 70%. Wall motion was normal; there were no regional wall motion abnormalities. Features are consistent with a pseudonormal left ventricular filling pattern, with concomitant abnormal relaxation and increased filling pressure (grade 2 diastolic dysfunction). Doppler parameters are consistent with elevated ventricular end-diastolic filling pressure.  ------------------------------------------------------------------- Aortic valve:  Trileaflet; mildly thickened, mildly calcified leaflets. Mobility was not restricted. Doppler: Transvalvular velocity couldn&'t be assessed. There was mild regurgitation.  ------------------------------------------------------------------- Aorta: Aortic root: The aortic root was normal in  size.  ------------------------------------------------------------------- Mitral valve:  Structurally normal valve.  Mobility was not restricted. Doppler: Transvalvular velocity was within the normal range. There was no  evidence for stenosis. There was mild regurgitation.  Peak gradient (D): 5 mm Hg.  ------------------------------------------------------------------- Left atrium: The atrium was mildly dilated.  ------------------------------------------------------------------- Right ventricle: The cavity size was normal. Wall thickness was normal. Systolic function was normal.  ------------------------------------------------------------------- Pulmonic valve:  Structurally normal valve.  Cusp separation was normal. Doppler: Transvalvular velocity was within the normal range. There was no evidence for stenosis. There was trivial regurgitation.  ------------------------------------------------------------------- Tricuspid valve:  Structurally normal valve.  Doppler: Transvalvular velocity was within the normal range. There was mild regurgitation.  ------------------------------------------------------------------- Pulmonary artery:  The main pulmonary artery was normal-sized. Systolic pressure was within the normal range.  ------------------------------------------------------------------- Right atrium: The atrium was normal in size.  ------------------------------------------------------------------- Pericardium: There was no pericardial effusion.  ------------------------------------------------------------------- Systemic veins: Inferior vena cava: The vessel was normal in size.  ------------------------------------------------------------------- Measurements  Left ventricle             Value    Reference LV ID, ED, PLAX chordal    (L)   37.6 mm   43 - 52 LV ID, ES, PLAX chordal    (L)   20.7 mm   23 - 38 LV  fx shortening, PLAX chordal     45  %   >=29 LV PW thickness, ED          14.9 mm   --------- IVS/LV PW ratio, ED      (H)   1.38     <=1.3 LV e&', lateral             5.15 cm/s  --------- LV E/e&', lateral            21.94    --------- LV e&', medial             3.73 cm/s  --------- LV E/e&', medial            30.29    --------- LV e&', average             4.44 cm/s  --------- LV E/e&', average            25.45    ---------  Ventricular septum           Value    Reference IVS thickness, ED           20.5 mm   ---------  Aorta                 Value    Reference Aortic root ID, ED           27  mm   --------- Ascending aorta ID, A-P, S       33  mm   ---------  Left atrium              Value    Reference LA ID, A-P, ES             41  mm   --------- LA ID/bsa, A-P             2.08 cm/m^2 <=2.2 LA volume, S              72  ml   --------- LA volume/bsa, S            36.5 ml/m^2 --------- LA volume, ES, 1-p A4C         73  ml   --------- LA volume/bsa, ES, 1-p A4C       37  ml/m^2 --------- LA volume, ES, 1-p  A2C         69  ml   --------- LA volume/bsa, ES, 1-p A2C       34.9 ml/m^2 ---------  Mitral valve              Value    Reference Mitral E-wave peak velocity      113  cm/s  --------- Mitral A-wave peak velocity      65.6 cm/s  --------- Mitral deceleration time    (H)   303  ms   150 - 230 Mitral peak gradient, D        5   mm Hg --------- Mitral E/A ratio, peak         1.7     ---------  Pulmonary arteries           Value    Reference PA pressure, S, DP        (H)   36  mm Hg <=30  Tricuspid valve            Value    Reference Tricuspid regurg peak velocity     288  cm/s  --------- Tricuspid peak RV-RA gradient     33  mm Hg ---------  Systemic veins             Value    Reference Estimated CVP             3   mm Hg ---------  Right ventricle            Value    Reference RV pressure, S, DP       (H)   36  mm Hg <=30 RV s&', lateral, S           14.8 cm/s  ---------  Legend: (L) and (H) mark values outside specified reference range.  ------------------------------------------------------------------- Prepared and Electronically Authenticated by  Ena Dawley, M.D. 2016-02-04T17:52:12   CARDIAC MRI  TECHNIQUE: The patient was scanned on a 1.5 Tesla GE magnet. A dedicated cardiac coil was used. Functional imaging was done using Fiesta sequences. 2,3, and 4 chamber views were done to assess for RWMA's. Modified Simpson's rule using a short axis stack was used to calculate an ejection fraction on a dedicated work Conservation officer, nature. The patient received 30 cc of Multihance. After 10 minutes inversion recovery sequences were used to assess for infiltration and scar tissue.  CONTRAST: 30 cc of Multihance  FINDINGS: 1. Normal left ventricular size with moderate left ventricular hypertrophy with severe hypertrophy of the basal septum and normal systolic function (LVEF = 63%). There are no regional wall motion abnormalities.  There is systolic anterior motion of the anterior leaflet of the mitral valve associated with LVOT gradient.  The measurements are as follows:  LVEDD: 43 mm  LVESD: 29 mm  Basal anteroseptal wall: 20 mm  Basal inferolateral wall: 12 mm  Mid anteroseptal wall: 13 mm  Apical septal wall: 12 mm  Apical lateral wall 11 mm  LVEDV: 95  mm  LVESV: 35 ml  SV: 60 ml  CO: 3.8 L/minute  Myocardial mass: 162 g  2. Normal right ventricular size, thickness and systolic function (RVEF = 56%). There are no regional wall motion abnormalities.  RVEDV: 88 ml  RVESV: 39 ml  3. Moderate mitral regurgitation with centrally directed jet.  Trivial aortic insufficiency.  4. Mildly dilated left atrium.  5. Normal caliber of the aortic root, aorta and pulmonary artery.  6. There is  subendocardial late gadolinium enhancement in the basal inferior and inferolateral walls.  IMPRESSION: 1. Normal left ventricular size with small LV cavity. There is moderate left ventricular hypertrophy with severe hypertrophy of the basal septum and normal systolic function (LVEF = 63%). There are no regional wall motion abnormalities.  There is systolic anterior motion of the anterior leaflet of the mitral valve associated with LVOT gradient.  Conclusively, these findings are consistent with hypertrophic cardiomyopathy with no late gadolinium enhancement found the in the anteroseptal wall.  2. Normal right ventricular size, thickness and systolic function (RVEF = 56%). There are no regional wall motion abnormalities.  3. Moderate mitral regurgitation with centrally directed jet. Trivial aortic insufficiency.  4. Mildly dilated left atrium.  5. There is subendocardial late gadolinium enhancement in the basal inferior and inferolateral walls suggestive of prior subendocardial infarct.  Ena Dawley   Electronically Signed  By: Ena Dawley  On: 03/26/2014 21:02  24 hr. Holter monitor 03/05/14: Occasional PAC,  PVC. One 5 beat run of PAT. One 9 beat run of NSVT.   Repeat Echo 06/15/15: Study Conclusions  - Left ventricle: The cavity size was normal. There was severe  asymmetric hypertrophy of the septum. Systolic function was  vigorous. The estimated ejection fraction was in the range  of 65%  to 70%. Wall motion was normal; there were no regional wall  motion abnormalities. Features are consistent with a pseudonormal  left ventricular filling pattern, with concomitant abnormal  relaxation and increased filling pressure (grade 2 diastolic  dysfunction). Doppler parameters are consistent with high  ventricular filling pressure. - Aortic valve: Valve mobility was restricted. There was mild  regurgitation. - Mitral valve: There was severe systolic anterior motion. There  was moderate to severe regurgitation. - Left atrium: The atrium was mildly dilated. - Pulmonary arteries: Systolic pressure was moderately increased.  PA peak pressure: 51 mm Hg (S).  Impressions:  - Vigorous LV systolic function; grade 2 diastolic dysfunction with  elevated LV filling pressure; severe asymmetric LVH; mild AI; MV  SAM with peak LVOT gradient of 6 m/s; moderate to severe MR; mild  LAE; mild TR with moderately elevated pulmonary pressure;  findings c/w HOCM.  ------------------------------------------------------------------- Labs, prior tests, procedures, and surgery: Transthoracic echocardiography (03/05/2014). EF was 70%. LVOT gradiant:112 mmHg.  Transthoracic echocardiography. M-mode, complete 2D, spectral Doppler, and color Doppler. Birthdate: Patient birthdate: [birth year]. Age: Patient is 81 yr old. Sex: Gender: female. BMI: 31.9 kg/m^2. Blood pressure: 138/76 Patient status: Outpatient. Study date: Study date: 06/15/2015. Study time: 11:32 AM. Location: [facility]  -------------------------------------------------------------------  ------------------------------------------------------------------- Left ventricle: The cavity size was normal. There was severe asymmetric hypertrophy of the septum. Systolic function was vigorous. The estimated ejection fraction was in the range of 65% to 70%. Wall motion was normal; there were  no regional wall motion abnormalities. Features are consistent with a pseudonormal left ventricular filling pattern, with concomitant abnormal relaxation and increased filling pressure (grade 2 diastolic dysfunction). Doppler parameters are consistent with high ventricular filling pressure.  ------------------------------------------------------------------- Aortic valve: Trileaflet; mildly calcified leaflets. Valve mobility was restricted. Doppler: Transvalvular velocity was within the normal range. There was no stenosis. There was mild regurgitation.  ------------------------------------------------------------------- Aorta: Aortic root: The aortic root was normal in size.  ------------------------------------------------------------------- Mitral valve: Mildly thickened leaflets . Mobility was not restricted. There was severe systolic anterior motion. Doppler: Transvalvular velocity was within the normal range. There was no evidence for stenosis. There was moderate to severe regurgitation.  Peak gradient (D):  5 mm Hg.  ------------------------------------------------------------------- Left atrium: The atrium was mildly dilated.  ------------------------------------------------------------------- Right ventricle: The cavity size was normal. Systolic function was normal.  ------------------------------------------------------------------- Pulmonic valve: Doppler: Transvalvular velocity was within the normal range. There was no evidence for stenosis. There was mild regurgitation.  ------------------------------------------------------------------- Tricuspid valve: Structurally normal valve. Doppler: Transvalvular velocity was within the normal range. There was mild regurgitation.  ------------------------------------------------------------------- Pulmonary artery: Systolic pressure was moderately  increased.  ------------------------------------------------------------------- Right atrium: The atrium was normal in size.  ------------------------------------------------------------------- Pericardium: There was no pericardial effusion.  ------------------------------------------------------------------- Systemic veins: Inferior vena cava: The vessel was normal in size.  ------------------------------------------------------------------- Measurements  Left ventricle Value Reference LV ID, ED, PLAX chordal (L) 39.6 mm 43 - 52 LV ID, ES, PLAX chordal 28.3 mm 23 - 38 LV fx shortening, PLAX chordal 29 % >=29 LV PW thickness, ED 12 mm --------- IVS/LV PW ratio, ED (H) 1.51 <=1.3 LV e&', lateral 8.78 cm/s --------- LV E/e&', lateral 13.1 --------- LV e&', medial 3.81 cm/s --------- LV E/e&', medial 30.18 --------- LV e&', average 6.3 cm/s --------- LV E/e&', average 18.27 ---------  Ventricular septum Value Reference IVS thickness, ED 18.1 mm ---------  LVOT Value Reference LVOT ID, S 15 mm --------- LVOT area 1.77 cm^2 ---------  Aortic valve Value Reference Aortic regurg pressure half-time 495 ms ---------  Aorta Value Reference Aortic root ID, ED 29 mm --------- Ascending aorta ID, A-P, S 32 mm  ---------  Left atrium Value Reference LA ID, A-P, ES 41 mm --------- LA ID/bsa, A-P 2.17 cm/m^2 <=2.2 LA volume, S 65.1 ml --------- LA volume/bsa, S 34.4 ml/m^2 --------- LA volume, ES, 1-p A4C 63.6 ml --------- LA volume/bsa, ES, 1-p A4C 33.6 ml/m^2 --------- LA volume, ES, 1-p A2C 64.4 ml --------- LA volume/bsa, ES, 1-p A2C 34.1 ml/m^2 ---------  Mitral valve Value Reference Mitral E-wave peak velocity 115 cm/s --------- Mitral A-wave peak velocity 67.4 cm/s --------- Mitral deceleration time (H) 320 ms 150 - 230 Mitral peak gradient, D 5 mm Hg --------- Mitral E/A ratio, peak 1.7 --------- Mitral regurg VTI, PISA 242 cm --------- Mitral ERO, PISA 0.16 cm^2 --------- Mitral regurg volume, PISA 39 ml ---------  Pulmonary arteries Value Reference PA pressure, S, DP (H) 51 mm Hg <=30  Tricuspid valve Value Reference Tricuspid regurg peak velocity 326 cm/s --------- Tricuspid peak RV-RA gradient 43 mm Hg ---------  Systemic veins Value Reference Estimated CVP 8 mm Hg ---------  Right ventricle Value Reference RV pressure, S, DP (H) 51 mm Hg <=30 RV s&', lateral, S 12 cm/s ---------  Pulmonic valve Value Reference Pulmonic  regurg velocity, ED 126 cm/s --------- Pulmonic regurg gradient, ED 6 mm Hg ---------  Legend: (L) and (H) mark values outside specified reference range.  ------------------------------------------------------------------- Prepared and Electronically Authenticated by  Kirk Ruths 2017-05-16T15:00:39  Procedures    Right/Left Heart Cath and Coronary Angiography    Conclusion     Ost LAD to Prox LAD lesion, 25% stenosed.  Ost 1st Diag to 1st Diag lesion, 40% stenosed.  Prox RCA to Mid RCA lesion, 20% stenosed.  1. Nonobstructive CAD 2. Moderate to severe pulmonary HTN with elevated LV filling pressures. 3. Good cardiac output.  4. Mild LV gradient by pullback. Significant post PVC increase in gradient consistent with dynamic outflow obstruction.   Plan: consider surgical septal myectomy      Indications    Hypertrophic obstructive cardiomyopathy (HCC) [I42.1 (ICD-10-CM)]    Technique and Indications    Indication: 81 yo WF  with hypertrophic cardiomyopathy and progressive CHF  Procedural Details: The right groin was prepped, draped, and anesthetized with 1% lidocaine. Using the modified Seldinger technique a 5 Fr sheath was placed in the right femoral artery and a 7 French sheath was placed in the right femoral vein. A Swan-Ganz catheter was used for the right heart catheterization. Standard protocol was followed for recording of right heart pressures and sampling of oxygen saturations. Fick cardiac output was calculated. Standard Judkins catheters were used for selective coronary angiography and left ventriculography. There were no immediate procedural complications. The patient was transferred to the post catheterization recovery area for further monitoring.  Contrast: 50 cc  During this procedure the patient is administered a total of Versed 1 mg and Fentanyl 25 mg to achieve and maintain moderate conscious sedation. The  patient's heart rate, blood pressure, and oxygen saturation are monitored continuously during the procedure. The period of conscious sedation is 37 minutes, of which I was present face-to-face 100% of this time. Estimated blood loss <50 mL. There were no immediate complications during the procedure.    Coronary Findings    Dominance: Right   Left Main  Vessel was injected. Vessel is normal in caliber. Vessel is angiographically normal.     Left Anterior Descending   . Ost LAD to Prox LAD lesion, 25% stenosed. Diffuse.   . First Diagonal Branch   . Ost 1st Diag to 1st Diag lesion, 40% stenosed.     Left Circumflex  Vessel was injected. Vessel is large. Vessel is angiographically normal.   . Third Obtuse Marginal Branch   The vessel is large in size.     Right Coronary Artery   . Prox RCA to Mid RCA lesion, 20% stenosed.       Right Heart Pressures Hemodynamic findings consistent with moderate pulmonary hypertension. Elevated LV EDP consistent with volume overload.    Coronary Diagrams    Diagnostic Diagram            Implants     No implant documentation for this case.    PACS Images    Show images for Cardiac catheterization     Link to Procedure Log    Procedure Log      Hemo Data       Most Recent Value   Fick Cardiac Output  3.81 L/min   Fick Cardiac Output Index  2.12 (L/min)/BSA   RA A Wave  10 mmHg   RA V Wave  5 mmHg   RA Mean  5 mmHg   RV Systolic Pressure  70 mmHg   RV Diastolic Pressure  3 mmHg   RV EDP  10 mmHg   PA Systolic Pressure  64 mmHg   PA Diastolic Pressure  24 mmHg   PA Mean  40 mmHg   PW A Wave  23 mmHg   PW V Wave  30 mmHg   PW Mean  24 mmHg   AO Systolic Pressure  0000000 mmHg   AO Diastolic Pressure  73 mmHg   AO Mean  123XX123 mmHg   LV Systolic Pressure  123XX123 mmHg   LV Diastolic Pressure  12 mmHg   LV EDP  20 mmHg   Arterial Occlusion Pressure Extended Systolic Pressure  [id number] mmHg   Arterial  Occlusion Pressure Extended Diastolic Pressure  70 mmHg   Arterial Occlusion Pressure Extended Mean Pressure  110 mmHg   Left Ventricular Apex Extended Systolic Pressure  0000000 mmHg   Left Ventricular  Apex Extended Diastolic Pressure  12 mmHg   Left Ventricular Apex Extended EDP Pressure  19 mmHg   QP/QS  1   TPVR Index  18.91 HRUI   TSVR Index  51.08 HRUI   PVR SVR Ratio  0.16   TPVR/TSVR Ratio  0.37    Order-Level Documents:    There are no order-level documents.    Encounter-Level Documents - 07/06/2015:      Scan on 07/08/2015 10:24 AM by Provider Default, MDScan on 07/08/2015 10:24 AM by Provider Default, MD     Scan on 07/08/2015 10:21 AM by Provider Default, MDScan on 07/08/2015 10:21 AM by Provider Default, MD     Scan on 07/08/2015 10:19 AM by Provider Default, MDScan on 07/08/2015 10:19 AM by Provider Default, MD     Scan on 07/06/2015 8:16 AM by Provider Default, MDScan on 07/06/2015 8:16 AM by Provider Default, MD     Electronic signature on 07/06/2015 5:30 AM    Signed    Electronically signed by Dinora Hemm M Martin[place], MD on 07/06/15 at 0826 EDT     Lab Results  Component Value Date   WBC 6.5 07/01/2015   HGB 12.5 07/01/2015   HCT 37.6 07/01/2015   PLT 282 07/01/2015   GLUCOSE 112* 07/01/2015   CHOL 155 05/19/2015   TRIG 151* 05/19/2015   HDL 37* 05/19/2015   LDLCALC 88 05/19/2015   ALT 5* 05/19/2015   AST 13 05/19/2015   NA 141 07/01/2015   K 4.1 07/01/2015   CL 109 07/01/2015   CREATININE 1.13* 07/01/2015   BUN 19 07/01/2015   CO2 21 07/01/2015   INR 1.03 07/06/2015     Assessment / Plan: 1. HOCM. Classic morphologic features by Echo and MRI. MRI also shows some subendocardial scar consistent with HCM.  Initially improved on increased doses of Toprol and lasix now class 2-3.  Echo shows increased LVOT gradient on medication and more SAM and MR. She has evidence of pulmonary HTN- confirmed on cardiac cath with elevated LV filling pressures. We have made  referral for septal myectomy at [facility] clinic with Dr. Magdalene Molly.  Although she is almost 80 she is still in good overall health and I think would benefit from surgery.   2. Murmur. Due to LVOT gradient and mod- severe  MR.   3. Palpitations. NSVT noted on monitor. Symptoms resolved on beta blocker.  If she develops syncope or presyncope she will need to see EP.  4. Obesity.  5. Hypercholesterolemia.   I will follow up in 3 months.

## 2015-08-13 NOTE — Patient Instructions (Signed)
Continue your current therapy   I will see you in 3 months. 

## 2015-08-16 ENCOUNTER — Telehealth: Payer: Self-pay | Admitting: Cardiology

## 2015-08-16 NOTE — Telephone Encounter (Signed)
Did not need the encounter °

## 2015-09-06 ENCOUNTER — Telehealth: Payer: Self-pay | Admitting: Cardiology

## 2015-09-06 NOTE — Telephone Encounter (Signed)
Returned call to patient.She stated she was away last week visiting her daughter in [place].Stated she is feeling so much better she don't know if she needs to see Dr.Smedira at [facility].Stated while away she received a phone call from [place] office.Stated she will call me back after she talks to them.

## 2015-09-06 NOTE — Telephone Encounter (Signed)
New message      Returning a call to the nurse from last week

## 2015-09-21 ENCOUNTER — Encounter: Payer: Self-pay | Admitting: Cardiology

## 2015-09-22 ENCOUNTER — Ambulatory Visit (INDEPENDENT_AMBULATORY_CARE_PROVIDER_SITE_OTHER): Payer: Medicare Other | Admitting: Cardiology

## 2015-09-22 ENCOUNTER — Encounter: Payer: Self-pay | Admitting: Cardiology

## 2015-09-22 VITALS — BP 120/64 | HR 66 | Ht 62.0 in | Wt 173.4 lb

## 2015-09-22 DIAGNOSIS — I421 Obstructive hypertrophic cardiomyopathy: Secondary | ICD-10-CM | POA: Diagnosis not present

## 2015-09-22 DIAGNOSIS — I472 Ventricular tachycardia: Secondary | ICD-10-CM

## 2015-09-22 DIAGNOSIS — I5032 Chronic diastolic (congestive) heart failure: Secondary | ICD-10-CM | POA: Diagnosis not present

## 2015-09-22 DIAGNOSIS — I4729 Other ventricular tachycardia: Secondary | ICD-10-CM

## 2015-09-22 NOTE — Patient Instructions (Signed)
Continue your current therapy  Good luck in [place]!

## 2015-09-22 NOTE — Progress Notes (Signed)
Melinda Gonzales Date of Birth: [birth year]   History of Present Illness: [name] (81) is seen for follow up HOCM. She was evaluated in 2011 with a Myoview stress test which was normal. She had an Echo at that time that showed moderate LVH with EF 65-70%. Mild MR. She does have a history of murmur. She is not a smoker. In early 2016 she presented with increased dyspnea and an Echo and cardiac MRI were done. These with both consistent with HOCM. LVOT gradient of 112 mm Hg at rest. She was started on low dose lasix and Toprol XL with good response initially with improved dyspnea. Seen earlier this year with symptoms of increased dyspnea. No increased edema or orthopnea but more dyspnea with walking or lifting. No chest pain. No dizziness or palpitations. Energy level has decreased. Repeat Echo done and note below. She underwent cardiac cath with results noted below. We did increase her beta blocker and lasix. She thinks she is doing OK. Still has some DOE. She is scheduled for surgical evaluation at the [facility] clinic in early October.  Current Outpatient Prescriptions on File Prior to Visit  Medication Sig Dispense Refill  . furosemide (LASIX) 20 MG tablet Take 2 tablets (40 mg total) by mouth daily. 30 tablet 10  . multivitamin-lutein (OCUVITE-LUTEIN) CAPS capsule Take 1 capsule by mouth daily.    . potassium chloride SA (K-DUR,KLOR-CON) 20 MEQ tablet TAKE 1 TABLET BY MOUTH DAILY 30 tablet 10  . rosuvastatin (CRESTOR) 5 MG tablet Take 5 mg by mouth daily.      . Turmeric Curcumin 500 MG CAPS Take 1,000 capsules by mouth daily.     No current facility-administered medications on file prior to visit.     No Known Allergies  Past Medical History:  Diagnosis Date  . Chronic diastolic CHF (congestive heart failure) ([place]) 07/06/2015  . Dyspnea   . Heart murmur   . Hypercholesterolemia   . Hypertrophic obstructive cardiomyopathy (HOCM) ([place])   . LVH (left ventricular hypertrophy)    WITH  NORMAL SYSTOLIC FUNCTION  . SOB (shortness of breath)     Past Surgical History:  Procedure Laterality Date  . CARDIAC CATHETERIZATION N/A 07/06/2015   Procedure: Right/Left Heart Cath and Coronary Angiography;  Surgeon: Kimley Apsey M Martinique, MD;  Location: [place] [facility];  Service: Cardiovascular;  Laterality: N/A;    History  Smoking Status  . Former Smoker  . Quit date: 05/26/1965  Smokeless Tobacco  . Never Used    History  Alcohol Use No    Family History  Problem Relation Age of Onset  . Heart failure Mother 7  . Heart attack Father 21    Review of Systems: As noted in HPI.   All other systems were reviewed and are negative.  Physical Exam: BP 120/64   Pulse 66   Ht 5\' 2"  (1.575 m)   Wt 173 lb 6.4 oz (78.7 kg)   BMI 31.72 kg/m  She is an obese white female in no distress. Her HEENT exam is unremarkable. She has no JVD or bruits. Lungs are clear. Cardiac exam reveals a harsh grade 3/6 systolic murmur at the  right upper sternal border radiating to the apex. Harsh apical murmur as well. There is no S3. Abdomen is soft and nontender. She has no edema. Pedal pulses are excellent. No groin hematoma at cath site. Neurologic exam is nonfocal. Body mass index is 31.72 kg/m.   LABORATORY DATA: Echo: 03/05/14:Transthoracic Echocardiography  Patient:  Melinda Gonzales, Melinda Gonzales MR #:    [id number] Study Date: 03/05/2014 Gender:   F Age:    81 Height:   157.5 cm Weight:   85.7 kg BSA:    1.98 m^2 Pt. Status: Room:  ATTENDING  Jesslyn Viglione Martinique, M.D. ORDERING   Keino Placencia [place], M.D. REFERRING  Adanya Sosinski [place], M.D. SONOGRAPHER Victorio Palm, RDCS PERFORMING  [facility], Outpatient  cc:  ------------------------------------------------------------------- LV EF: 65% -  70%  ------------------------------------------------------------------- Indications:   SOB (R06.02). Murmur  (R06.00).  ------------------------------------------------------------------- History:  PMH: Acquired from the patient and from the patient&'s chart. Dyspnea and Harsh 3/6 systolic murmur RUSB radiating to the apex. Risk factors: Former tobacco use. Hypertension. Obese.  ------------------------------------------------------------------- Study Conclusions  - Left ventricle: The cavity size was normal. There was severe concentric hypertrophy. Systolic function was vigorous. The estimated ejection fraction was in the range of 65% to 70%. Wall motion was normal; there were no regional wall motion abnormalities. Features are consistent with a pseudonormal left ventricular filling pattern, with concomitant abnormal relaxation and increased filling pressure (grade 2 diastolic dysfunction). Doppler parameters are consistent with elevated ventricular end-diastolic filling pressure. - Aortic valve: Trileaflet; mildly thickened, mildly calcified leaflets. There was mild regurgitation. - Aortic root: The aortic root was normal in size. - Mitral valve: Structurally normal valve. There was mild regurgitation. - Left atrium: The atrium was mildly dilated. - Right ventricle: The cavity size was normal. Wall thickness was normal. Systolic function was normal. - Right atrium: The atrium was normal in size. - Pulmonic valve: There was trivial regurgitation. - Pulmonary arteries: Systolic pressure was within the normal range. PA peak pressure: 36 mm Hg (S). - Inferior vena cava: The vessel was normal in size. - Pericardium, extracardiac: There was no pericardial effusion.  Impressions:  - There is severe left ventricular hypertrophy with systolic near cavity obliteration and LVOT gradient of 112 mmHg at rest. LVEF is hyperdynamic. There is systolic anterior motion of the mitral valve leaflet. Aortic valve is poorly visualized but appears mildly  thickened and calcified, transaortic gradient couldn&'t be assessed sec to significant LVOT gradient. There is mild aortic insufficiency.  A cardiac MRI should be considered for evaluation of hypertrophic cardiomyopathy.  ------------------------------------------------------------------- Labs, prior tests, procedures, and surgery: Echocardiography (October 2011).   EF was 70%.  Transthoracic echocardiography. M-mode, complete 2D, spectral Doppler, and color Doppler. Birthdate: Patient birthdate: 1934-03-17. Age: Patient is 81 yr old. Sex: Gender: female. BMI: 34.6 kg/m^2. Blood pressure:   120/68 Patient status: Outpatient. Study date: Study date: 03/05/2014. Study time: 09:43 AM. Location: [facility]  -------------------------------------------------------------------  ------------------------------------------------------------------- Left ventricle: The cavity size was normal. There was severe concentric hypertrophy. Systolic function was vigorous. The estimated ejection fraction was in the range of 65% to 70%. Wall motion was normal; there were no regional wall motion abnormalities. Features are consistent with a pseudonormal left ventricular filling pattern, with concomitant abnormal relaxation and increased filling pressure (grade 2 diastolic dysfunction). Doppler parameters are consistent with elevated ventricular end-diastolic filling pressure.  ------------------------------------------------------------------- Aortic valve:  Trileaflet; mildly thickened, mildly calcified leaflets. Mobility was not restricted. Doppler: Transvalvular velocity couldn&'t be assessed. There was mild regurgitation.  ------------------------------------------------------------------- Aorta: Aortic root: The aortic root was normal in size.  ------------------------------------------------------------------- Mitral valve:  Structurally normal valve.   Mobility was not restricted. Doppler: Transvalvular velocity was within the normal range. There was no evidence for stenosis. There was mild regurgitation.  Peak gradient (D): 5 mm Hg.  ------------------------------------------------------------------- Left atrium: The atrium was  mildly dilated.  ------------------------------------------------------------------- Right ventricle: The cavity size was normal. Wall thickness was normal. Systolic function was normal.  ------------------------------------------------------------------- Pulmonic valve:  Structurally normal valve.  Cusp separation was normal. Doppler: Transvalvular velocity was within the normal range. There was no evidence for stenosis. There was trivial regurgitation.  ------------------------------------------------------------------- Tricuspid valve:  Structurally normal valve.  Doppler: Transvalvular velocity was within the normal range. There was mild regurgitation.  ------------------------------------------------------------------- Pulmonary artery:  The main pulmonary artery was normal-sized. Systolic pressure was within the normal range.  ------------------------------------------------------------------- Right atrium: The atrium was normal in size.  ------------------------------------------------------------------- Pericardium: There was no pericardial effusion.  ------------------------------------------------------------------- Systemic veins: Inferior vena cava: The vessel was normal in size.  ------------------------------------------------------------------- Measurements  Left ventricle             Value    Reference LV ID, ED, PLAX chordal    (L)   37.6 mm   43 - 52 LV ID, ES, PLAX chordal    (L)   20.7 mm   23 - 38 LV fx shortening, PLAX chordal     45  %   >=29 LV PW thickness, ED          14.9 mm    --------- IVS/LV PW ratio, ED      (H)   1.38     <=1.3 LV e&', lateral             5.15 cm/s  --------- LV E/e&', lateral            21.94    --------- LV e&', medial             3.73 cm/s  --------- LV E/e&', medial            30.29    --------- LV e&', average             4.44 cm/s  --------- LV E/e&', average            25.45    ---------  Ventricular septum           Value    Reference IVS thickness, ED           20.5 mm   ---------  Aorta                 Value    Reference Aortic root ID, ED           27  mm   --------- Ascending aorta ID, A-P, S       33  mm   ---------  Left atrium              Value    Reference LA ID, A-P, ES             41  mm   --------- LA ID/bsa, A-P             2.08 cm/m^2 <=2.2 LA volume, S              72  ml   --------- LA volume/bsa, S            36.5 ml/m^2 --------- LA volume, ES, 1-p A4C         73  ml   --------- LA volume/bsa, ES, 1-p A4C       37  ml/m^2 --------- LA volume, ES, 1-p A2C         69  ml   --------- LA volume/bsa, ES, 1-p A2C  34.9 ml/m^2 ---------  Mitral valve              Value    Reference Mitral E-wave peak velocity      113  cm/s  --------- Mitral A-wave peak velocity      65.6 cm/s  --------- Mitral deceleration time    (H)   303  ms   150 - 230 Mitral peak gradient, D        5   mm Hg --------- Mitral E/A ratio, peak         1.7     ---------  Pulmonary arteries           Value    Reference PA pressure, S, DP       (H)   36  mm Hg <=30  Tricuspid valve            Value     Reference Tricuspid regurg peak velocity     288  cm/s  --------- Tricuspid peak RV-RA gradient     33  mm Hg ---------  Systemic veins             Value    Reference Estimated CVP             3   mm Hg ---------  Right ventricle            Value    Reference RV pressure, S, DP       (H)   36  mm Hg <=30 RV s&', lateral, S           14.8 cm/s  ---------  Legend: (L) and (H) mark values outside specified reference range.  ------------------------------------------------------------------- Prepared and Electronically Authenticated by  Ena Dawley, M.D. 2016-02-04T17:52:12   CARDIAC MRI  TECHNIQUE: The patient was scanned on a 1.5 Tesla GE magnet. A dedicated cardiac coil was used. Functional imaging was done using Fiesta sequences. 2,3, and 4 chamber views were done to assess for RWMA's. Modified Simpson's rule using a short axis stack was used to calculate an ejection fraction on a dedicated work Conservation officer, nature. The patient received 30 cc of Multihance. After 10 minutes inversion recovery sequences were used to assess for infiltration and scar tissue.  CONTRAST: 30 cc of Multihance  FINDINGS: 1. Normal left ventricular size with moderate left ventricular hypertrophy with severe hypertrophy of the basal septum and normal systolic function (LVEF = 63%). There are no regional wall motion abnormalities.  There is systolic anterior motion of the anterior leaflet of the mitral valve associated with LVOT gradient.  The measurements are as follows:  LVEDD: 43 mm  LVESD: 29 mm  Basal anteroseptal wall: 20 mm  Basal inferolateral wall: 12 mm  Mid anteroseptal wall: 13 mm  Apical septal wall: 12 mm  Apical lateral wall 11 mm  LVEDV: 95 mm  LVESV: 35 ml  SV: 60 ml  CO: 3.8 L/minute  Myocardial mass: 162 g  2.  Normal right ventricular size, thickness and systolic function (RVEF = 56%). There are no regional wall motion abnormalities.  RVEDV: 88 ml  RVESV: 39 ml  3. Moderate mitral regurgitation with centrally directed jet.  Trivial aortic insufficiency.  4. Mildly dilated left atrium.  5. Normal caliber of the aortic root, aorta and pulmonary artery.  6. There is subendocardial late gadolinium enhancement in the basal inferior and inferolateral walls.  IMPRESSION: 1. Normal left ventricular size with small LV cavity. There is moderate left  ventricular hypertrophy with severe hypertrophy of the basal septum and normal systolic function (LVEF = 63%). There are no regional wall motion abnormalities.  There is systolic anterior motion of the anterior leaflet of the mitral valve associated with LVOT gradient.  Conclusively, these findings are consistent with hypertrophic cardiomyopathy with no late gadolinium enhancement found the in the anteroseptal wall.  2. Normal right ventricular size, thickness and systolic function (RVEF = 56%). There are no regional wall motion abnormalities.  3. Moderate mitral regurgitation with centrally directed jet. Trivial aortic insufficiency.  4. Mildly dilated left atrium.  5. There is subendocardial late gadolinium enhancement in the basal inferior and inferolateral walls suggestive of prior subendocardial infarct.  Ena Dawley   Electronically Signed  By: Ena Dawley  On: 03/26/2014 21:02  24 hr. Holter monitor 03/05/14: Occasional PAC,  PVC. One 5 beat run of PAT. One 9 beat run of NSVT.   Repeat Echo 06/15/15: Study Conclusions  - Left ventricle: The cavity size was normal. There was severe  asymmetric hypertrophy of the septum. Systolic function was  vigorous. The estimated ejection fraction was in the range of 65%  to 70%. Wall motion was normal; there were no regional wall  motion  abnormalities. Features are consistent with a pseudonormal  left ventricular filling pattern, with concomitant abnormal  relaxation and increased filling pressure (grade 2 diastolic  dysfunction). Doppler parameters are consistent with high  ventricular filling pressure. - Aortic valve: Valve mobility was restricted. There was mild  regurgitation. - Mitral valve: There was severe systolic anterior motion. There  was moderate to severe regurgitation. - Left atrium: The atrium was mildly dilated. - Pulmonary arteries: Systolic pressure was moderately increased.  PA peak pressure: 51 mm Hg (S).  Impressions:  - Vigorous LV systolic function; grade 2 diastolic dysfunction with  elevated LV filling pressure; severe asymmetric LVH; mild AI; MV  SAM with peak LVOT gradient of 6 m/s; moderate to severe MR; mild  LAE; mild TR with moderately elevated pulmonary pressure;  findings c/w HOCM.  ------------------------------------------------------------------- Labs, prior tests, procedures, and surgery: Transthoracic echocardiography (03/05/2014). EF was 70%. LVOT gradiant:112 mmHg.  Transthoracic echocardiography. M-mode, complete 2D, spectral Doppler, and color Doppler. Birthdate: Patient birthdate: 12-31-1934. Age: Patient is 81 yr old. Sex: Gender: female. BMI: 31.9 kg/m^2. Blood pressure: 138/76 Patient status: Outpatient. Study date: Study date: 06/15/2015. Study time: 11:32 AM. Location: [place] [facility]  -------------------------------------------------------------------  ------------------------------------------------------------------- Left ventricle: The cavity size was normal. There was severe asymmetric hypertrophy of the septum. Systolic function was vigorous. The estimated ejection fraction was in the range of 65% to 70%. Wall motion was normal; there were no regional wall motion abnormalities. Features are consistent with a  pseudonormal left ventricular filling pattern, with concomitant abnormal relaxation and increased filling pressure (grade 2 diastolic dysfunction). Doppler parameters are consistent with high ventricular filling pressure.  ------------------------------------------------------------------- Aortic valve: Trileaflet; mildly calcified leaflets. Valve mobility was restricted. Doppler: Transvalvular velocity was within the normal range. There was no stenosis. There was mild regurgitation.  ------------------------------------------------------------------- Aorta: Aortic root: The aortic root was normal in size.  ------------------------------------------------------------------- Mitral valve: Mildly thickened leaflets . Mobility was not restricted. There was severe systolic anterior motion. Doppler: Transvalvular velocity was within the normal range. There was no evidence for stenosis. There was moderate to severe regurgitation.  Peak gradient (D): 5 mm Hg.  ------------------------------------------------------------------- Left atrium: The atrium was mildly dilated.  ------------------------------------------------------------------- Right ventricle: The cavity size was normal. Systolic function was normal.  -------------------------------------------------------------------  Pulmonic valve: Doppler: Transvalvular velocity was within the normal range. There was no evidence for stenosis. There was mild regurgitation.  ------------------------------------------------------------------- Tricuspid valve: Structurally normal valve. Doppler: Transvalvular velocity was within the normal range. There was mild regurgitation.  ------------------------------------------------------------------- Pulmonary artery: Systolic pressure was moderately increased.  ------------------------------------------------------------------- Right atrium: The atrium was  normal in size.  ------------------------------------------------------------------- Pericardium: There was no pericardial effusion.  ------------------------------------------------------------------- Systemic veins: Inferior vena cava: The vessel was normal in size.  ------------------------------------------------------------------- Measurements  Left ventricle Value Reference LV ID, ED, PLAX chordal (L) 39.6 mm 43 - 52 LV ID, ES, PLAX chordal 28.3 mm 23 - 38 LV fx shortening, PLAX chordal 29 % >=29 LV PW thickness, ED 12 mm --------- IVS/LV PW ratio, ED (H) 1.51 <=1.3 LV e&', lateral 8.78 cm/s --------- LV E/e&', lateral 13.1 --------- LV e&', medial 3.81 cm/s --------- LV E/e&', medial 30.18 --------- LV e&', average 6.3 cm/s --------- LV E/e&', average 18.27 ---------  Ventricular septum Value Reference IVS thickness, ED 18.1 mm ---------  LVOT Value Reference LVOT ID, S 15 mm --------- LVOT area 1.77 cm^2 ---------  Aortic valve Value Reference Aortic regurg pressure half-time 495 ms ---------  Aorta Value Reference Aortic root ID, ED 29 mm --------- Ascending aorta ID, A-P, S 32 mm ---------  Left atrium Value Reference LA ID, A-P, ES 41  mm --------- LA ID/bsa, A-P 2.17 cm/m^2 <=2.2 LA volume, S 65.1 ml --------- LA volume/bsa, S 34.4 ml/m^2 --------- LA volume, ES, 1-p A4C 63.6 ml --------- LA volume/bsa, ES, 1-p A4C 33.6 ml/m^2 --------- LA volume, ES, 1-p A2C 64.4 ml --------- LA volume/bsa, ES, 1-p A2C 34.1 ml/m^2 ---------  Mitral valve Value Reference Mitral E-wave peak velocity 115 cm/s --------- Mitral A-wave peak velocity 67.4 cm/s --------- Mitral deceleration time (H) 320 ms 150 - 230 Mitral peak gradient, D 5 mm Hg --------- Mitral E/A ratio, peak 1.7 --------- Mitral regurg VTI, PISA 242 cm --------- Mitral ERO, PISA 0.16 cm^2 --------- Mitral regurg volume, PISA 39 ml ---------  Pulmonary arteries Value Reference PA pressure, S, DP (H) 51 mm Hg <=30  Tricuspid valve Value Reference Tricuspid regurg peak velocity 326 cm/s --------- Tricuspid peak RV-RA gradient 43 mm Hg ---------  Systemic veins Value Reference Estimated CVP 8 mm Hg ---------  Right ventricle Value Reference RV pressure, S, DP (H) 51 mm Hg <=30 RV s&', lateral, S 12 cm/s ---------  Pulmonic valve Value Reference Pulmonic regurg velocity, ED 126 cm/s --------- Pulmonic regurg gradient, ED 6 mm Hg  ---------  Legend: (L) and (H) mark values outside specified reference range.  ------------------------------------------------------------------- Prepared and Electronically Authenticated by  Kirk Ruths 2017-05-16T15:00:39  Procedures    Right/Left Heart Cath and Coronary Angiography    Conclusion     Ost LAD to Prox LAD lesion, 25% stenosed.  Ost 1st Diag to 1st Diag lesion, 40% stenosed.  Prox RCA to Mid RCA lesion, 20% stenosed.  1. Nonobstructive CAD 2. Moderate to severe pulmonary HTN with elevated LV filling pressures. 3. Good cardiac output.  4. Mild LV gradient by pullback. Significant post PVC increase in gradient consistent with dynamic outflow obstruction.   Plan: consider surgical septal myectomy      Indications    Hypertrophic obstructive cardiomyopathy ([place]) [I42.1 (ICD-10-CM)]    Technique and Indications    Indication: 81 yo WF with hypertrophic cardiomyopathy and progressive CHF  Procedural Details: The right groin was prepped, draped, and anesthetized with 1% lidocaine. Using the modified Seldinger technique a 5  Fr sheath was placed in the right femoral artery and a 7 French sheath was placed in the right femoral vein. A Swan-Ganz catheter was used for the right heart catheterization. Standard protocol was followed for recording of right heart pressures and sampling of oxygen saturations. Fick cardiac output was calculated. Standard Judkins catheters were used for selective coronary angiography and left ventriculography. There were no immediate procedural complications. The patient was transferred to the post catheterization recovery area for further monitoring.  Contrast: 50 cc  During this procedure the patient is administered a total of Versed 1 mg and Fentanyl 25 mg to achieve and maintain moderate conscious sedation. The patient's heart rate, blood pressure, and oxygen saturation are monitored continuously during the procedure. The  period of conscious sedation is 37 minutes, of which I was present face-to-face 100% of this time. Estimated blood loss <50 mL. There were no immediate complications during the procedure.    Coronary Findings    Dominance: Right   Left Main  Vessel was injected. Vessel is normal in caliber. Vessel is angiographically normal.     Left Anterior Descending   . Ost LAD to Prox LAD lesion, 25% stenosed. Diffuse.   . First Diagonal Branch   . Ost 1st Diag to 1st Diag lesion, 40% stenosed.     Left Circumflex  Vessel was injected. Vessel is large. Vessel is angiographically normal.   . Third Obtuse Marginal Branch   The vessel is large in size.     Right Coronary Artery   . Prox RCA to Mid RCA lesion, 20% stenosed.       Right Heart Pressures Hemodynamic findings consistent with moderate pulmonary hypertension. Elevated LV EDP consistent with volume overload.    Coronary Diagrams    Diagnostic Diagram            Implants     No implant documentation for this case.    PACS Images    Show images for Cardiac catheterization     Link to Procedure Log    Procedure Log      Hemo Data       Most Recent Value   Fick Cardiac Output  3.81 L/min   Fick Cardiac Output Index  2.12 (L/min)/BSA   RA A Wave  10 mmHg   RA V Wave  5 mmHg   RA Mean  5 mmHg   RV Systolic Pressure  70 mmHg   RV Diastolic Pressure  3 mmHg   RV EDP  10 mmHg   PA Systolic Pressure  64 mmHg   PA Diastolic Pressure  24 mmHg   PA Mean  40 mmHg   PW A Wave  23 mmHg   PW V Wave  30 mmHg   PW Mean  24 mmHg   AO Systolic Pressure  0000000 mmHg   AO Diastolic Pressure  73 mmHg   AO Mean  123XX123 mmHg   LV Systolic Pressure  123XX123 mmHg   LV Diastolic Pressure  12 mmHg   LV EDP  20 mmHg   Arterial Occlusion Pressure Extended Systolic Pressure  [id number] mmHg   Arterial Occlusion Pressure Extended Diastolic Pressure  70 mmHg   Arterial Occlusion Pressure Extended Mean Pressure   110 mmHg   Left Ventricular Apex Extended Systolic Pressure  0000000 mmHg   Left Ventricular Apex Extended Diastolic Pressure  12 mmHg   Left Ventricular Apex Extended EDP Pressure  19 mmHg   QP/QS  1   TPVR Index  18.91 HRUI   TSVR Index  51.08 HRUI   PVR SVR Ratio  0.16   TPVR/TSVR Ratio  0.37    Order-Level Documents:    There are no order-level documents.    Encounter-Level Documents - 07/06/2015:      Scan on 07/08/2015 10:24 AM by Provider Default, MDScan on 07/08/2015 10:24 AM by Provider Default, MD     Scan on 07/08/2015 10:21 AM by Provider Default, MDScan on 07/08/2015 10:21 AM by Provider Default, MD     Scan on 07/08/2015 10:19 AM by Provider Default, MDScan on 07/08/2015 10:19 AM by Provider Default, MD     Scan on 07/06/2015 8:16 AM by Provider Default, MDScan on 07/06/2015 8:16 AM by Provider Default, MD     Electronic signature on 07/06/2015 5:30 AM    Signed    Electronically signed by Franciso Dierks M Martinique, MD on 07/06/15 at 0826 EDT     Lab Results  Component Value Date   WBC 6.5 07/01/2015   HGB 12.5 07/01/2015   HCT 37.6 07/01/2015   PLT 282 07/01/2015   GLUCOSE 112 (H) 07/01/2015   CHOL 155 05/19/2015   TRIG 151 (H) 05/19/2015   HDL 37 (L) 05/19/2015   LDLCALC 88 05/19/2015   ALT 5 (L) 05/19/2015   AST 13 05/19/2015   NA 141 07/01/2015   K 4.1 07/01/2015   CL 109 07/01/2015   CREATININE 1.13 (H) 07/01/2015   BUN 19 07/01/2015   CO2 21 07/01/2015   INR 1.03 07/06/2015     Assessment / Plan: 1. HOCM. Classic morphologic features by Echo and MRI. MRI also shows some subendocardial scar consistent with HCM.  Initially improved on increased doses of Toprol and lasix now class 2-3.  Echo shows increased LVOT gradient on medication and more SAM and MR. She has evidence of pulmonary HTN- confirmed on cardiac cath with elevated LV filling pressures. We have made referral for septal myectomy at [facility] clinic with Dr. Magdalene Molly.  Although she is  almost 43 she is still in good overall health and I think would benefit from surgery. To be seen in early October.   2. Murmur. Due to LVOT gradient and mod- severe  MR.   3. Palpitations. NSVT noted on monitor. Symptoms resolved on beta blocker.  If she develops syncope or presyncope she will need to see EP.  4. Obesity.  5. Hypercholesterolemia.   I will follow up in 3 months after surgical evaluation.

## 2015-10-20 DIAGNOSIS — D225 Melanocytic nevi of trunk: Secondary | ICD-10-CM | POA: Diagnosis not present

## 2015-10-20 DIAGNOSIS — L821 Other seborrheic keratosis: Secondary | ICD-10-CM | POA: Diagnosis not present

## 2015-10-20 DIAGNOSIS — L570 Actinic keratosis: Secondary | ICD-10-CM | POA: Diagnosis not present

## 2015-10-20 DIAGNOSIS — Z85828 Personal history of other malignant neoplasm of skin: Secondary | ICD-10-CM | POA: Diagnosis not present

## 2015-10-21 ENCOUNTER — Telehealth: Payer: Self-pay | Admitting: Cardiology

## 2015-10-21 NOTE — Telephone Encounter (Signed)
New message     Patient calling everything is a go for [facility] surgery date is  17.5.2017 flying out on  10.2.2017 hopefully returning on  10.13.2017

## 2015-10-21 NOTE — Telephone Encounter (Signed)
Returned call to patient-advised I would route to MD Martinique to make him aware regarding surgery at [facility] on 10/5.    Pt verbalized understanding.

## 2015-11-02 DIAGNOSIS — R9431 Abnormal electrocardiogram [ECG] [EKG]: Secondary | ICD-10-CM | POA: Diagnosis not present

## 2015-11-02 DIAGNOSIS — E782 Mixed hyperlipidemia: Secondary | ICD-10-CM | POA: Diagnosis not present

## 2015-11-02 DIAGNOSIS — I4891 Unspecified atrial fibrillation: Secondary | ICD-10-CM | POA: Diagnosis not present

## 2015-11-02 DIAGNOSIS — I059 Rheumatic mitral valve disease, unspecified: Secondary | ICD-10-CM | POA: Diagnosis not present

## 2015-11-02 DIAGNOSIS — R918 Other nonspecific abnormal finding of lung field: Secondary | ICD-10-CM | POA: Diagnosis not present

## 2015-11-02 DIAGNOSIS — R0602 Shortness of breath: Secondary | ICD-10-CM | POA: Diagnosis not present

## 2015-11-02 DIAGNOSIS — Z01810 Encounter for preprocedural cardiovascular examination: Secondary | ICD-10-CM | POA: Diagnosis not present

## 2015-11-02 DIAGNOSIS — I472 Ventricular tachycardia: Secondary | ICD-10-CM | POA: Diagnosis not present

## 2015-11-02 DIAGNOSIS — I340 Nonrheumatic mitral (valve) insufficiency: Secondary | ICD-10-CM | POA: Diagnosis not present

## 2015-11-02 DIAGNOSIS — I421 Obstructive hypertrophic cardiomyopathy: Secondary | ICD-10-CM | POA: Diagnosis not present

## 2015-11-03 DIAGNOSIS — R0602 Shortness of breath: Secondary | ICD-10-CM | POA: Diagnosis present

## 2015-11-03 DIAGNOSIS — I480 Paroxysmal atrial fibrillation: Secondary | ICD-10-CM | POA: Diagnosis not present

## 2015-11-03 DIAGNOSIS — Z01810 Encounter for preprocedural cardiovascular examination: Secondary | ICD-10-CM | POA: Diagnosis not present

## 2015-11-03 DIAGNOSIS — R918 Other nonspecific abnormal finding of lung field: Secondary | ICD-10-CM | POA: Diagnosis not present

## 2015-11-03 DIAGNOSIS — J90 Pleural effusion, not elsewhere classified: Secondary | ICD-10-CM | POA: Diagnosis not present

## 2015-11-03 DIAGNOSIS — I481 Persistent atrial fibrillation: Secondary | ICD-10-CM | POA: Diagnosis present

## 2015-11-03 DIAGNOSIS — N189 Chronic kidney disease, unspecified: Secondary | ICD-10-CM | POA: Diagnosis present

## 2015-11-03 DIAGNOSIS — I447 Left bundle-branch block, unspecified: Secondary | ICD-10-CM | POA: Diagnosis not present

## 2015-11-03 DIAGNOSIS — I973 Postprocedural hypertension: Secondary | ICD-10-CM | POA: Diagnosis not present

## 2015-11-03 DIAGNOSIS — Z79899 Other long term (current) drug therapy: Secondary | ICD-10-CM | POA: Diagnosis not present

## 2015-11-03 DIAGNOSIS — I421 Obstructive hypertrophic cardiomyopathy: Secondary | ICD-10-CM | POA: Diagnosis not present

## 2015-11-03 DIAGNOSIS — R001 Bradycardia, unspecified: Secondary | ICD-10-CM | POA: Diagnosis not present

## 2015-11-03 DIAGNOSIS — Z7901 Long term (current) use of anticoagulants: Secondary | ICD-10-CM | POA: Diagnosis not present

## 2015-11-03 DIAGNOSIS — I4891 Unspecified atrial fibrillation: Secondary | ICD-10-CM | POA: Diagnosis not present

## 2015-11-03 DIAGNOSIS — I340 Nonrheumatic mitral (valve) insufficiency: Secondary | ICD-10-CM | POA: Diagnosis present

## 2015-11-03 DIAGNOSIS — Z952 Presence of prosthetic heart valve: Secondary | ICD-10-CM | POA: Diagnosis not present

## 2015-11-03 DIAGNOSIS — E785 Hyperlipidemia, unspecified: Secondary | ICD-10-CM | POA: Diagnosis not present

## 2015-11-03 DIAGNOSIS — Z5181 Encounter for therapeutic drug level monitoring: Secondary | ICD-10-CM | POA: Diagnosis not present

## 2015-11-03 DIAGNOSIS — I482 Chronic atrial fibrillation: Secondary | ICD-10-CM | POA: Diagnosis not present

## 2015-11-03 DIAGNOSIS — I472 Ventricular tachycardia: Secondary | ICD-10-CM | POA: Diagnosis not present

## 2015-11-03 DIAGNOSIS — R739 Hyperglycemia, unspecified: Secondary | ICD-10-CM | POA: Diagnosis not present

## 2015-11-03 DIAGNOSIS — Z87891 Personal history of nicotine dependence: Secondary | ICD-10-CM | POA: Diagnosis not present

## 2015-11-03 DIAGNOSIS — J9811 Atelectasis: Secondary | ICD-10-CM | POA: Diagnosis not present

## 2015-11-03 DIAGNOSIS — R9431 Abnormal electrocardiogram [ECG] [EKG]: Secondary | ICD-10-CM | POA: Diagnosis not present

## 2015-11-03 DIAGNOSIS — I349 Nonrheumatic mitral valve disorder, unspecified: Secondary | ICD-10-CM | POA: Diagnosis not present

## 2015-11-03 DIAGNOSIS — I454 Nonspecific intraventricular block: Secondary | ICD-10-CM | POA: Diagnosis not present

## 2015-11-03 DIAGNOSIS — I059 Rheumatic mitral valve disease, unspecified: Secondary | ICD-10-CM | POA: Diagnosis present

## 2015-11-03 DIAGNOSIS — J690 Pneumonitis due to inhalation of food and vomit: Secondary | ICD-10-CM | POA: Diagnosis not present

## 2015-11-03 DIAGNOSIS — I129 Hypertensive chronic kidney disease with stage 1 through stage 4 chronic kidney disease, or unspecified chronic kidney disease: Secondary | ICD-10-CM | POA: Diagnosis present

## 2015-11-03 DIAGNOSIS — N179 Acute kidney failure, unspecified: Secondary | ICD-10-CM | POA: Diagnosis not present

## 2015-11-03 DIAGNOSIS — I361 Nonrheumatic tricuspid (valve) insufficiency: Secondary | ICD-10-CM | POA: Diagnosis not present

## 2015-11-03 DIAGNOSIS — I10 Essential (primary) hypertension: Secondary | ICD-10-CM | POA: Diagnosis not present

## 2015-11-03 DIAGNOSIS — G8918 Other acute postprocedural pain: Secondary | ICD-10-CM | POA: Diagnosis not present

## 2015-11-03 DIAGNOSIS — E861 Hypovolemia: Secondary | ICD-10-CM | POA: Diagnosis not present

## 2015-11-03 DIAGNOSIS — E782 Mixed hyperlipidemia: Secondary | ICD-10-CM | POA: Diagnosis not present

## 2015-11-03 DIAGNOSIS — Z4889 Encounter for other specified surgical aftercare: Secondary | ICD-10-CM | POA: Diagnosis not present

## 2015-11-03 DIAGNOSIS — I424 Endocardial fibroelastosis: Secondary | ICD-10-CM | POA: Diagnosis present

## 2015-11-03 DIAGNOSIS — I493 Ventricular premature depolarization: Secondary | ICD-10-CM | POA: Diagnosis not present

## 2015-11-03 DIAGNOSIS — I2510 Atherosclerotic heart disease of native coronary artery without angina pectoris: Secondary | ICD-10-CM | POA: Diagnosis present

## 2015-11-08 ENCOUNTER — Ambulatory Visit: Payer: Medicare Other | Admitting: Cardiology

## 2015-11-08 DIAGNOSIS — I421 Obstructive hypertrophic cardiomyopathy: Secondary | ICD-10-CM | POA: Diagnosis not present

## 2015-11-08 DIAGNOSIS — I4891 Unspecified atrial fibrillation: Secondary | ICD-10-CM | POA: Diagnosis not present

## 2015-11-08 DIAGNOSIS — I481 Persistent atrial fibrillation: Secondary | ICD-10-CM | POA: Diagnosis not present

## 2015-11-10 ENCOUNTER — Telehealth: Payer: Self-pay | Admitting: Cardiology

## 2015-11-10 NOTE — Telephone Encounter (Signed)
New Message  Pt voiced wanting nurse to call pt back regarding wanting sooner appt than 12/21/2015 due to pt having open heart surgery.  Advised pt of scheduling with PA/APP but pt declined and wants nurse to contact pt.  Please f/u with pt

## 2015-11-10 NOTE — Telephone Encounter (Signed)
Received records from [facility] for appointment on 12/21/15 with Dr Martinique.  Records given to [facility] (medical records) for Dr Doug Sou schedule on 12/21/15. lp

## 2015-11-11 NOTE — Telephone Encounter (Signed)
Returned call to patient's husband.Spoke to daughter.She stated mother had heart surgery 11/08/15 at [facility].Stated she is doing well.Stated mother will be discharged the end of week and she will need post hospital follow up with Dr.Jordan.Appointment scheduled with Dr.Jordan 11/17/15 at 10:30 am.

## 2015-11-12 ENCOUNTER — Telehealth: Payer: Self-pay | Admitting: Cardiology

## 2015-11-12 NOTE — Telephone Encounter (Signed)
I don't have a strong opinion. They are all OK.  Elgie Landino Martinique MD, [facility]

## 2015-11-12 NOTE — Telephone Encounter (Signed)
Pt will be discharged next week from the [facility]. She will need home health care when she gets home. She would like to know what agency Dr Martinique would recommend?

## 2015-11-12 NOTE — Telephone Encounter (Signed)
Returned call to [place], [facility] case Freight forwarder. He notes patient will be discharged home next week and will need consideration for PT and skilled nursing in the home. On behalf of patient, he is requesting Dr. Doug Sou recommendation for local home health agency. Informed him we have several options, would see which would be preferred. ( I gave him names of [facility], [place] as a starting point for research.)

## 2015-11-15 HISTORY — PX: MITRAL VALVE REPLACEMENT: SHX147

## 2015-11-17 ENCOUNTER — Telehealth: Payer: Self-pay | Admitting: Cardiology

## 2015-11-17 ENCOUNTER — Ambulatory Visit: Payer: Medicare Other | Admitting: Cardiology

## 2015-11-17 NOTE — Telephone Encounter (Signed)
Returned call to patient.She stated she just got home this afternoon from [facility].Stated she is doing well.Stated she had to cancel appointment with Dr.Jordan this morning.Appointment rescheduled with Dr.Jordan Friday 11/19/15 at 11:00 am.Stated she will bring copy of her records.

## 2015-11-17 NOTE — Telephone Encounter (Signed)
Returned call to patient no answer.LMTC. 

## 2015-11-17 NOTE — Telephone Encounter (Signed)
Please call,just getting back in town from her hospital stay.

## 2015-11-17 NOTE — Telephone Encounter (Signed)
Follow up    Pt husband verbalized that he is returning call for rn

## 2015-11-17 NOTE — Telephone Encounter (Signed)
Spoke to patient.[place] will be coming to see her this Sat 11/20/15.

## 2015-11-18 ENCOUNTER — Encounter: Payer: Self-pay | Admitting: Cardiology

## 2015-11-18 NOTE — Progress Notes (Signed)
Melinda Gonzales Date of Birth: October 31, 1934   History of Present Illness: Melinda Gonzales is seen for follow up post hospitalization for surgery for HOCM at the [facility] clinic.  She was evaluated in 2011 with a Myoview stress test which was normal. She had an Echo at that time that showed moderate LVH with EF 65-70%. Mild MR. She does have a history of murmur. She is not a smoker. In early 2016 she presented with increased dyspnea and an Echo and cardiac MRI were done. These with both consistent with HOCM. LVOT gradient of 112 mm Hg at rest. She was started on low dose lasix and Toprol XL with good response initially with improved dyspnea.  Seen earlier this year with symptoms of increased dyspnea. No increased edema or orthopnea but more dyspnea with walking or lifting. No chest pain. No dizziness or palpitations. Energy level has decreased. Repeat Echo showed severe LVOT gradient and MR.  She underwent cardiac cath with findings of LVOT gradient and MR. No significant CAD. Pulmonary HTN. She was referred to the [facility] clinic and underwent surgery on 11/05/15. Prior to surgery she developed Afib and had a DCCV. Surgery  included a septal myectomy, MVR with a #29 mm St. Jude  Biocor valve, left sided Cryo MAZE, and ligation of the left atrial appendage. Her post op course was remarkable for paroxysmal atrial fibrillation and she was started on amiodarone and Xarelto. At DC Hgb was 9.0. Potassium 3.1, creatinine 1.16. Ecg showed a LBBB. Repeat Echo on 11/11/15 showed normal LV function with EF 60%. Mean MV gradient 8 mm Hg. Trace MR. Mild TR. There was a fixed LVOT gradient of 32 mm Hg peak and mean of 16 mm Hg.   On follow up today she is doing OK. Still feels quite tired. Has some tightness around her incision. Notes some swelling in legs. No dyspnea or chest pain. Has not required narcotics. Is now taking metoprolol tid. Amiodarone bid. Lasix daily.   Current Outpatient Prescriptions on File  Prior to Visit  Medication Sig Dispense Refill  . multivitamin-lutein (OCUVITE-LUTEIN) CAPS capsule Take 1 capsule by mouth daily.    . potassium chloride SA (K-DUR,KLOR-CON) 20 MEQ tablet TAKE 1 TABLET BY MOUTH DAILY 30 tablet 10  . rosuvastatin (CRESTOR) 5 MG tablet Take 5 mg by mouth daily.       No current facility-administered medications on file prior to visit.     No Known Allergies  Past Medical History:  Diagnosis Date  . Chronic diastolic CHF (congestive heart failure) ([place]) 07/06/2015  . Dyspnea   . Heart murmur   . Hypercholesterolemia   . Hypertrophic obstructive cardiomyopathy (HOCM) ([place])   . LVH (left ventricular hypertrophy)    WITH NORMAL SYSTOLIC FUNCTION  . SOB (shortness of breath)     Past Surgical History:  Procedure Laterality Date  . CARDIAC CATHETERIZATION N/A 07/06/2015   Procedure: Right/Left Heart Cath and Coronary Angiography;  Surgeon: Riese Hellard M Martinique, MD;  Location: [place];  Service: Cardiovascular;  Laterality: N/A;    History  Smoking Status  . Former Smoker  . Quit date: 05/26/1965  Smokeless Tobacco  . Never Used    History  Alcohol Use No    Family History  Problem Relation Age of Onset  . Heart failure Mother 72  . Heart attack Father 90    Review of Systems: As noted in HPI.   All other systems were reviewed and are negative.  Physical Exam:  BP (!) 120/58   Pulse (!) 105   Ht 5\' 2"  (1.575 m)   Wt 175 lb (79.4 kg)   SpO2 97%   BMI 32.01 kg/m  She is an obese white female in no distress. Her HEENT exam is unremarkable. She has no JVD or bruits. Lungs are clear. Cardiac exam reveals a soft 1/6  systolic murmur at the  right upper sternal border radiating to the apex. There is no S3. Sternotomy site is healing well.Abdomen is soft and nontender. She has 1+ edema. Pedal pulses are excellent.  Neurologic exam is nonfocal. Body mass index is 32.01 kg/m.   LABORATORY DATA:   Lab Results  Component Value Date    WBC 6.5 07/01/2015   HGB 12.5 07/01/2015   HCT 37.6 07/01/2015   PLT 282 07/01/2015   GLUCOSE 112 (H) 07/01/2015   CHOL 155 05/19/2015   TRIG 151 (H) 05/19/2015   HDL 37 (L) 05/19/2015   LDLCALC 88 05/19/2015   ALT 5 (L) 05/19/2015   AST 13 05/19/2015   NA 141 07/01/2015   K 4.1 07/01/2015   CL 109 07/01/2015   CREATININE 1.13 (H) 07/01/2015   BUN 19 07/01/2015   CO2 21 07/01/2015   INR 1.03 07/06/2015   Ecg today shows atrial fibrillation with rate 105. LBBB. I have personally reviewed and interpreted this study.   Assessment / Plan: 1. HOCM. Classic morphologic features by Echo and MRI. MRI also shows some subendocardial scar consistent with HCM.  Initially improved on increased doses of Toprol and lasix but symptoms persisted.  Echo showed increased LVOT gradient on medication and more SAM and MR. She had evidence of pulmonary HTN- confirmed on cardiac cath with elevated LV filling pressures. Now s/p septal myectomy on 11/05/15. Good result by follow up Echo. Recovering nicely. Plan to repeat Echo in about 3 months.   2. Severe MR s/p MVR with #29 St. Jude Biocor valve.  3.  NSVT Symptoms resolved on beta blocker.    4. Paroxysmal Atrial fibrillation post op open heart surgery. S/p left sided MAZE. Currently on amiodarone. Rate well controlled. Hopefully as she recovers from MAZE and with additional amiodarone she will return to NSR. In one week will reduce amiodarone to 200 mg daily. Continue metoprolol. Continue Xarelto. Check chemistries today. Will need TSH next month. If she remain in Afib at 3 months will consider DCCV.   5. Hypercholesterolemia.   6. Post op anemia.  Plan: check CBC, CMET today. Check CXR. Home health RN and PT. I will follow up in one month. Hopefully at that time she will be ready to begin Cardiac Rehab.

## 2015-11-19 ENCOUNTER — Ambulatory Visit
Admission: RE | Admit: 2015-11-19 | Discharge: 2015-11-19 | Disposition: A | Discharge status: Home / Self Care | Payer: Medicare Other | Source: Ambulatory Visit | Attending: Cardiology | Admitting: Cardiology

## 2015-11-19 ENCOUNTER — Other Ambulatory Visit: Payer: Self-pay

## 2015-11-19 ENCOUNTER — Ambulatory Visit (INDEPENDENT_AMBULATORY_CARE_PROVIDER_SITE_OTHER): Payer: Medicare Other | Admitting: Cardiology

## 2015-11-19 ENCOUNTER — Encounter: Payer: Self-pay | Admitting: Cardiology

## 2015-11-19 VITALS — BP 120/58 | HR 105 | Ht 62.0 in | Wt 175.0 lb

## 2015-11-19 DIAGNOSIS — R0602 Shortness of breath: Secondary | ICD-10-CM | POA: Diagnosis not present

## 2015-11-19 DIAGNOSIS — I5032 Chronic diastolic (congestive) heart failure: Secondary | ICD-10-CM

## 2015-11-19 DIAGNOSIS — I340 Nonrheumatic mitral (valve) insufficiency: Secondary | ICD-10-CM | POA: Insufficient documentation

## 2015-11-19 DIAGNOSIS — E7800 Pure hypercholesterolemia, unspecified: Secondary | ICD-10-CM | POA: Diagnosis not present

## 2015-11-19 DIAGNOSIS — I517 Cardiomegaly: Secondary | ICD-10-CM

## 2015-11-19 DIAGNOSIS — I421 Obstructive hypertrophic cardiomyopathy: Secondary | ICD-10-CM

## 2015-11-19 DIAGNOSIS — Z952 Presence of prosthetic heart valve: Secondary | ICD-10-CM | POA: Diagnosis not present

## 2015-11-19 LAB — COMPREHENSIVE METABOLIC PANEL
ALBUMIN: 3.4 g/dL — AB (ref 3.6–5.1)
ALT: 14 U/L (ref 6–29)
AST: 19 U/L (ref 10–35)
Alkaline Phosphatase: 69 U/L (ref 33–130)
BUN: 18 mg/dL (ref 7–25)
CALCIUM: 8.9 mg/dL (ref 8.6–10.4)
CHLORIDE: 102 mmol/L (ref 98–110)
CO2: 23 mmol/L (ref 20–31)
Creat: 1.12 mg/dL — ABNORMAL HIGH (ref 0.60–0.93)
Glucose, Bld: 78 mg/dL (ref 65–99)
POTASSIUM: 5.4 mmol/L — AB (ref 3.5–5.3)
Sodium: 138 mmol/L (ref 135–146)
Total Bilirubin: 0.3 mg/dL (ref 0.2–1.2)
Total Protein: 6.3 g/dL (ref 6.1–8.1)

## 2015-11-19 MED ORDER — POTASSIUM CHLORIDE CRYS ER 20 MEQ PO TBCR: 20.0000 meq | EXTENDED_RELEASE_TABLET | Freq: Every day | ORAL | 3 refills | Status: DC

## 2015-11-19 MED ORDER — METOPROLOL TARTRATE 25 MG PO TABS: 25.0000 mg | ORAL_TABLET | Freq: Three times a day (TID) | ORAL | 3 refills | Status: DC

## 2015-11-19 MED ORDER — ROSUVASTATIN CALCIUM 5 MG PO TABS: 5.0000 mg | ORAL_TABLET | Freq: Every day | ORAL | 3 refills | Status: DC

## 2015-11-19 MED ORDER — AMIODARONE HCL 200 MG PO TABS: 200.0000 mg | ORAL_TABLET | Freq: Every day | ORAL | 3 refills | Status: DC

## 2015-11-19 MED ORDER — MAGNESIUM OXIDE 400 MG PO TABS: 400.0000 mg | ORAL_TABLET | Freq: Every day | ORAL | 3 refills | Status: DC

## 2015-11-19 MED ORDER — RIVAROXABAN 15 MG PO TABS: 15.0000 mg | ORAL_TABLET | Freq: Two times a day (BID) | ORAL | 3 refills | Status: DC

## 2015-11-19 MED ORDER — PANTOPRAZOLE SODIUM 20 MG PO TBEC: 20.0000 mg | DELAYED_RELEASE_TABLET | Freq: Every day | ORAL | 3 refills | Status: DC

## 2015-11-19 MED ORDER — FUROSEMIDE 20 MG PO TABS: ORAL_TABLET | ORAL | 3 refills | Status: DC

## 2015-11-19 NOTE — Patient Instructions (Signed)
Continue your current therapy  We will arrange home health nursing and PT assessment.  You need to continue Lasix daily  We will check lab work and a chest X ray today.  I will see you in one month

## 2015-11-20 DIAGNOSIS — Z48812 Encounter for surgical aftercare following surgery on the circulatory system: Secondary | ICD-10-CM | POA: Diagnosis not present

## 2015-11-20 DIAGNOSIS — I472 Ventricular tachycardia: Secondary | ICD-10-CM | POA: Diagnosis not present

## 2015-11-20 DIAGNOSIS — I481 Persistent atrial fibrillation: Secondary | ICD-10-CM | POA: Diagnosis not present

## 2015-11-20 DIAGNOSIS — I421 Obstructive hypertrophic cardiomyopathy: Secondary | ICD-10-CM | POA: Diagnosis not present

## 2015-11-20 DIAGNOSIS — E785 Hyperlipidemia, unspecified: Secondary | ICD-10-CM | POA: Diagnosis not present

## 2015-11-20 DIAGNOSIS — Z953 Presence of xenogenic heart valve: Secondary | ICD-10-CM | POA: Diagnosis not present

## 2015-11-20 LAB — CBC WITH DIFFERENTIAL/PLATELET
BASOS ABS: 0 {cells}/uL (ref 0–200)
Basophils Relative: 0 %
Eosinophils Absolute: 700 cells/uL — ABNORMAL HIGH (ref 15–500)
Eosinophils Relative: 5 %
HEMATOCRIT: 31.4 % — AB (ref 35.0–45.0)
Hemoglobin: 10.3 g/dL — ABNORMAL LOW (ref 11.7–15.5)
LYMPHS ABS: 1540 {cells}/uL (ref 850–3900)
LYMPHS PCT: 11 %
MCH: 31.3 pg (ref 27.0–33.0)
MCHC: 32.8 g/dL (ref 32.0–36.0)
MCV: 95.4 fL (ref 80.0–100.0)
MONO ABS: 1120 {cells}/uL — AB (ref 200–950)
MPV: 9.4 fL (ref 7.5–12.5)
Monocytes Relative: 8 %
NEUTROS PCT: 76 %
Neutro Abs: 10640 cells/uL — ABNORMAL HIGH (ref 1500–7800)
Platelets: 586 10*3/uL — ABNORMAL HIGH (ref 140–400)
RBC: 3.29 MIL/uL — ABNORMAL LOW (ref 3.80–5.10)
RDW: 14.4 % (ref 11.0–15.0)
WBC: 14 10*3/uL — ABNORMAL HIGH (ref 3.8–10.8)

## 2015-11-22 ENCOUNTER — Telehealth: Payer: Self-pay | Admitting: Cardiology

## 2015-11-22 DIAGNOSIS — Z953 Presence of xenogenic heart valve: Secondary | ICD-10-CM | POA: Diagnosis not present

## 2015-11-22 DIAGNOSIS — E785 Hyperlipidemia, unspecified: Secondary | ICD-10-CM | POA: Diagnosis not present

## 2015-11-22 DIAGNOSIS — I421 Obstructive hypertrophic cardiomyopathy: Secondary | ICD-10-CM | POA: Diagnosis not present

## 2015-11-22 DIAGNOSIS — Z48812 Encounter for surgical aftercare following surgery on the circulatory system: Secondary | ICD-10-CM | POA: Diagnosis not present

## 2015-11-22 DIAGNOSIS — I472 Ventricular tachycardia: Secondary | ICD-10-CM | POA: Diagnosis not present

## 2015-11-22 DIAGNOSIS — I481 Persistent atrial fibrillation: Secondary | ICD-10-CM | POA: Diagnosis not present

## 2015-11-22 MED ORDER — FUROSEMIDE 40 MG PO TABS: ORAL_TABLET | ORAL | 3 refills | Status: DC

## 2015-11-22 NOTE — Telephone Encounter (Signed)
Then I would have her take 40 mg in am, 20 mg in pm for 4 days then 40 mg daily after.  Saman Umstead Martinique MD, [facility]

## 2015-11-22 NOTE — Telephone Encounter (Signed)
New Message  Melinda Gonzales from [facility] call requesting to speak with RN. About new instructions for pt medication. Melinda Gonzales states pt was previously taking 20mg  2x daily and states it was changed to 40mg  once in the morning. Melinda. [name] states the meds have not been changed and would like to speak with RN to discuss. Please call back to advise.

## 2015-11-22 NOTE — Telephone Encounter (Signed)
Spoke to patient's husband and to [place] with [place] recommendations given.

## 2015-11-22 NOTE — Telephone Encounter (Signed)
Returned call to [employer] with [facility].She stated pt was told on Fri 10/20 to increase Lasix to 40 mg daily for 4 days then take 20 mg daily.Stated she has been taking Lasix 20 mg twice a day.Lasix was listed incorrectly in her chart.Advised I will send message to [place] for advice.

## 2015-11-23 DIAGNOSIS — Z48812 Encounter for surgical aftercare following surgery on the circulatory system: Secondary | ICD-10-CM | POA: Diagnosis not present

## 2015-11-23 DIAGNOSIS — I472 Ventricular tachycardia: Secondary | ICD-10-CM | POA: Diagnosis not present

## 2015-11-23 DIAGNOSIS — I421 Obstructive hypertrophic cardiomyopathy: Secondary | ICD-10-CM | POA: Diagnosis not present

## 2015-11-23 DIAGNOSIS — Z953 Presence of xenogenic heart valve: Secondary | ICD-10-CM | POA: Diagnosis not present

## 2015-11-23 DIAGNOSIS — I481 Persistent atrial fibrillation: Secondary | ICD-10-CM | POA: Diagnosis not present

## 2015-11-23 DIAGNOSIS — E785 Hyperlipidemia, unspecified: Secondary | ICD-10-CM | POA: Diagnosis not present

## 2015-11-24 ENCOUNTER — Other Ambulatory Visit: Payer: Self-pay

## 2015-11-24 DIAGNOSIS — I472 Ventricular tachycardia: Secondary | ICD-10-CM | POA: Diagnosis not present

## 2015-11-24 DIAGNOSIS — I421 Obstructive hypertrophic cardiomyopathy: Secondary | ICD-10-CM | POA: Diagnosis not present

## 2015-11-24 DIAGNOSIS — Z953 Presence of xenogenic heart valve: Secondary | ICD-10-CM | POA: Diagnosis not present

## 2015-11-24 DIAGNOSIS — E785 Hyperlipidemia, unspecified: Secondary | ICD-10-CM | POA: Diagnosis not present

## 2015-11-24 DIAGNOSIS — Z48812 Encounter for surgical aftercare following surgery on the circulatory system: Secondary | ICD-10-CM | POA: Diagnosis not present

## 2015-11-24 DIAGNOSIS — I481 Persistent atrial fibrillation: Secondary | ICD-10-CM | POA: Diagnosis not present

## 2015-11-25 DIAGNOSIS — E785 Hyperlipidemia, unspecified: Secondary | ICD-10-CM | POA: Diagnosis not present

## 2015-11-25 DIAGNOSIS — I481 Persistent atrial fibrillation: Secondary | ICD-10-CM | POA: Diagnosis not present

## 2015-11-25 DIAGNOSIS — Z953 Presence of xenogenic heart valve: Secondary | ICD-10-CM | POA: Diagnosis not present

## 2015-11-25 DIAGNOSIS — Z48812 Encounter for surgical aftercare following surgery on the circulatory system: Secondary | ICD-10-CM | POA: Diagnosis not present

## 2015-11-25 DIAGNOSIS — I472 Ventricular tachycardia: Secondary | ICD-10-CM | POA: Diagnosis not present

## 2015-11-25 DIAGNOSIS — I421 Obstructive hypertrophic cardiomyopathy: Secondary | ICD-10-CM | POA: Diagnosis not present

## 2015-11-30 DIAGNOSIS — Z23 Encounter for immunization: Secondary | ICD-10-CM | POA: Diagnosis not present

## 2015-11-30 DIAGNOSIS — I421 Obstructive hypertrophic cardiomyopathy: Secondary | ICD-10-CM | POA: Diagnosis not present

## 2015-11-30 DIAGNOSIS — Z48812 Encounter for surgical aftercare following surgery on the circulatory system: Secondary | ICD-10-CM | POA: Diagnosis not present

## 2015-11-30 DIAGNOSIS — I472 Ventricular tachycardia: Secondary | ICD-10-CM | POA: Diagnosis not present

## 2015-11-30 DIAGNOSIS — Z953 Presence of xenogenic heart valve: Secondary | ICD-10-CM | POA: Diagnosis not present

## 2015-11-30 DIAGNOSIS — E785 Hyperlipidemia, unspecified: Secondary | ICD-10-CM | POA: Diagnosis not present

## 2015-11-30 DIAGNOSIS — I481 Persistent atrial fibrillation: Secondary | ICD-10-CM | POA: Diagnosis not present

## 2015-12-01 DIAGNOSIS — E785 Hyperlipidemia, unspecified: Secondary | ICD-10-CM | POA: Diagnosis not present

## 2015-12-01 DIAGNOSIS — I481 Persistent atrial fibrillation: Secondary | ICD-10-CM | POA: Diagnosis not present

## 2015-12-01 DIAGNOSIS — Z48812 Encounter for surgical aftercare following surgery on the circulatory system: Secondary | ICD-10-CM | POA: Diagnosis not present

## 2015-12-01 DIAGNOSIS — I472 Ventricular tachycardia: Secondary | ICD-10-CM | POA: Diagnosis not present

## 2015-12-01 DIAGNOSIS — I421 Obstructive hypertrophic cardiomyopathy: Secondary | ICD-10-CM | POA: Diagnosis not present

## 2015-12-01 DIAGNOSIS — Z953 Presence of xenogenic heart valve: Secondary | ICD-10-CM | POA: Diagnosis not present

## 2015-12-02 DIAGNOSIS — I421 Obstructive hypertrophic cardiomyopathy: Secondary | ICD-10-CM | POA: Diagnosis not present

## 2015-12-02 DIAGNOSIS — I472 Ventricular tachycardia: Secondary | ICD-10-CM | POA: Diagnosis not present

## 2015-12-02 DIAGNOSIS — Z953 Presence of xenogenic heart valve: Secondary | ICD-10-CM | POA: Diagnosis not present

## 2015-12-02 DIAGNOSIS — I481 Persistent atrial fibrillation: Secondary | ICD-10-CM | POA: Diagnosis not present

## 2015-12-02 DIAGNOSIS — E785 Hyperlipidemia, unspecified: Secondary | ICD-10-CM | POA: Diagnosis not present

## 2015-12-02 DIAGNOSIS — Z48812 Encounter for surgical aftercare following surgery on the circulatory system: Secondary | ICD-10-CM | POA: Diagnosis not present

## 2015-12-07 DIAGNOSIS — E785 Hyperlipidemia, unspecified: Secondary | ICD-10-CM | POA: Diagnosis not present

## 2015-12-07 DIAGNOSIS — Z48812 Encounter for surgical aftercare following surgery on the circulatory system: Secondary | ICD-10-CM | POA: Diagnosis not present

## 2015-12-07 DIAGNOSIS — I481 Persistent atrial fibrillation: Secondary | ICD-10-CM | POA: Diagnosis not present

## 2015-12-07 DIAGNOSIS — I472 Ventricular tachycardia: Secondary | ICD-10-CM | POA: Diagnosis not present

## 2015-12-07 DIAGNOSIS — I421 Obstructive hypertrophic cardiomyopathy: Secondary | ICD-10-CM | POA: Diagnosis not present

## 2015-12-07 DIAGNOSIS — Z953 Presence of xenogenic heart valve: Secondary | ICD-10-CM | POA: Diagnosis not present

## 2015-12-09 DIAGNOSIS — I481 Persistent atrial fibrillation: Secondary | ICD-10-CM | POA: Diagnosis not present

## 2015-12-09 DIAGNOSIS — Z48812 Encounter for surgical aftercare following surgery on the circulatory system: Secondary | ICD-10-CM | POA: Diagnosis not present

## 2015-12-09 DIAGNOSIS — Z953 Presence of xenogenic heart valve: Secondary | ICD-10-CM | POA: Diagnosis not present

## 2015-12-09 DIAGNOSIS — E785 Hyperlipidemia, unspecified: Secondary | ICD-10-CM | POA: Diagnosis not present

## 2015-12-09 DIAGNOSIS — I472 Ventricular tachycardia: Secondary | ICD-10-CM | POA: Diagnosis not present

## 2015-12-09 DIAGNOSIS — I421 Obstructive hypertrophic cardiomyopathy: Secondary | ICD-10-CM | POA: Diagnosis not present

## 2015-12-13 DIAGNOSIS — E785 Hyperlipidemia, unspecified: Secondary | ICD-10-CM | POA: Diagnosis not present

## 2015-12-13 DIAGNOSIS — I481 Persistent atrial fibrillation: Secondary | ICD-10-CM | POA: Diagnosis not present

## 2015-12-13 DIAGNOSIS — I472 Ventricular tachycardia: Secondary | ICD-10-CM | POA: Diagnosis not present

## 2015-12-13 DIAGNOSIS — I421 Obstructive hypertrophic cardiomyopathy: Secondary | ICD-10-CM | POA: Diagnosis not present

## 2015-12-13 DIAGNOSIS — Z953 Presence of xenogenic heart valve: Secondary | ICD-10-CM | POA: Diagnosis not present

## 2015-12-13 DIAGNOSIS — Z48812 Encounter for surgical aftercare following surgery on the circulatory system: Secondary | ICD-10-CM | POA: Diagnosis not present

## 2015-12-14 DIAGNOSIS — I472 Ventricular tachycardia: Secondary | ICD-10-CM | POA: Diagnosis not present

## 2015-12-14 DIAGNOSIS — Z953 Presence of xenogenic heart valve: Secondary | ICD-10-CM | POA: Diagnosis not present

## 2015-12-14 DIAGNOSIS — I481 Persistent atrial fibrillation: Secondary | ICD-10-CM | POA: Diagnosis not present

## 2015-12-14 DIAGNOSIS — E785 Hyperlipidemia, unspecified: Secondary | ICD-10-CM | POA: Diagnosis not present

## 2015-12-14 DIAGNOSIS — Z48812 Encounter for surgical aftercare following surgery on the circulatory system: Secondary | ICD-10-CM | POA: Diagnosis not present

## 2015-12-14 DIAGNOSIS — I421 Obstructive hypertrophic cardiomyopathy: Secondary | ICD-10-CM | POA: Diagnosis not present

## 2015-12-16 DIAGNOSIS — E785 Hyperlipidemia, unspecified: Secondary | ICD-10-CM | POA: Diagnosis not present

## 2015-12-16 DIAGNOSIS — I481 Persistent atrial fibrillation: Secondary | ICD-10-CM | POA: Diagnosis not present

## 2015-12-16 DIAGNOSIS — Z953 Presence of xenogenic heart valve: Secondary | ICD-10-CM | POA: Diagnosis not present

## 2015-12-16 DIAGNOSIS — I472 Ventricular tachycardia: Secondary | ICD-10-CM | POA: Diagnosis not present

## 2015-12-16 DIAGNOSIS — I421 Obstructive hypertrophic cardiomyopathy: Secondary | ICD-10-CM | POA: Diagnosis not present

## 2015-12-16 DIAGNOSIS — Z48812 Encounter for surgical aftercare following surgery on the circulatory system: Secondary | ICD-10-CM | POA: Diagnosis not present

## 2015-12-20 DIAGNOSIS — Z48812 Encounter for surgical aftercare following surgery on the circulatory system: Secondary | ICD-10-CM | POA: Diagnosis not present

## 2015-12-20 DIAGNOSIS — E785 Hyperlipidemia, unspecified: Secondary | ICD-10-CM | POA: Diagnosis not present

## 2015-12-20 DIAGNOSIS — I481 Persistent atrial fibrillation: Secondary | ICD-10-CM | POA: Diagnosis not present

## 2015-12-20 DIAGNOSIS — I421 Obstructive hypertrophic cardiomyopathy: Secondary | ICD-10-CM | POA: Diagnosis not present

## 2015-12-20 DIAGNOSIS — I472 Ventricular tachycardia: Secondary | ICD-10-CM | POA: Diagnosis not present

## 2015-12-20 DIAGNOSIS — Z953 Presence of xenogenic heart valve: Secondary | ICD-10-CM | POA: Diagnosis not present

## 2015-12-20 NOTE — Progress Notes (Signed)
[name] Date of Birth: [birth year]   History of Present Illness: Melinda Gonzales is seen for follow up of HOCM  She was evaluated in 2011 with a Myoview stress test which was normal. She had an Echo at that time that showed moderate LVH with EF 65-70%. Mild MR. She does have a history of murmur. She is not a smoker. In early 2016 she presented with increased dyspnea and an Echo and cardiac MRI were done. These with both consistent with HOCM. LVOT gradient of 112 mm Hg at rest. She was started on low dose lasix and Toprol XL with good response initially with improved dyspnea.  Seen earlier this year with symptoms of increased dyspnea. No increased edema or orthopnea but more dyspnea with walking or lifting. No chest pain. No dizziness or palpitations. Energy level has decreased. Repeat Echo showed severe LVOT gradient and MR.  She underwent cardiac cath with findings of LVOT gradient and MR. No significant CAD. Pulmonary HTN. She was referred to the [facility] clinic and underwent surgery on 11/05/15. Prior to surgery she developed Afib and had a DCCV. Surgery  included a septal myectomy, MVR with a #29 mm St. Jude  Biocor valve, left sided Cryo MAZE, and ligation of the left atrial appendage. Her post op course was remarkable for paroxysmal atrial fibrillation and she was started on amiodarone and Xarelto. At DC Hgb was 9.0. Potassium 3.1, creatinine 1.16. Ecg showed a LBBB. Repeat Echo on 11/11/15 showed normal LV function with EF 60%. Mean MV gradient 8 mm Hg. Trace MR. Mild TR. There was a fixed LVOT gradient of 32 mm Hg peak and mean of 16 mm Hg.   On follow up today she is doing well. Her energy level has improved. She is finishing home PT and has made good progress.   No dyspnea or chest pain. No palpitations or tachycardia. She has some minor nosebleeds.   Current Outpatient Prescriptions on File Prior to Visit  Medication Sig Dispense Refill  . acetaminophen (TYLENOL) 325 MG tablet  Take 650 mg by mouth every 6 (six) hours as needed.    Marland Kitchen amiodarone (PACERONE) 200 MG tablet Take 1 tablet (200 mg total) by mouth daily. 90 tablet 3  . metoprolol tartrate (LOPRESSOR) 25 MG tablet Take 1 tablet (25 mg total) by mouth every 8 (eight) hours. 270 tablet 3  . pantoprazole (PROTONIX) 20 MG tablet Take 1 tablet (20 mg total) by mouth daily. 90 tablet 3  . Rivaroxaban (XARELTO) 15 MG TABS tablet Take 1 tablet (15 mg total) by mouth 2 (two) times daily with a meal. 180 tablet 3  . rosuvastatin (CRESTOR) 5 MG tablet Take 1 tablet (5 mg total) by mouth daily. 90 tablet 3  . multivitamin-lutein (OCUVITE-LUTEIN) CAPS capsule Take 1 capsule by mouth daily.     No current facility-administered medications on file prior to visit.     Allergies  Allergen Reactions  . Oxycodone Nausea And Vomiting    And the sweats    Past Medical History:  Diagnosis Date  . Chronic diastolic CHF (congestive heart failure) ([place]) 07/06/2015  . Dyspnea   . Heart murmur   . Hypercholesterolemia   . Hypertrophic obstructive cardiomyopathy (HOCM) ([place])   . LVH (left ventricular hypertrophy)    WITH NORMAL SYSTOLIC FUNCTION  . SOB (shortness of breath)     Past Surgical History:  Procedure Laterality Date  . CARDIAC CATHETERIZATION N/A 07/06/2015   Procedure: Right/Left Heart Cath and Coronary  Angiography;  Surgeon: Thong Feeny M Martinique, MD;  Location: [place];  Service: Cardiovascular;  Laterality: N/A;    History  Smoking Status  . Former Smoker  . Quit date: 05/26/1965  Smokeless Tobacco  . Never Used    History  Alcohol Use No    Family History  Problem Relation Age of Onset  . Heart failure Mother 10  . Heart attack Father 73    Review of Systems: As noted in HPI.   All other systems were reviewed and are negative.  Physical Exam: BP 120/70 (BP Location: Left Arm, Patient Position: Sitting, Cuff Size: Normal)   Pulse 68   Ht 5\' 1"  (1.549 m)   Wt 170 lb 3.2 oz (77.2 kg)    BMI 32.16 kg/m  She is an obese white female in no distress. Her HEENT exam is unremarkable. She has no JVD or bruits. Lungs are clear. Cardiac exam reveals a soft 2/6  systolic murmur at the  right upper sternal border radiating to the apex. There is no S3. Sternotomy site is healing well.Abdomen is soft and nontender. She has tr edema. Pedal pulses are excellent.  Neurologic exam is nonfocal. Body mass index is 32.16 kg/m.   LABORATORY DATA:   Lab Results  Component Value Date   WBC 14.0 (H) 11/19/2015   HGB 10.3 (L) 11/19/2015   HCT 31.4 (L) 11/19/2015   PLT 586 (H) 11/19/2015   GLUCOSE 78 11/19/2015   CHOL 155 05/19/2015   TRIG 151 (H) 05/19/2015   HDL 37 (L) 05/19/2015   LDLCALC 88 05/19/2015   ALT 14 11/19/2015   AST 19 11/19/2015   NA 138 11/19/2015   K 5.4 (H) 11/19/2015   CL 102 11/19/2015   CREATININE 1.12 (H) 11/19/2015   BUN 18 11/19/2015   CO2 23 11/19/2015   INR 1.03 07/06/2015    CLINICAL DATA:  Shortness of breath.  EXAM: CHEST  2 VIEW  COMPARISON:  Radiographs of October 31, 2014.  FINDINGS: Stable cardiomegaly. Atherosclerosis of thoracic aorta is noted. Sternotomy wires are noted. Mild bilateral pleural effusions are noted, with right greater than left. Probable underlying atelectasis in right lung base. Bony thorax is unremarkable.  IMPRESSION: Mild bilateral pleural effusions, with right greater than left, with probable underlying atelectasis in right lung base. Aortic atherosclerosis.   Electronically Signed   By: Marijo Conception, M.D.   On: 11/19/2015 15:01  Assessment / Plan: 1. HOCM. Classic morphologic features by Echo and MRI. MRI also shows some subendocardial scar consistent with HCM.  Initially improved on increased doses of Toprol and lasix but symptoms persisted.  Echo showed increased LVOT gradient on medication and more SAM and MR. She had evidence of pulmonary HTN- confirmed on cardiac cath with elevated LV filling  pressures. Now s/p septal myectomy on 11/05/15. Good result by follow up Echo. Recovering nicely. Will continue current therapy and follow up in 2 months. Plan to repeat Echo at that time and fasting lab work. Will refer to outpatient cardiac Rehab.   2. Severe MR s/p MVR with #29 St. Jude Biocor valve. Routine SBE prophylaxis.   3.  NSVT Symptoms resolved on beta blocker.    4. Paroxysmal Atrial fibrillation post op open heart surgery. S/p left sided MAZE. Currently on amiodarone 200 mg daily. Rate well controlled and pulse is regular today.  Continue metoprolol. Continue Xarelto. If she is in NSR on follow up visit we will plan to stop amiodarone and anticoagulation.  5. Hypercholesterolemia.   6. Post op anemia. Improved.

## 2015-12-21 ENCOUNTER — Ambulatory Visit (INDEPENDENT_AMBULATORY_CARE_PROVIDER_SITE_OTHER): Payer: Medicare Other | Admitting: Cardiology

## 2015-12-21 ENCOUNTER — Encounter: Payer: Self-pay | Admitting: Cardiology

## 2015-12-21 VITALS — BP 120/70 | HR 68 | Ht 61.0 in | Wt 170.2 lb

## 2015-12-21 DIAGNOSIS — I472 Ventricular tachycardia: Secondary | ICD-10-CM | POA: Diagnosis not present

## 2015-12-21 DIAGNOSIS — I4891 Unspecified atrial fibrillation: Secondary | ICD-10-CM | POA: Insufficient documentation

## 2015-12-21 DIAGNOSIS — I4729 Other ventricular tachycardia: Secondary | ICD-10-CM

## 2015-12-21 DIAGNOSIS — I5032 Chronic diastolic (congestive) heart failure: Secondary | ICD-10-CM

## 2015-12-21 DIAGNOSIS — I480 Paroxysmal atrial fibrillation: Secondary | ICD-10-CM | POA: Diagnosis not present

## 2015-12-21 DIAGNOSIS — Z952 Presence of prosthetic heart valve: Secondary | ICD-10-CM

## 2015-12-21 DIAGNOSIS — I421 Obstructive hypertrophic cardiomyopathy: Secondary | ICD-10-CM | POA: Diagnosis not present

## 2015-12-21 NOTE — Patient Instructions (Signed)
Stop magnesium  Continue your other therapy  I will see you in 2 months with repeat lab work and an Echocardiogram

## 2015-12-22 DIAGNOSIS — E785 Hyperlipidemia, unspecified: Secondary | ICD-10-CM | POA: Diagnosis not present

## 2015-12-22 DIAGNOSIS — I421 Obstructive hypertrophic cardiomyopathy: Secondary | ICD-10-CM | POA: Diagnosis not present

## 2015-12-22 DIAGNOSIS — I472 Ventricular tachycardia: Secondary | ICD-10-CM | POA: Diagnosis not present

## 2015-12-22 DIAGNOSIS — I481 Persistent atrial fibrillation: Secondary | ICD-10-CM | POA: Diagnosis not present

## 2015-12-22 DIAGNOSIS — Z48812 Encounter for surgical aftercare following surgery on the circulatory system: Secondary | ICD-10-CM | POA: Diagnosis not present

## 2015-12-22 DIAGNOSIS — Z953 Presence of xenogenic heart valve: Secondary | ICD-10-CM | POA: Diagnosis not present

## 2015-12-28 DIAGNOSIS — Z48812 Encounter for surgical aftercare following surgery on the circulatory system: Secondary | ICD-10-CM | POA: Diagnosis not present

## 2015-12-28 DIAGNOSIS — I481 Persistent atrial fibrillation: Secondary | ICD-10-CM | POA: Diagnosis not present

## 2015-12-28 DIAGNOSIS — I472 Ventricular tachycardia: Secondary | ICD-10-CM | POA: Diagnosis not present

## 2015-12-28 DIAGNOSIS — I421 Obstructive hypertrophic cardiomyopathy: Secondary | ICD-10-CM | POA: Diagnosis not present

## 2015-12-28 DIAGNOSIS — E785 Hyperlipidemia, unspecified: Secondary | ICD-10-CM | POA: Diagnosis not present

## 2015-12-28 DIAGNOSIS — Z953 Presence of xenogenic heart valve: Secondary | ICD-10-CM | POA: Diagnosis not present

## 2015-12-30 DIAGNOSIS — I472 Ventricular tachycardia: Secondary | ICD-10-CM | POA: Diagnosis not present

## 2015-12-30 DIAGNOSIS — I421 Obstructive hypertrophic cardiomyopathy: Secondary | ICD-10-CM | POA: Diagnosis not present

## 2015-12-30 DIAGNOSIS — Z48812 Encounter for surgical aftercare following surgery on the circulatory system: Secondary | ICD-10-CM | POA: Diagnosis not present

## 2015-12-30 DIAGNOSIS — Z953 Presence of xenogenic heart valve: Secondary | ICD-10-CM | POA: Diagnosis not present

## 2015-12-30 DIAGNOSIS — E785 Hyperlipidemia, unspecified: Secondary | ICD-10-CM | POA: Diagnosis not present

## 2015-12-30 DIAGNOSIS — I481 Persistent atrial fibrillation: Secondary | ICD-10-CM | POA: Diagnosis not present

## 2015-12-31 ENCOUNTER — Telehealth (HOSPITAL_COMMUNITY): Payer: Self-pay | Admitting: *Deleted

## 2015-12-31 NOTE — Telephone Encounter (Signed)
Received signed order for pt to participate in cardiac rehab. Message left for pt to please contact cardiac rehab for sign up. Contact information provided.  Cherre Huger, BSN

## 2016-01-03 DIAGNOSIS — I481 Persistent atrial fibrillation: Secondary | ICD-10-CM | POA: Diagnosis not present

## 2016-01-03 DIAGNOSIS — E785 Hyperlipidemia, unspecified: Secondary | ICD-10-CM | POA: Diagnosis not present

## 2016-01-03 DIAGNOSIS — Z953 Presence of xenogenic heart valve: Secondary | ICD-10-CM | POA: Diagnosis not present

## 2016-01-03 DIAGNOSIS — Z48812 Encounter for surgical aftercare following surgery on the circulatory system: Secondary | ICD-10-CM | POA: Diagnosis not present

## 2016-01-03 DIAGNOSIS — I472 Ventricular tachycardia: Secondary | ICD-10-CM | POA: Diagnosis not present

## 2016-01-03 DIAGNOSIS — I421 Obstructive hypertrophic cardiomyopathy: Secondary | ICD-10-CM | POA: Diagnosis not present

## 2016-01-06 DIAGNOSIS — Z953 Presence of xenogenic heart valve: Secondary | ICD-10-CM | POA: Diagnosis not present

## 2016-01-06 DIAGNOSIS — Z48812 Encounter for surgical aftercare following surgery on the circulatory system: Secondary | ICD-10-CM | POA: Diagnosis not present

## 2016-01-06 DIAGNOSIS — E785 Hyperlipidemia, unspecified: Secondary | ICD-10-CM | POA: Diagnosis not present

## 2016-01-06 DIAGNOSIS — I421 Obstructive hypertrophic cardiomyopathy: Secondary | ICD-10-CM | POA: Diagnosis not present

## 2016-01-06 DIAGNOSIS — I481 Persistent atrial fibrillation: Secondary | ICD-10-CM | POA: Diagnosis not present

## 2016-01-06 DIAGNOSIS — I472 Ventricular tachycardia: Secondary | ICD-10-CM | POA: Diagnosis not present

## 2016-01-18 ENCOUNTER — Encounter: Payer: Self-pay | Admitting: Cardiology

## 2016-01-18 ENCOUNTER — Encounter (HOSPITAL_COMMUNITY)
Admission: RE | Admit: 2016-01-18 | Discharge: 2016-01-18 | Disposition: A | Discharge status: Home / Self Care | Payer: Medicare Other | Source: Ambulatory Visit | Attending: Cardiology | Admitting: Cardiology

## 2016-01-18 ENCOUNTER — Encounter (HOSPITAL_COMMUNITY): Payer: Self-pay

## 2016-01-18 VITALS — BP 112/70 | HR 54 | Ht 60.0 in | Wt 172.4 lb

## 2016-01-18 DIAGNOSIS — Z8679 Personal history of other diseases of the circulatory system: Secondary | ICD-10-CM

## 2016-01-18 DIAGNOSIS — I5032 Chronic diastolic (congestive) heart failure: Secondary | ICD-10-CM | POA: Diagnosis not present

## 2016-01-18 DIAGNOSIS — Z9889 Other specified postprocedural states: Secondary | ICD-10-CM

## 2016-01-18 DIAGNOSIS — Z7901 Long term (current) use of anticoagulants: Secondary | ICD-10-CM | POA: Insufficient documentation

## 2016-01-18 DIAGNOSIS — Z87891 Personal history of nicotine dependence: Secondary | ICD-10-CM | POA: Diagnosis not present

## 2016-01-18 DIAGNOSIS — Z79899 Other long term (current) drug therapy: Secondary | ICD-10-CM | POA: Diagnosis not present

## 2016-01-18 DIAGNOSIS — Z952 Presence of prosthetic heart valve: Secondary | ICD-10-CM

## 2016-01-18 DIAGNOSIS — I421 Obstructive hypertrophic cardiomyopathy: Secondary | ICD-10-CM | POA: Diagnosis not present

## 2016-01-18 DIAGNOSIS — E7800 Pure hypercholesterolemia, unspecified: Secondary | ICD-10-CM | POA: Insufficient documentation

## 2016-01-18 DIAGNOSIS — I4891 Unspecified atrial fibrillation: Secondary | ICD-10-CM | POA: Insufficient documentation

## 2016-01-18 NOTE — Progress Notes (Signed)
Cardiac Individual Treatment Plan  Patient Details  Name: Melinda Gonzales MRN: [id number] Date of Birth: [birth year] Referring Provider:   Flowsheet Row CARDIAC REHAB PHASE II ORIENTATION from 01/18/2016 in [place]  Referring Provider  Peter, [place], MD      Initial Encounter Date:  [place] PHASE II ORIENTATION from 01/18/2016 in [place]  Date  01/18/16  Referring Provider  Peter, [place], MD      Visit Diagnosis: S/P MVR (mitral valve replacement)  S/P Maze operation for atrial fibrillation  Patient's Home Medications on Admission:  Current Outpatient Prescriptions:  .  acetaminophen (TYLENOL) 325 MG tablet, Take 650 mg by mouth every 6 (six) hours as needed., Disp: , Rfl:  .  amiodarone (PACERONE) 200 MG tablet, Take 1 tablet (200 mg total) by mouth daily., Disp: 90 tablet, Rfl: 3 .  furosemide (LASIX) 20 MG tablet, Take 20 mg by mouth daily. , Disp: , Rfl:  .  metoprolol tartrate (LOPRESSOR) 25 MG tablet, Take 1 tablet (25 mg total) by mouth every 8 (eight) hours., Disp: 270 tablet, Rfl: 3 .  pantoprazole (PROTONIX) 20 MG tablet, Take 1 tablet (20 mg total) by mouth daily., Disp: 90 tablet, Rfl: 3 .  Rivaroxaban (XARELTO) 15 MG TABS tablet, Take 1 tablet (15 mg total) by mouth 2 (two) times daily with a meal., Disp: 180 tablet, Rfl: 3 .  rosuvastatin (CRESTOR) 5 MG tablet, Take 1 tablet (5 mg total) by mouth daily., Disp: 90 tablet, Rfl: 3  Past Medical History: Past Medical History:  Diagnosis Date  . Chronic diastolic CHF (congestive heart failure) ([facility]) 07/06/2015  . Dyspnea   . Heart murmur   . Hypercholesterolemia   . Hypertrophic obstructive cardiomyopathy (HOCM) ([facility])   . LVH (left ventricular hypertrophy)    WITH NORMAL SYSTOLIC FUNCTION  . SOB (shortness of breath)     Tobacco Use: History  Smoking Status  . Former Smoker  . Quit date: 05/26/1965  Smokeless Tobacco  .  Never Used    Labs: Recent Review Flowsheet Data    Labs for ITP Cardiac and Pulmonary Rehab Latest Ref Rng & Units 03/09/2014 05/19/2015 07/06/2015 07/06/2015   Cholestrol 125 - 200 mg/dL 168 155 - -   LDLCALC <130 mg/dL 86 88 - -   HDL >=46 mg/dL 50.80 37(L) - -   Trlycerides <150 mg/dL 157.0(H) 151(H) - -   PHART 7.350 - 7.450 - - - 7.361   PCO2ART 35.0 - 45.0 mmHg - - - 29.7(L)   HCO3 20.0 - 24.0 mEq/L - - 18.6(L) 16.8(L)   TCO2 0 - 100 mmol/L - - 20 18   ACIDBASEDEF 0.0 - 2.0 mmol/L - - 7.0(H) 8.0(H)   O2SAT % - - 56.0 93.0      Capillary Blood Glucose: No results found for: GLUCAP   Exercise Target Goals: Date: 01/18/16  Exercise Program Goal: Individual exercise prescription set with THRR, safety & activity barriers. Participant demonstrates ability to understand and report RPE using BORG scale, to self-measure pulse accurately, and to acknowledge the importance of the exercise prescription.  Exercise Prescription Goal: Starting with aerobic activity 30 plus minutes a day, 3 days per week for initial exercise prescription. Provide home exercise prescription and guidelines that participant acknowledges understanding prior to discharge.  Activity Barriers & Risk Stratification:     Activity Barriers & Cardiac Risk Stratification - 01/18/16 1444      Activity Barriers &  Cardiac Risk Stratification   Activity Barriers Arthritis;Back Problems   Cardiac Risk Stratification High      6 Minute Walk:     6 Minute Walk    Row Name 01/18/16 1621         6 Minute Walk   Phase Initial     Distance 800 feet     Walk Time 6 minutes     # of Rest Breaks 0     MPH 1.5     METS 0.92     RPE 13     VO2 Peak 3.2     Symptoms Yes (comment)     Comments fatigue     Resting HR 54 bpm     Resting BP 112/70     Max Ex. HR 76 bpm     Max Ex. BP 140/76     2 Minute Post BP 146/60  recheck 118/68        Initial Exercise Prescription:     Initial Exercise Prescription -  01/18/16 1600      Date of Initial Exercise RX and Referring Provider   Date 01/18/16   Referring Provider Peter, [place], MD     Recumbant Bike   Level 1   Minutes 10   METs 1     NuStep   Level 1   Minutes 10   METs 1.3     Track   Laps 6   Minutes 10   METs 2.03     Prescription Details   Frequency (times per week) 3   Duration Progress to 30 minutes of continuous aerobic without signs/symptoms of physical distress     Intensity   THRR 40-80% of Max Heartrate 56-113   Ratings of Perceived Exertion 11-13   Perceived Dyspnea 0-4     Progression   Progression Continue progressive overload as per policy without signs/symptoms or physical distress.     Resistance Training   Training Prescription Yes   Weight 1lb   Reps 10-12      Perform Capillary Blood Glucose checks as needed.  Exercise Prescription Changes:   Exercise Comments:   Discharge Exercise Prescription (Final Exercise Prescription Changes):   Nutrition:  Target Goals: Understanding of nutrition guidelines, daily intake of sodium 1500mg , cholesterol 200mg , calories 30% from fat and 7% or less from saturated fats, daily to have 5 or more servings of fruits and vegetables.  Biometrics:     Pre Biometrics - 01/18/16 1618      Pre Biometrics   Height 5' (1.524 m)   Weight 172 lb 6.4 oz (78.2 kg)   Waist Circumference 36.75 inches   Hip Circumference 46.25 inches   Waist to Hip Ratio 0.79 %   BMI (Calculated) 33.7   Triceps Skinfold 40 mm   % Body Fat 46.3 %   Grip Strength 25 kg   Flexibility 12 in   Single Leg Stand 3.21 seconds       Nutrition Therapy Plan and Nutrition Goals:   Nutrition Discharge: Nutrition Scores:   Nutrition Goals Re-Evaluation:   Psychosocial: Target Goals: Acknowledge presence or absence of depression, maximize coping skills, provide positive support system. Participant is able to verbalize types and ability to use techniques and skills needed for  reducing stress and depression.  Initial Review & Psychosocial Screening:     Initial Psych Review & Screening - 01/18/16 [place]? Yes   Comments --  brief assessment reveals no further psychosocial assesment needed at this time      Quality of Life Scores:     Quality of Life - 01/18/16 1359      Quality of Life Scores   Health/Function Pre 25.2 %   Socioeconomic Pre 26.83 %   Psych/Spiritual Pre 26.07 %   Family Pre 27.6 %   GLOBAL Pre 26.05 %      PHQ-9: Recent Review Flowsheet Data    There is no flowsheet data to display.      Psychosocial Evaluation and Intervention:   Psychosocial Re-Evaluation:   Vocational Rehabilitation: Provide vocational rehab assistance to qualifying candidates.   Vocational Rehab Evaluation & Intervention:     Vocational Rehab - 01/18/16 1747      Initial Vocational Rehab Evaluation & Intervention   Assessment shows need for Vocational Rehabilitation No  Mrs Sawdon is retired and is not interested in vocational rehab at this time.      Education: Education Goals: Education classes will be provided on a weekly basis, covering required topics. Participant will state understanding/return demonstration of topics presented.  Learning Barriers/Preferences:     Learning Barriers/Preferences - 01/18/16 1352      Learning Barriers/Preferences   Learning Barriers Sight   Learning Preferences Pictoral;Computer/Internet      Education Topics: Count Your Pulse:  -Group instruction provided by verbal instruction, demonstration, patient participation and written materials to support subject.  Instructors address importance of being able to find your pulse and how to count your pulse when at home without a heart monitor.  Patients get hands on experience counting their pulse with staff help and individually.   Heart Attack, Angina, and Risk Factor Modification:  -Group instruction  provided by verbal instruction, video, and written materials to support subject.  Instructors address signs and symptoms of angina and heart attacks.    Also discuss risk factors for heart disease and how to make changes to improve heart health risk factors.   Functional Fitness:  -Group instruction provided by verbal instruction, demonstration, patient participation, and written materials to support subject.  Instructors address safety measures for doing things around the house.  Discuss how to get up and down off the floor, how to pick things up properly, how to safely get out of a chair without assistance, and balance training.   Meditation and Mindfulness:  -Group instruction provided by verbal instruction, patient participation, and written materials to support subject.  Instructor addresses importance of mindfulness and meditation practice to help reduce stress and improve awareness.  Instructor also leads participants through a meditation exercise.    Stretching for Flexibility and Mobility:  -Group instruction provided by verbal instruction, patient participation, and written materials to support subject.  Instructors lead participants through series of stretches that are designed to increase flexibility thus improving mobility.  These stretches are additional exercise for major muscle groups that are typically performed during regular warm up and cool down.   Hands Only CPR Anytime:  -Group instruction provided by verbal instruction, video, patient participation and written materials to support subject.  Instructors co-teach with AHA video for hands only CPR.  Participants get hands on experience with mannequins.   Nutrition I class: Heart Healthy Eating:  -Group instruction provided by PowerPoint slides, verbal discussion, and written materials to support subject matter. The instructor gives an explanation and review of the Therapeutic Lifestyle Changes diet recommendations, which  includes a discussion on lipid goals, dietary fat, sodium, fiber, plant stanol/sterol  esters, sugar, and the components of a well-balanced, healthy diet.   Nutrition II class: Lifestyle Skills:  -Group instruction provided by PowerPoint slides, verbal discussion, and written materials to support subject matter. The instructor gives an explanation and review of label reading, grocery shopping for heart health, heart healthy recipe modifications, and ways to make healthier choices when eating out.   Diabetes Question & Answer:  -Group instruction provided by PowerPoint slides, verbal discussion, and written materials to support subject matter. The instructor gives an explanation and review of diabetes co-morbidities, pre- and post-prandial blood glucose goals, pre-exercise blood glucose goals, signs, symptoms, and treatment of hypoglycemia and hyperglycemia, and foot care basics.   Diabetes Blitz:  -Group instruction provided by PowerPoint slides, verbal discussion, and written materials to support subject matter. The instructor gives an explanation and review of the physiology behind type 1 and type 2 diabetes, diabetes medications and rational behind using different medications, pre- and post-prandial blood glucose recommendations and Hemoglobin A1c goals, diabetes diet, and exercise including blood glucose guidelines for exercising safely.    Portion Distortion:  -Group instruction provided by PowerPoint slides, verbal discussion, written materials, and food models to support subject matter. The instructor gives an explanation of serving size versus portion size, changes in portions sizes over the last 20 years, and what consists of a serving from each food group.   Stress Management:  -Group instruction provided by verbal instruction, video, and written materials to support subject matter.  Instructors review role of stress in heart disease and how to cope with stress positively.      Exercising on Your Own:  -Group instruction provided by verbal instruction, power point, and written materials to support subject.  Instructors discuss benefits of exercise, components of exercise, frequency and intensity of exercise, and end points for exercise.  Also discuss use of nitroglycerin and activating EMS.  Review options of places to exercise outside of rehab.  Review guidelines for sex with heart disease.   Cardiac Drugs I:  -Group instruction provided by verbal instruction and written materials to support subject.  Instructor reviews cardiac drug classes: antiplatelets, anticoagulants, beta blockers, and statins.  Instructor discusses reasons, side effects, and lifestyle considerations for each drug class.   Cardiac Drugs II:  -Group instruction provided by verbal instruction and written materials to support subject.  Instructor reviews cardiac drug classes: angiotensin converting enzyme inhibitors (ACE-I), angiotensin II receptor blockers (ARBs), nitrates, and calcium channel blockers.  Instructor discusses reasons, side effects, and lifestyle considerations for each drug class.   Anatomy and Physiology of the Circulatory System:  -Group instruction provided by verbal instruction, video, and written materials to support subject.  Reviews functional anatomy of heart, how it relates to various diagnoses, and what role the heart plays in the overall system.   Knowledge Questionnaire Score:     Knowledge Questionnaire Score - 01/18/16 1359      Knowledge Questionnaire Score   Pre Score 20/24      Core Components/Risk Factors/Patient Goals at Admission:     Personal Goals and Risk Factors at Admission - 01/18/16 1352      Core Components/Risk Factors/Patient Goals on Admission   Sedentary Yes   Intervention Provide advice, education, support and counseling about physical activity/exercise needs.;Develop an individualized exercise prescription for aerobic and resistive  training based on initial evaluation findings, risk stratification, comorbidities and participant's personal goals.   Expected Outcomes Achievement of increased cardiorespiratory fitness and enhanced flexibility, muscular endurance and strength shown through  measurements of functional capacity and personal statement of participant.   Lipids Yes   Intervention Provide education and support for participant on nutrition & aerobic/resistive exercise along with prescribed medications to achieve LDL 70mg , HDL >40mg .   Expected Outcomes Short Term: Participant states understanding of desired cholesterol values and is compliant with medications prescribed. Participant is following exercise prescription and nutrition guidelines.;Long Term: Cholesterol controlled with medications as prescribed, with individualized exercise RX and with personalized nutrition plan. Value goals: LDL < 70mg , HDL > 40 mg.   Stress Yes   Intervention Offer individual and/or small group education and counseling on adjustment to heart disease, stress management and health-related lifestyle change. Teach and support self-help strategies.;Refer participants experiencing significant psychosocial distress to appropriate mental health specialists for further evaluation and treatment. When possible, include family members and significant others in education/counseling sessions.   Expected Outcomes Long Term: Emotional wellbeing is indicated by absence of clinically significant psychosocial distress or social isolation.;Short Term: Participant demonstrates changes in health-related behavior, relaxation and other stress management skills, ability to obtain effective social support, and compliance with psychotropic medications if prescribed.      Core Components/Risk Factors/Patient Goals Review:    Core Components/Risk Factors/Patient Goals at Discharge (Final Review):    ITP Comments:     ITP Comments    Row Name 01/18/16 1351            ITP Comments Dr. Fransico Him, Medical Director          Comments: Jenisa attended orientation from 1330 to 1515 to review rules and guidelines for program. Completed 6 minute walk test, Intitial ITP, and exercise prescription.  VSS.Tabbatha's only complaint was fatigue due  Otherwise exercise tolerated well.. Telemetry-Sinus Rhythm with a bundle branch block.  Asymptomatic.Today's ECG tracing faxed to Dr Doug Sou office for review. Dr Martinique reviewed today's ECG tracings. Order received to continue exercise.Will continue to monitor the patient throughout  the program.Maria Whitaker, RN,BSN 01/18/2016 6:00 PM

## 2016-01-18 NOTE — Progress Notes (Signed)
Cardiac Rehab Medication Review by a Pharmacist  Does the patient  feel that his/her medications are working for him/her?  yes  Has the patient been experiencing any side effects to the medications prescribed?  no  Does the patient measure his/her own blood pressure or blood glucose at home?  no   Does the patient have any problems obtaining medications due to transportation or finances?   no  Understanding of regimen: fair Understanding of indications: fair Potential of compliance: good    Pharmacist comments: Pt presents for initial cardiac rehab appointment. Pt denies monitoring her BP at home despite having a BP cuff - encouraged pt to begin monitoring BP regularly. No other issues identified.  Arrie Senate, PharmD PGY-1 Pharmacy Resident Pager: [phone] 01/18/2016

## 2016-01-26 ENCOUNTER — Encounter (HOSPITAL_COMMUNITY): Payer: Self-pay

## 2016-01-26 ENCOUNTER — Encounter (HOSPITAL_COMMUNITY)
Admission: RE | Admit: 2016-01-26 | Discharge: 2016-01-26 | Disposition: A | Discharge status: Home / Self Care | Payer: Medicare Other | Source: Ambulatory Visit | Attending: Cardiology | Admitting: Cardiology

## 2016-01-26 DIAGNOSIS — I5032 Chronic diastolic (congestive) heart failure: Secondary | ICD-10-CM | POA: Diagnosis not present

## 2016-01-26 DIAGNOSIS — Z79899 Other long term (current) drug therapy: Secondary | ICD-10-CM | POA: Diagnosis not present

## 2016-01-26 DIAGNOSIS — Z8679 Personal history of other diseases of the circulatory system: Secondary | ICD-10-CM

## 2016-01-26 DIAGNOSIS — E7800 Pure hypercholesterolemia, unspecified: Secondary | ICD-10-CM | POA: Diagnosis not present

## 2016-01-26 DIAGNOSIS — I4891 Unspecified atrial fibrillation: Secondary | ICD-10-CM | POA: Diagnosis not present

## 2016-01-26 DIAGNOSIS — Z952 Presence of prosthetic heart valve: Secondary | ICD-10-CM | POA: Diagnosis not present

## 2016-01-26 DIAGNOSIS — Z9889 Other specified postprocedural states: Secondary | ICD-10-CM

## 2016-01-26 DIAGNOSIS — Z7901 Long term (current) use of anticoagulants: Secondary | ICD-10-CM | POA: Diagnosis not present

## 2016-01-26 NOTE — Progress Notes (Signed)
Daily Session Note  Patient Details  Name: Melinda Gonzales MRN: [id number] Date of Birth: 05-Feb-1934 Referring Provider:   Flowsheet Row CARDIAC REHAB PHASE II ORIENTATION from 01/18/2016 in [place]  Referring Provider  Peter, [place], MD      Encounter Date: 01/26/2016  Check In:     Session Check In - 01/26/16 1000      Check-In   Location [facility]   Staff Present Cleda Mccreedy, MS, Exercise Physiologist;Carlette Wilber Oliphant, RN, BSN;Amber Fair, MS, ACSM RCEP, Exercise Physiologist;Joann Rion, RN, Marga Melnick, RN, BSN   Supervising physician immediately available to respond to emergencies [facility] immediately available   Physician(s) Dr. Wynetta Emery    Medication changes reported     No   Fall or balance concerns reported    No   Warm-up and Cool-down Performed as group-led instruction   Resistance Training Performed No   VAD Patient? No     Pain Assessment   Currently in Pain? No/denies      Capillary Blood Glucose: No results found for this or any previous visit (from the past 24 hour(s)).   Goals Met:  Exercise tolerated well  Goals Unmet:  Not Applicable  Comments: Pt started cardiac rehab today.  Pt tolerated light exercise without difficulty. VSS, telemetry-sinus rhythm, asymptomatic.  Medication list reconciled. Pt denies barriers to medicaiton compliance.  PSYCHOSOCIAL ASSESSMENT:  PHQ-0. Pt exhibits positive coping skills, hopeful outlook with supportive family. No psychosocial needs identified at this time, no psychosocial interventions necessary.    Pt enjoys going to church daily and housekeeping activities.   Pt goals for cardiac rehab are to lose weight and learn improved breathing techniques.  Pt instructed in pursed lip breathing and able to return demonstrate. Pt oriented to exercise equipment and routine.    Understanding verbalized.   Dr. Fransico Him is Medical Director for Cardiac  Rehab at [facility].

## 2016-01-28 ENCOUNTER — Encounter (HOSPITAL_COMMUNITY)
Admission: RE | Admit: 2016-01-28 | Discharge: 2016-01-28 | Disposition: A | Discharge status: Home / Self Care | Payer: Medicare Other | Source: Ambulatory Visit | Attending: Cardiology | Admitting: Cardiology

## 2016-01-28 DIAGNOSIS — Z952 Presence of prosthetic heart valve: Secondary | ICD-10-CM

## 2016-01-28 DIAGNOSIS — Z79899 Other long term (current) drug therapy: Secondary | ICD-10-CM | POA: Diagnosis not present

## 2016-01-28 DIAGNOSIS — I5032 Chronic diastolic (congestive) heart failure: Secondary | ICD-10-CM | POA: Diagnosis not present

## 2016-01-28 DIAGNOSIS — I4891 Unspecified atrial fibrillation: Secondary | ICD-10-CM | POA: Diagnosis not present

## 2016-01-28 DIAGNOSIS — Z9889 Other specified postprocedural states: Secondary | ICD-10-CM

## 2016-01-28 DIAGNOSIS — Z7901 Long term (current) use of anticoagulants: Secondary | ICD-10-CM | POA: Diagnosis not present

## 2016-01-28 DIAGNOSIS — E7800 Pure hypercholesterolemia, unspecified: Secondary | ICD-10-CM | POA: Diagnosis not present

## 2016-01-28 DIAGNOSIS — Z8679 Personal history of other diseases of the circulatory system: Secondary | ICD-10-CM

## 2016-02-01 DIAGNOSIS — H4601 Optic papillitis, right eye: Secondary | ICD-10-CM | POA: Diagnosis not present

## 2016-02-02 ENCOUNTER — Encounter (HOSPITAL_COMMUNITY)
Admission: RE | Admit: 2016-02-02 | Discharge: 2016-02-02 | Disposition: A | Discharge status: Home / Self Care | Payer: Medicare Other | Source: Ambulatory Visit | Attending: Cardiology | Admitting: Cardiology

## 2016-02-02 DIAGNOSIS — Z79899 Other long term (current) drug therapy: Secondary | ICD-10-CM | POA: Diagnosis not present

## 2016-02-02 DIAGNOSIS — Z87891 Personal history of nicotine dependence: Secondary | ICD-10-CM | POA: Diagnosis not present

## 2016-02-02 DIAGNOSIS — Z7901 Long term (current) use of anticoagulants: Secondary | ICD-10-CM | POA: Diagnosis not present

## 2016-02-02 DIAGNOSIS — Z8679 Personal history of other diseases of the circulatory system: Secondary | ICD-10-CM

## 2016-02-02 DIAGNOSIS — E7800 Pure hypercholesterolemia, unspecified: Secondary | ICD-10-CM | POA: Insufficient documentation

## 2016-02-02 DIAGNOSIS — Z952 Presence of prosthetic heart valve: Secondary | ICD-10-CM

## 2016-02-02 DIAGNOSIS — I5032 Chronic diastolic (congestive) heart failure: Secondary | ICD-10-CM | POA: Diagnosis not present

## 2016-02-02 DIAGNOSIS — I4891 Unspecified atrial fibrillation: Secondary | ICD-10-CM | POA: Insufficient documentation

## 2016-02-02 DIAGNOSIS — Z9889 Other specified postprocedural states: Secondary | ICD-10-CM

## 2016-02-02 DIAGNOSIS — I421 Obstructive hypertrophic cardiomyopathy: Secondary | ICD-10-CM | POA: Insufficient documentation

## 2016-02-02 NOTE — Progress Notes (Signed)
Cardiac Individual Treatment Plan  Patient Details  Name: Melinda Gonzales MRN: [id number] Date of Birth: [birth year] Referring Provider:   Flowsheet Row CARDIAC REHAB PHASE II ORIENTATION from 01/18/2016 in [facility]  Referring Provider  Peter, Martinique, MD      Initial Encounter Date:  Flowsheet Row CARDIAC REHAB PHASE II ORIENTATION from 01/18/2016 in [facility]  Date  01/18/16  Referring Provider  Peter, Martinique, MD      Visit Diagnosis: No diagnosis found.  Patient's Home Medications on Admission:  Current Outpatient Prescriptions:  .  acetaminophen (TYLENOL) 325 MG tablet, Take 650 mg by mouth every 6 (six) hours as needed., Disp: , Rfl:  .  amiodarone (PACERONE) 200 MG tablet, Take 1 tablet (200 mg total) by mouth daily., Disp: 90 tablet, Rfl: 3 .  furosemide (LASIX) 20 MG tablet, Take 20 mg by mouth daily. , Disp: , Rfl:  .  metoprolol tartrate (LOPRESSOR) 25 MG tablet, Take 1 tablet (25 mg total) by mouth every 8 (eight) hours., Disp: 270 tablet, Rfl: 3 .  pantoprazole (PROTONIX) 20 MG tablet, Take 1 tablet (20 mg total) by mouth daily., Disp: 90 tablet, Rfl: 3 .  Rivaroxaban (XARELTO) 15 MG TABS tablet, Take 1 tablet (15 mg total) by mouth 2 (two) times daily with a meal., Disp: 180 tablet, Rfl: 3 .  rosuvastatin (CRESTOR) 5 MG tablet, Take 1 tablet (5 mg total) by mouth daily., Disp: 90 tablet, Rfl: 3  Past Medical History: Past Medical History:  Diagnosis Date  . Chronic diastolic CHF (congestive heart failure) ([place]) 07/06/2015  . Dyspnea   . Heart murmur   . Hypercholesterolemia   . Hypertrophic obstructive cardiomyopathy (HOCM) ([place])   . LVH (left ventricular hypertrophy)    WITH NORMAL SYSTOLIC FUNCTION  . SOB (shortness of breath)     Tobacco Use: History  Smoking Status  . Former Smoker  . Quit date: 05/26/1965  Smokeless Tobacco  . Never Used    Labs: Recent Review Flowsheet Data    Labs  for ITP Cardiac and Pulmonary Rehab Latest Ref Rng & Units 03/09/2014 05/19/2015 07/06/2015 07/06/2015   Cholestrol 125 - 200 mg/dL 168 155 - -   LDLCALC <130 mg/dL 86 88 - -   HDL >=46 mg/dL 50.80 37(L) - -   Trlycerides <150 mg/dL 157.0(H) 151(H) - -   PHART 7.350 - 7.450 - - - 7.361   PCO2ART 35.0 - 45.0 mmHg - - - 29.7(L)   HCO3 20.0 - 24.0 mEq/L - - 18.6(L) 16.8(L)   TCO2 0 - 100 mmol/L - - 20 18   ACIDBASEDEF 0.0 - 2.0 mmol/L - - 7.0(H) 8.0(H)   O2SAT % - - 56.0 93.0      Capillary Blood Glucose: No results found for: GLUCAP   Exercise Target Goals:    Exercise Program Goal: Individual exercise prescription set with THRR, safety & activity barriers. Participant demonstrates ability to understand and report RPE using BORG scale, to self-measure pulse accurately, and to acknowledge the importance of the exercise prescription.  Exercise Prescription Goal: Starting with aerobic activity 30 plus minutes a day, 3 days per week for initial exercise prescription. Provide home exercise prescription and guidelines that participant acknowledges understanding prior to discharge.  Activity Barriers & Risk Stratification:     Activity Barriers & Cardiac Risk Stratification - 01/18/16 1444      Activity Barriers & Cardiac Risk Stratification   Activity Barriers Arthritis;Back Problems  Cardiac Risk Stratification High      6 Minute Walk:     6 Minute Walk    Row Name 01/18/16 1621         6 Minute Walk   Phase Initial     Distance 800 feet     Walk Time 6 minutes     # of Rest Breaks 0     MPH 1.5     METS 0.92     RPE 13     VO2 Peak 3.2     Symptoms Yes (comment)     Comments fatigue     Resting HR 54 bpm     Resting BP 112/70     Max Ex. HR 76 bpm     Max Ex. BP 140/76     2 Minute Post BP 146/60  recheck 118/68        Initial Exercise Prescription:     Initial Exercise Prescription - 01/18/16 1600      Date of Initial Exercise RX and Referring Provider    Date 01/18/16   Referring Provider Peter, Martinique, MD     Recumbant Bike   Level 1   Minutes 10   METs 1     NuStep   Level 1   Minutes 10   METs 1.3     Track   Laps 6   Minutes 10   METs 2.03     Prescription Details   Frequency (times per week) 3   Duration Progress to 30 minutes of continuous aerobic without signs/symptoms of physical distress     Intensity   THRR 40-80% of Max Heartrate 56-113   Ratings of Perceived Exertion 11-13   Perceived Dyspnea 0-4     Progression   Progression Continue progressive overload as per policy without signs/symptoms or physical distress.     Resistance Training   Training Prescription Yes   Weight 1lb   Reps 10-12      Perform Capillary Blood Glucose checks as needed.  Exercise Prescription Changes:     Exercise Prescription Changes    Row Name 02/01/16 1000             Exercise Review   Progression Yes         Response to Exercise   Blood Pressure (Admit) 114/70       Blood Pressure (Exercise) 142/80       Blood Pressure (Exit) 132/62       Heart Rate (Admit) 67 bpm       Heart Rate (Exercise) 81 bpm       Heart Rate (Exit) 60 bpm       Rating of Perceived Exertion (Exercise) 13       Symptoms none       Duration Progress to 30 minutes of continuous aerobic without signs/symptoms of physical distress       Intensity THRR unchanged         Progression   Progression Continue progressive overload as per policy without signs/symptoms or physical distress.       Average METs 2.1         Resistance Training   Training Prescription Yes       Weight 1lb       Reps 10-12         NuStep   Level 2       Minutes 20       METs 2  Track   Laps 7       Minutes 10       METs 2.23          Exercise Comments:     Exercise Comments    Row Name 02/01/16 1041 02/01/16 1046         Exercise Comments There are no changes to current Ex. Rx will continue to monitor exercise progression There are no  changes to current Ex. Rx will continue to monitor exercise progression.          Discharge Exercise Prescription (Final Exercise Prescription Changes):     Exercise Prescription Changes - 02/01/16 1000      Exercise Review   Progression Yes     Response to Exercise   Blood Pressure (Admit) 114/70   Blood Pressure (Exercise) 142/80   Blood Pressure (Exit) 132/62   Heart Rate (Admit) 67 bpm   Heart Rate (Exercise) 81 bpm   Heart Rate (Exit) 60 bpm   Rating of Perceived Exertion (Exercise) 13   Symptoms none   Duration Progress to 30 minutes of continuous aerobic without signs/symptoms of physical distress   Intensity THRR unchanged     Progression   Progression Continue progressive overload as per policy without signs/symptoms or physical distress.   Average METs 2.1     Resistance Training   Training Prescription Yes   Weight 1lb   Reps 10-12     NuStep   Level 2   Minutes 20   METs 2     Track   Laps 7   Minutes 10   METs 2.23      Nutrition:  Target Goals: Understanding of nutrition guidelines, daily intake of sodium <1538m, cholesterol <2094m calories 30% from fat and 7% or less from saturated fats, daily to have 5 or more servings of fruits and vegetables.  Biometrics:     Pre Biometrics - 01/18/16 1618      Pre Biometrics   Height 5' (1.524 m)   Weight 172 lb 6.4 oz (78.2 kg)   Waist Circumference 36.75 inches   Hip Circumference 46.25 inches   Waist to Hip Ratio 0.79 %   BMI (Calculated) 33.7   Triceps Skinfold 40 mm   % Body Fat 46.3 %   Grip Strength 25 kg   Flexibility 12 in   Single Leg Stand 3.21 seconds       Nutrition Therapy Plan and Nutrition Goals:   Nutrition Discharge: Nutrition Scores:   Nutrition Goals Re-Evaluation:   Psychosocial: Target Goals: Acknowledge presence or absence of depression, maximize coping skills, provide positive support system. Participant is able to verbalize types and ability to use techniques  and skills needed for reducing stress and depression.  Initial Review & Psychosocial Screening:     Initial Psych Review & Screening - 01/18/16 [place]   Comments --  brief assessment reveals no further psychosocial assesment needed at this time      Quality of Life Scores:     Quality of Life - 01/26/16 1148      Quality of Life Scores   Health/Function Pre --  dyspnea and fatigue are pt biggest concerns   GLOBAL Pre --  overall scores are satisfactory.  will continue to monitor      PHQ-9: Recent Review Flowsheet Data    [facility] 01/[facility]/2016   Decreased Interest 0   Down, Depressed,  Hopeless 0   PHQ - 2 Score 0      Psychosocial Evaluation and Intervention:     Psychosocial Evaluation - 01/26/16 1145      Psychosocial Evaluation & Interventions   Interventions Encouraged to exercise with the program and follow exercise prescription;Relaxation education   Comments no psychosocial needs identified, no intervention necessary    Continued Psychosocial Services Needed No      Psychosocial Re-Evaluation:     Psychosocial Re-Evaluation    [place] Name 02/01/16 0818             Psychosocial Re-Evaluation   Interventions Encouraged to attend Cardiac Rehabilitation for the exercise       Comments no psychosocial needs identified, no interventions necessary        Continued Psychosocial Services Needed No          Vocational Rehabilitation: Provide vocational rehab assistance to qualifying candidates.   Vocational Rehab Evaluation & Intervention:     Vocational Rehab - 01/18/16 1747      Initial Vocational Rehab Evaluation & Intervention   Assessment shows need for Vocational Rehabilitation No  Mrs Molinari is retired and is not interested in vocational rehab at this time.      Education: Education Goals: Education classes will be provided on a weekly basis, covering required topics.  Participant will state understanding/return demonstration of topics presented.  Learning Barriers/Preferences:     Learning Barriers/Preferences - 01/18/16 1352      Learning Barriers/Preferences   Learning Barriers Sight   Learning Preferences Pictoral;Computer/Internet      Education Topics: Count Your Pulse:  -Group instruction provided by verbal instruction, demonstration, patient participation and written materials to support subject.  Instructors address importance of being able to find your pulse and how to count your pulse when at home without a heart monitor.  Patients get hands on experience counting their pulse with staff help and individually.   Heart Attack, Angina, and Risk Factor Modification:  -Group instruction provided by verbal instruction, video, and written materials to support subject.  Instructors address signs and symptoms of angina and heart attacks.    Also discuss risk factors for heart disease and how to make changes to improve heart health risk factors.   Functional Fitness:  -Group instruction provided by verbal instruction, demonstration, patient participation, and written materials to support subject.  Instructors address safety measures for doing things around the house.  Discuss how to get up and down off the floor, how to pick things up properly, how to safely get out of a chair without assistance, and balance training.   Meditation and Mindfulness:  -Group instruction provided by verbal instruction, patient participation, and written materials to support subject.  Instructor addresses importance of mindfulness and meditation practice to help reduce stress and improve awareness.  Instructor also leads participants through a meditation exercise.  Flowsheet Row CARDIAC REHAB PHASE II EXERCISE from 01/26/2016 in [facility]  Date  01/26/16  Instruction Review Code  2- meets goals/outcomes      Stretching for Flexibility  and Mobility:  -Group instruction provided by verbal instruction, patient participation, and written materials to support subject.  Instructors lead participants through series of stretches that are designed to increase flexibility thus improving mobility.  These stretches are additional exercise for major muscle groups that are typically performed during regular warm up and cool down.   Hands Only CPR Anytime:  -Group instruction provided by verbal instruction, video, patient participation  and written materials to support subject.  Instructors co-teach with AHA video for hands only CPR.  Participants get hands on experience with mannequins.   Nutrition I class: Heart Healthy Eating:  -Group instruction provided by PowerPoint slides, verbal discussion, and written materials to support subject matter. The instructor gives an explanation and review of the Therapeutic Lifestyle Changes diet recommendations, which includes a discussion on lipid goals, dietary fat, sodium, fiber, plant stanol/sterol esters, sugar, and the components of a well-balanced, healthy diet.   Nutrition II class: Lifestyle Skills:  -Group instruction provided by PowerPoint slides, verbal discussion, and written materials to support subject matter. The instructor gives an explanation and review of label reading, grocery shopping for heart health, heart healthy recipe modifications, and ways to make healthier choices when eating out.   Diabetes Question & Answer:  -Group instruction provided by PowerPoint slides, verbal discussion, and written materials to support subject matter. The instructor gives an explanation and review of diabetes co-morbidities, pre- and post-prandial blood glucose goals, pre-exercise blood glucose goals, signs, symptoms, and treatment of hypoglycemia and hyperglycemia, and foot care basics.   Diabetes Blitz:  -Group instruction provided by PowerPoint slides, verbal discussion, and written materials to  support subject matter. The instructor gives an explanation and review of the physiology behind type 1 and type 2 diabetes, diabetes medications and rational behind using different medications, pre- and post-prandial blood glucose recommendations and Hemoglobin A1c goals, diabetes diet, and exercise including blood glucose guidelines for exercising safely.    Portion Distortion:  -Group instruction provided by PowerPoint slides, verbal discussion, written materials, and food models to support subject matter. The instructor gives an explanation of serving size versus portion size, changes in portions sizes over the last 20 years, and what consists of a serving from each food group.   Stress Management:  -Group instruction provided by verbal instruction, video, and written materials to support subject matter.  Instructors review role of stress in heart disease and how to cope with stress positively.     Exercising on Your Own:  -Group instruction provided by verbal instruction, power point, and written materials to support subject.  Instructors discuss benefits of exercise, components of exercise, frequency and intensity of exercise, and end points for exercise.  Also discuss use of nitroglycerin and activating EMS.  Review options of places to exercise outside of rehab.  Review guidelines for sex with heart disease.   Cardiac Drugs I:  -Group instruction provided by verbal instruction and written materials to support subject.  Instructor reviews cardiac drug classes: antiplatelets, anticoagulants, beta blockers, and statins.  Instructor discusses reasons, side effects, and lifestyle considerations for each drug class.   Cardiac Drugs II:  -Group instruction provided by verbal instruction and written materials to support subject.  Instructor reviews cardiac drug classes: angiotensin converting enzyme inhibitors (ACE-I), angiotensin II receptor blockers (ARBs), nitrates, and calcium channel  blockers.  Instructor discusses reasons, side effects, and lifestyle considerations for each drug class.   Anatomy and Physiology of the Circulatory System:  -Group instruction provided by verbal instruction, video, and written materials to support subject.  Reviews functional anatomy of heart, how it relates to various diagnoses, and what role the heart plays in the overall system.   Knowledge Questionnaire Score:     Knowledge Questionnaire Score - 01/18/16 1359      Knowledge Questionnaire Score   Pre Score 20/24      Core Components/Risk Factors/Patient Goals at Admission:     Personal Goals  and Risk Factors at Admission - 01/18/16 1352      Core Components/Risk Factors/Patient Goals on Admission   Sedentary Yes   Intervention Provide advice, education, support and counseling about physical activity/exercise needs.;Develop an individualized exercise prescription for aerobic and resistive training based on initial evaluation findings, risk stratification, comorbidities and participant's personal goals.   Expected Outcomes Achievement of increased cardiorespiratory fitness and enhanced flexibility, muscular endurance and strength shown through measurements of functional capacity and personal statement of participant.   Lipids Yes   Intervention Provide education and support for participant on nutrition & aerobic/resistive exercise along with prescribed medications to achieve LDL <55m, HDL >426m   Expected Outcomes Short Term: Participant states understanding of desired cholesterol values and is compliant with medications prescribed. Participant is following exercise prescription and nutrition guidelines.;Long Term: Cholesterol controlled with medications as prescribed, with individualized exercise RX and with personalized nutrition plan. Value goals: LDL < 7068mHDL > 40 mg.   Stress Yes   Intervention Offer individual and/or small group education and counseling on adjustment to  heart disease, stress management and health-related lifestyle change. Teach and support self-help strategies.;Refer participants experiencing significant psychosocial distress to appropriate mental health specialists for further evaluation and treatment. When possible, include family members and significant others in education/counseling sessions.   Expected Outcomes Long Term: Emotional wellbeing is indicated by absence of clinically significant psychosocial distress or social isolation.;Short Term: Participant demonstrates changes in health-related behavior, relaxation and other stress management skills, ability to obtain effective social support, and compliance with psychotropic medications if prescribed.      Core Components/Risk Factors/Patient Goals Review:    Core Components/Risk Factors/Patient Goals at Discharge (Final Review):    ITP Comments:     ITP Comments    Row Name 01/18/16 1351 01/28/16 0826         ITP Comments Dr. TraFransico Himedical Director Attended CPR education class, Met outcomes/goals          Comments: Pt is making expected progress toward personal goals after completing 3 sessions. Recommend continued exercise and life style modification education including  stress management and relaxation techniques to decrease cardiac risk profile.

## 2016-02-03 ENCOUNTER — Ambulatory Visit (HOSPITAL_COMMUNITY): Payer: Medicare Other | Attending: Cardiovascular Disease

## 2016-02-03 ENCOUNTER — Other Ambulatory Visit: Payer: Self-pay

## 2016-02-03 ENCOUNTER — Encounter: Payer: Self-pay | Admitting: Cardiology

## 2016-02-03 ENCOUNTER — Other Ambulatory Visit: Payer: Medicare Other | Admitting: *Deleted

## 2016-02-03 ENCOUNTER — Other Ambulatory Visit: Payer: Self-pay | Admitting: Cardiology

## 2016-02-03 DIAGNOSIS — Z952 Presence of prosthetic heart valve: Secondary | ICD-10-CM

## 2016-02-03 DIAGNOSIS — I480 Paroxysmal atrial fibrillation: Secondary | ICD-10-CM

## 2016-02-03 DIAGNOSIS — E669 Obesity, unspecified: Secondary | ICD-10-CM | POA: Diagnosis not present

## 2016-02-03 DIAGNOSIS — Z953 Presence of xenogenic heart valve: Secondary | ICD-10-CM | POA: Diagnosis not present

## 2016-02-03 DIAGNOSIS — I421 Obstructive hypertrophic cardiomyopathy: Secondary | ICD-10-CM | POA: Diagnosis not present

## 2016-02-03 DIAGNOSIS — I4891 Unspecified atrial fibrillation: Secondary | ICD-10-CM | POA: Diagnosis not present

## 2016-02-03 DIAGNOSIS — I5032 Chronic diastolic (congestive) heart failure: Secondary | ICD-10-CM | POA: Diagnosis not present

## 2016-02-03 DIAGNOSIS — I472 Ventricular tachycardia: Secondary | ICD-10-CM

## 2016-02-03 DIAGNOSIS — I4729 Other ventricular tachycardia: Secondary | ICD-10-CM

## 2016-02-03 DIAGNOSIS — I082 Rheumatic disorders of both aortic and tricuspid valves: Secondary | ICD-10-CM | POA: Diagnosis not present

## 2016-02-03 DIAGNOSIS — E785 Hyperlipidemia, unspecified: Secondary | ICD-10-CM | POA: Insufficient documentation

## 2016-02-03 DIAGNOSIS — Z87891 Personal history of nicotine dependence: Secondary | ICD-10-CM | POA: Diagnosis not present

## 2016-02-04 ENCOUNTER — Encounter: Payer: Self-pay | Admitting: Cardiology

## 2016-02-04 ENCOUNTER — Encounter (HOSPITAL_COMMUNITY)
Admission: RE | Admit: 2016-02-04 | Discharge: 2016-02-04 | Disposition: A | Discharge status: Home / Self Care | Payer: Medicare Other | Source: Ambulatory Visit | Attending: Cardiology | Admitting: Cardiology

## 2016-02-04 DIAGNOSIS — E7800 Pure hypercholesterolemia, unspecified: Secondary | ICD-10-CM | POA: Diagnosis not present

## 2016-02-04 DIAGNOSIS — Z7901 Long term (current) use of anticoagulants: Secondary | ICD-10-CM | POA: Diagnosis not present

## 2016-02-04 DIAGNOSIS — Z952 Presence of prosthetic heart valve: Secondary | ICD-10-CM | POA: Diagnosis not present

## 2016-02-04 DIAGNOSIS — I5032 Chronic diastolic (congestive) heart failure: Secondary | ICD-10-CM | POA: Diagnosis not present

## 2016-02-04 DIAGNOSIS — Z8679 Personal history of other diseases of the circulatory system: Secondary | ICD-10-CM

## 2016-02-04 DIAGNOSIS — I4891 Unspecified atrial fibrillation: Secondary | ICD-10-CM | POA: Diagnosis not present

## 2016-02-04 DIAGNOSIS — Z9889 Other specified postprocedural states: Secondary | ICD-10-CM

## 2016-02-04 DIAGNOSIS — Z79899 Other long term (current) drug therapy: Secondary | ICD-10-CM | POA: Diagnosis not present

## 2016-02-04 LAB — CBC WITH DIFFERENTIAL/PLATELET
BASOS ABS: 0 10*3/uL (ref 0.0–0.2)
Basos: 1 %
EOS (ABSOLUTE): 0.8 10*3/uL — AB (ref 0.0–0.4)
Eos: 12 %
HEMATOCRIT: 38.3 % (ref 34.0–46.6)
Hemoglobin: 12.1 g/dL (ref 11.1–15.9)
Immature Grans (Abs): 0 10*3/uL (ref 0.0–0.1)
Immature Granulocytes: 0 %
LYMPHS ABS: 1.5 10*3/uL (ref 0.7–3.1)
Lymphs: 21 %
MCH: 29.2 pg (ref 26.6–33.0)
MCHC: 31.6 g/dL (ref 31.5–35.7)
MCV: 92 fL (ref 79–97)
MONOS ABS: 0.6 10*3/uL (ref 0.1–0.9)
Monocytes: 9 %
NEUTROS PCT: 57 %
Neutrophils Absolute: 4 10*3/uL (ref 1.4–7.0)
PLATELETS: 354 10*3/uL (ref 150–379)
RBC: 4.15 x10E6/uL (ref 3.77–5.28)
RDW: 15.5 % — AB (ref 12.3–15.4)
WBC: 7.1 10*3/uL (ref 3.4–10.8)

## 2016-02-04 LAB — LIPID PANEL W/O CHOL/HDL RATIO
Cholesterol, Total: 191 mg/dL (ref 100–199)
HDL: 46 mg/dL (ref >39)
LDL Calculated: 113 mg/dL — ABNORMAL HIGH (ref 0–99)
Triglycerides: 158 mg/dL — ABNORMAL HIGH (ref 0–149)
VLDL Cholesterol Cal: 32 mg/dL (ref 5–40)

## 2016-02-04 LAB — BASIC METABOLIC PANEL
BUN/Creatinine Ratio: 17 (ref 12–28)
BUN: 25 mg/dL (ref 8–27)
CHLORIDE: 103 mmol/L (ref 96–106)
CO2: 23 mmol/L (ref 18–29)
Calcium: 9.3 mg/dL (ref 8.7–10.3)
Creatinine, Ser: 1.47 mg/dL — ABNORMAL HIGH (ref 0.57–1.00)
GFR calc Af Amer: 39 mL/min/{1.73_m2} — ABNORMAL LOW (ref >59)
GFR, EST NON AFRICAN AMERICAN: 34 mL/min/{1.73_m2} — AB (ref >59)
Glucose: 112 mg/dL — ABNORMAL HIGH (ref 65–99)
POTASSIUM: 4.6 mmol/L (ref 3.5–5.2)
SODIUM: 143 mmol/L (ref 134–144)

## 2016-02-04 LAB — AMBIG ABBREV BMP8 DEFAULT

## 2016-02-04 LAB — AMBIG ABBREV LP DEFAULT

## 2016-02-07 ENCOUNTER — Encounter (HOSPITAL_COMMUNITY)
Admission: RE | Admit: 2016-02-07 | Discharge: 2016-02-07 | Disposition: A | Discharge status: Home / Self Care | Payer: Medicare Other | Source: Ambulatory Visit | Attending: Cardiology | Admitting: Cardiology

## 2016-02-07 ENCOUNTER — Telehealth (HOSPITAL_COMMUNITY): Payer: Self-pay | Admitting: Cardiac Rehabilitation

## 2016-02-07 DIAGNOSIS — Z8679 Personal history of other diseases of the circulatory system: Secondary | ICD-10-CM

## 2016-02-07 DIAGNOSIS — I4891 Unspecified atrial fibrillation: Secondary | ICD-10-CM | POA: Diagnosis not present

## 2016-02-07 DIAGNOSIS — Z952 Presence of prosthetic heart valve: Secondary | ICD-10-CM

## 2016-02-07 DIAGNOSIS — E7800 Pure hypercholesterolemia, unspecified: Secondary | ICD-10-CM | POA: Diagnosis not present

## 2016-02-07 DIAGNOSIS — I5032 Chronic diastolic (congestive) heart failure: Secondary | ICD-10-CM | POA: Diagnosis not present

## 2016-02-07 DIAGNOSIS — Z79899 Other long term (current) drug therapy: Secondary | ICD-10-CM | POA: Diagnosis not present

## 2016-02-07 DIAGNOSIS — Z9889 Other specified postprocedural states: Secondary | ICD-10-CM

## 2016-02-07 DIAGNOSIS — Z7901 Long term (current) use of anticoagulants: Secondary | ICD-10-CM | POA: Diagnosis not present

## 2016-02-07 NOTE — Telephone Encounter (Signed)
-----   Message from Peter M Martinique, MD sent at 02/06/2016  2:08 PM EST ----- Regarding: RE: cardiac rehab  Clarion let me know if this persist and we may stop amiodarone sooner than I planned.  Peter Martinique MD, [facility]  ----- Message ----- From: Lowell Guitar, RN Sent: 02/04/2016  12:05 PM To: Peter M Martinique, MD Subject: cardiac rehab                                  Dear Dr. Martinique,  [place] was bradycardic at cardiac rehab today.  Resting pre-exercise HR-46, exercise rates -55-62, post exercise resting HR- 55.  BP:  128/80 (rest) 142/80 (peak exercise).  Sinus rhythm.  Pt asymptomatic, denies change in regimen.  Usual HR-60-89.  Will continue to monitor.   I'll fax rehab reports and rhythm strips for your review.    Please advise.  Thank you, Andi Hence, RN, BSN Cardiac Pulmonary Rehab

## 2016-02-08 ENCOUNTER — Other Ambulatory Visit: Payer: Self-pay

## 2016-02-08 NOTE — Progress Notes (Signed)
Melinda Gonzales Date of Birth: October 27, 1934   History of Present Illness: [name] is seen for follow up of HOCM  She was evaluated in 2011 with a Myoview stress test which was normal. She had an Echo at that time that showed moderate LVH with EF 65-70%. Mild MR. She does have a history of murmur. She is not a smoker. In early 2016 she presented with increased dyspnea and an Echo and cardiac MRI were done. These with both consistent with HOCM. LVOT gradient of 112 mm Hg at rest. She was started on low dose lasix and Toprol XL with good response initially with improved dyspnea.  Seen last year with symptoms of increased dyspnea. No increased edema or orthopnea but more dyspnea with walking or lifting. No chest pain. No dizziness or palpitations. Energy level has decreased. Repeat Echo showed severe LVOT gradient and MR.  She underwent cardiac cath with findings of LVOT gradient and MR. No significant CAD. Pulmonary HTN. She was referred to the [facility] clinic and underwent surgery on 11/05/15. Prior to surgery she developed Afib and had a DCCV. Surgery  included a septal myectomy, MVR with a #29 mm St. Jude  Biocor valve, left sided Cryo MAZE, and ligation of the left atrial appendage. Her post op course was remarkable for paroxysmal atrial fibrillation and she was started on amiodarone and Xarelto. At DC Hgb was 9.0. Potassium 3.1, creatinine 1.16. Ecg showed a LBBB. Repeat Echo on 11/11/15 showed normal LV function with EF 60%. Mean MV gradient 8 mm Hg. Trace MR. Mild TR. There was a fixed LVOT gradient of 32 mm Hg peak and mean of 16 mm Hg. Repeat Echo 02/03/16 showed no LVOT gradient and normal MV prosthesis function.   On follow up today she is doing well. Her energy level has improved. She is participating in [place].   No chest pain. No palpitations or tachycardia. Only gets SOB if she is really hurrying.  She has developed loss of sight in right eye. Scheduled to see retinal  specialist next week. Last week labs indicated increase in creatinine so lasix was held.   Current Outpatient Prescriptions on File Prior to Visit  Medication Sig Dispense Refill  . acetaminophen (TYLENOL) 325 MG tablet Take 650 mg by mouth every 6 (six) hours as needed.    . pantoprazole (PROTONIX) 20 MG tablet Take 1 tablet (20 mg total) by mouth daily. 90 tablet 3  . rosuvastatin (CRESTOR) 5 MG tablet Take 1 tablet (5 mg total) by mouth daily. 90 tablet 3  . furosemide (LASIX) 20 MG tablet Take 20 mg daily if needed for increased swelling 90 tablet 3   No current facility-administered medications on file prior to visit.     Allergies  Allergen Reactions  . Oxycodone Nausea And Vomiting    And the sweats    Past Medical History:  Diagnosis Date  . Chronic diastolic CHF (congestive heart failure) ([place]) 07/06/2015  . Dyspnea   . Heart murmur   . Hypercholesterolemia   . Hypertrophic obstructive cardiomyopathy (HOCM) ([place])   . LVH (left ventricular hypertrophy)    WITH NORMAL SYSTOLIC FUNCTION  . SOB (shortness of breath)     Past Surgical History:  Procedure Laterality Date  . CARDIAC CATHETERIZATION N/A 07/06/2015   Procedure: Right/Left Heart Cath and Coronary Angiography;  Surgeon: Zaidyn Claire M Martinique, MD;  Location: [facility];  Service: Cardiovascular;  Laterality: N/A;  . MITRAL VALVE REPLACEMENT N/A 11/15/2015  ligation of left atrial appendage, maze procedure at [facility]    History  Smoking Status  . Former Smoker  . Quit date: 05/26/1965  Smokeless Tobacco  . Never Used    History  Alcohol Use No    Family History  Problem Relation Age of Onset  . Heart failure Mother 4  . Heart attack Father 30    Review of Systems: As noted in HPI.   All other systems were reviewed and are negative.  Physical Exam: BP 126/74   Pulse 63   Ht 5\' 1"  (1.549 m)   Wt 176 lb (79.8 kg)   SpO2 94%   BMI 33.25 kg/m  She is an obese white female in no  distress. Her HEENT exam is unremarkable. She has no JVD or bruits. Lungs are clear. Cardiac exam reveals a soft 2/6  systolic murmur at the  right upper sternal border radiating to the apex. There is no S3. Sternotomy site is healing well.Abdomen is soft and nontender. She has tr edema. Pedal pulses are excellent.  Neurologic exam is nonfocal. Body mass index is 33.25 kg/m.   LABORATORY DATA:   Lab Results  Component Value Date   WBC 7.1 02/03/2016   HGB 10.3 (L) 11/19/2015   HCT 38.3 02/03/2016   PLT 354 02/03/2016   GLUCOSE 112 (H) 02/03/2016   CHOL 191 02/03/2016   TRIG 158 (H) 02/03/2016   HDL 46 02/03/2016   LDLCALC 113 (H) 02/03/2016   ALT 14 11/19/2015   AST 19 11/19/2015   NA 143 02/03/2016   K 4.6 02/03/2016   CL 103 02/03/2016   CREATININE 1.47 (H) 02/03/2016   BUN 25 02/03/2016   CO2 23 02/03/2016   INR 1.03 07/06/2015    Echo 02/03/16: Study Conclusions  - Left ventricle: The cavity size was normal. There was moderate   concentric hypertrophy. Systolic function was normal. The   estimated ejection fraction was in the range of 60% to 65%. Wall   motion was normal; there were no regional wall motion   abnormalities. The study is not technically sufficient to allow   evaluation of LV diastolic function due to the presence of a   bioprosthetic mitral valve. - Aortic valve: Valve mobility was mildly restricted. There was   mild stenosis. There was mild regurgitation. - Mitral valve: Mildly calcified annulus. A 63mm St. Jude biocor   bioprosthesis was present and functioning well. - Left atrium: The atrium was severely dilated. - Right ventricle: The cavity size was normal. Wall thickness was   normal. Systolic function was normal. - Tricuspid valve: There was mild regurgitation. - Pulmonary arteries: Systolic pressure was mildly increased. PA   peak pressure: 42 mm Hg (S).  Impressions:  - Since the last echo 06/15/2015, a bioprosthetic mitral valve is    present and functioning well. Septal wall thickness is now 1.5 cm   compared with 1.8 cm pre-operatively.  Assessment / Plan: 1. HOCM. Classic morphologic features by Echo and MRI. MRI also shows some subendocardial scar consistent with HCM.  Initially improved on increased doses of Toprol and lasix but symptoms persisted.  Echo showed increased LVOT gradient on medication and more SAM and MR. She had evidence of pulmonary HTN- confirmed on cardiac cath with elevated LV filling pressures. Now s/p septal myectomy on 11/05/15. Good result by follow up Echo. No residual LVOT gradient. Recovering nicely. Continue Cardiac Rehab.  2. Severe MR s/p MVR with #29 St. Jude Biocor valve.  Routine SBE prophylaxis. Normal function by Echo.  3.  NSVT Symptoms resolved on beta blocker.    4. Paroxysmal Atrial fibrillation post op open heart surgery. S/p left sided MAZE. Maintaining NSR. Will stop amiodarone at this time. Switch metoprolol to Toprol XL 50 mg daily. She has not been on appropriate Xarelto dose so will switch to 20 mg daily.    5. Hypercholesterolemia.   6. Post op anemia. Improved.  7. Acute on chronic kidney disease stage 3. Creatinine recently increased 1.2>>1.47. Lasix held. Will repeat BMET in 2 weeks.   8. Vision loss right eye. Follow up with Dr. Zigmund Daniel.

## 2016-02-09 ENCOUNTER — Encounter (HOSPITAL_COMMUNITY)
Admission: RE | Admit: 2016-02-09 | Discharge: 2016-02-09 | Disposition: A | Discharge status: Home / Self Care | Payer: Medicare Other | Source: Ambulatory Visit | Attending: Cardiology | Admitting: Cardiology

## 2016-02-09 DIAGNOSIS — Z952 Presence of prosthetic heart valve: Secondary | ICD-10-CM

## 2016-02-09 DIAGNOSIS — Z8679 Personal history of other diseases of the circulatory system: Secondary | ICD-10-CM

## 2016-02-09 DIAGNOSIS — I5032 Chronic diastolic (congestive) heart failure: Secondary | ICD-10-CM | POA: Diagnosis not present

## 2016-02-09 DIAGNOSIS — Z79899 Other long term (current) drug therapy: Secondary | ICD-10-CM | POA: Diagnosis not present

## 2016-02-09 DIAGNOSIS — Z7901 Long term (current) use of anticoagulants: Secondary | ICD-10-CM | POA: Diagnosis not present

## 2016-02-09 DIAGNOSIS — E7800 Pure hypercholesterolemia, unspecified: Secondary | ICD-10-CM | POA: Diagnosis not present

## 2016-02-09 DIAGNOSIS — I4891 Unspecified atrial fibrillation: Secondary | ICD-10-CM | POA: Diagnosis not present

## 2016-02-09 DIAGNOSIS — Z9889 Other specified postprocedural states: Secondary | ICD-10-CM

## 2016-02-10 ENCOUNTER — Ambulatory Visit (INDEPENDENT_AMBULATORY_CARE_PROVIDER_SITE_OTHER): Payer: Medicare Other | Admitting: Cardiology

## 2016-02-10 ENCOUNTER — Telehealth: Payer: Self-pay | Admitting: Cardiology

## 2016-02-10 ENCOUNTER — Encounter: Payer: Self-pay | Admitting: Cardiology

## 2016-02-10 VITALS — BP 126/74 | HR 63 | Ht 61.0 in | Wt 176.0 lb

## 2016-02-10 DIAGNOSIS — Z952 Presence of prosthetic heart valve: Secondary | ICD-10-CM | POA: Diagnosis not present

## 2016-02-10 DIAGNOSIS — I421 Obstructive hypertrophic cardiomyopathy: Secondary | ICD-10-CM

## 2016-02-10 DIAGNOSIS — I340 Nonrheumatic mitral (valve) insufficiency: Secondary | ICD-10-CM

## 2016-02-10 DIAGNOSIS — I5032 Chronic diastolic (congestive) heart failure: Secondary | ICD-10-CM | POA: Diagnosis not present

## 2016-02-10 DIAGNOSIS — I480 Paroxysmal atrial fibrillation: Secondary | ICD-10-CM

## 2016-02-10 MED ORDER — RIVAROXABAN 20 MG PO TABS: 20.0000 mg | ORAL_TABLET | Freq: Every day | ORAL | 3 refills | Status: DC

## 2016-02-10 MED ORDER — METOPROLOL SUCCINATE ER 50 MG PO TB24: 50.0000 mg | ORAL_TABLET | Freq: Every day | ORAL | 3 refills | Status: DC

## 2016-02-10 NOTE — Patient Instructions (Signed)
Stop amiodarone  Stop metoprolol tartrate. We will start Toprol XL 50 mg daily  Change Xarelto to 20 mg once a day  We will check your kidney function again in a couple of weeks  I will see you in 3 months.

## 2016-02-10 NOTE — Telephone Encounter (Signed)
Returned Melinda Gonzales's call.  Patient is s/p MVR. Advised standard SBE prophylaxis applies - recommendation of amoxicillin 2g w no documented penicillin allergy. Also no need to interrupt xarelto for cleaning. Caller voiced understanding and thanks.

## 2016-02-10 NOTE — Telephone Encounter (Signed)
New message       1. What dental office are you calling from?  Dr Michell Heinrich  2. What is your office phone and fax number? Fax [phone]  What type of procedure is the patient having performed?  cleaning What date is procedure scheduled?  In chair now What is your question (ex. Antibiotics prior to procedure, holding medication-we need to know how long dentist wants pt to hold med)?  Ok to have cleaning or will pt need an antibiotic?  Pt was seen today by Dr Martinique

## 2016-02-11 ENCOUNTER — Encounter (HOSPITAL_COMMUNITY)
Admission: RE | Admit: 2016-02-11 | Discharge: 2016-02-11 | Disposition: A | Discharge status: Home / Self Care | Payer: Medicare Other | Source: Ambulatory Visit | Attending: Cardiology | Admitting: Cardiology

## 2016-02-11 DIAGNOSIS — Z9889 Other specified postprocedural states: Secondary | ICD-10-CM

## 2016-02-11 DIAGNOSIS — Z8679 Personal history of other diseases of the circulatory system: Secondary | ICD-10-CM

## 2016-02-11 DIAGNOSIS — Z952 Presence of prosthetic heart valve: Secondary | ICD-10-CM

## 2016-02-11 DIAGNOSIS — I5032 Chronic diastolic (congestive) heart failure: Secondary | ICD-10-CM | POA: Diagnosis not present

## 2016-02-11 DIAGNOSIS — Z79899 Other long term (current) drug therapy: Secondary | ICD-10-CM | POA: Diagnosis not present

## 2016-02-11 DIAGNOSIS — I4891 Unspecified atrial fibrillation: Secondary | ICD-10-CM | POA: Diagnosis not present

## 2016-02-11 DIAGNOSIS — Z7901 Long term (current) use of anticoagulants: Secondary | ICD-10-CM | POA: Diagnosis not present

## 2016-02-11 DIAGNOSIS — E7800 Pure hypercholesterolemia, unspecified: Secondary | ICD-10-CM | POA: Diagnosis not present

## 2016-02-11 NOTE — Progress Notes (Signed)
Reviewed home exercise guidelines with patient including endpoints, temperature precautions, target heart rate and rate of perceived exertion. Pt plans to walk as her mode of home exercise. Pt voices understanding of instructions given. Tristine Langi M Xylia Scherger, MS, ACSM CEP  

## 2016-02-14 ENCOUNTER — Encounter (HOSPITAL_COMMUNITY): Payer: Medicare Other

## 2016-02-16 ENCOUNTER — Encounter (HOSPITAL_COMMUNITY): Payer: Medicare Other

## 2016-02-17 ENCOUNTER — Telehealth: Payer: Self-pay | Admitting: Cardiology

## 2016-02-17 ENCOUNTER — Encounter (INDEPENDENT_AMBULATORY_CARE_PROVIDER_SITE_OTHER): Payer: Self-pay | Admitting: Ophthalmology

## 2016-02-17 ENCOUNTER — Encounter (INDEPENDENT_AMBULATORY_CARE_PROVIDER_SITE_OTHER): Payer: Medicare Other | Admitting: Ophthalmology

## 2016-02-17 DIAGNOSIS — H43813 Vitreous degeneration, bilateral: Secondary | ICD-10-CM | POA: Diagnosis not present

## 2016-02-17 DIAGNOSIS — H4601 Optic papillitis, right eye: Secondary | ICD-10-CM

## 2016-02-17 DIAGNOSIS — H2513 Age-related nuclear cataract, bilateral: Secondary | ICD-10-CM | POA: Diagnosis not present

## 2016-02-17 DIAGNOSIS — H353132 Nonexudative age-related macular degeneration, bilateral, intermediate dry stage: Secondary | ICD-10-CM | POA: Diagnosis not present

## 2016-02-17 NOTE — Telephone Encounter (Signed)
Dr Tempie Hoist called concerned that Ms Bethea had optic nerve swelling in one eye. She is on Amiodarone. He did not think her optic nerve swelling was from Amiodarone but was concerned that the pt may go blind if she developed any problems in her other eye secondary to Amiodarone. He spoke with Dr Stanford Breed who suggested she stop the Amiodarone and f/u with Dr Martinique- this will be arranged.   Kerin Ransom PA-C 02/17/2016 2:46 PM

## 2016-02-18 ENCOUNTER — Encounter (HOSPITAL_COMMUNITY): Payer: Medicare Other

## 2016-02-18 DIAGNOSIS — H4601 Optic papillitis, right eye: Secondary | ICD-10-CM | POA: Diagnosis not present

## 2016-02-21 ENCOUNTER — Telehealth (HOSPITAL_COMMUNITY): Payer: Self-pay | Admitting: Cardiac Rehabilitation

## 2016-02-21 ENCOUNTER — Encounter (HOSPITAL_COMMUNITY)
Admission: RE | Admit: 2016-02-21 | Discharge: 2016-02-21 | Disposition: A | Discharge status: Home / Self Care | Payer: Medicare Other | Source: Ambulatory Visit | Attending: Cardiology | Admitting: Cardiology

## 2016-02-21 ENCOUNTER — Telehealth: Payer: Self-pay | Admitting: Cardiovascular Disease

## 2016-02-21 DIAGNOSIS — Z952 Presence of prosthetic heart valve: Secondary | ICD-10-CM | POA: Diagnosis not present

## 2016-02-21 DIAGNOSIS — Z7901 Long term (current) use of anticoagulants: Secondary | ICD-10-CM | POA: Diagnosis not present

## 2016-02-21 DIAGNOSIS — E7800 Pure hypercholesterolemia, unspecified: Secondary | ICD-10-CM | POA: Diagnosis not present

## 2016-02-21 DIAGNOSIS — Z8679 Personal history of other diseases of the circulatory system: Secondary | ICD-10-CM

## 2016-02-21 DIAGNOSIS — Z9889 Other specified postprocedural states: Secondary | ICD-10-CM

## 2016-02-21 DIAGNOSIS — I4891 Unspecified atrial fibrillation: Secondary | ICD-10-CM | POA: Diagnosis not present

## 2016-02-21 DIAGNOSIS — I5032 Chronic diastolic (congestive) heart failure: Secondary | ICD-10-CM | POA: Diagnosis not present

## 2016-02-21 DIAGNOSIS — Z79899 Other long term (current) drug therapy: Secondary | ICD-10-CM | POA: Diagnosis not present

## 2016-02-21 NOTE — Telephone Encounter (Signed)
New message  Pt call requesting to speak with RN. Pt states she will discuss with RN. Please call back

## 2016-02-21 NOTE — Telephone Encounter (Signed)
Returned call to patient.she stated she wanted to ask Dr.Jordan if ok to take Prednisone 20 mg twice a day prescribed by Dr.John Zigmund Daniel retina specialist at [place].Stated she first saw optometrist Dr.Gould for blurred vision in right eye that she has had for a few months,before her heart surgery.Stated he told her she has swelling in optic nerve and macular edema.He referred her to Dr.Matthews.Dr.Matthews did several test, she got a call today about lab work he ordered and wanted her to start taking prednisone.Stated he has referred her to [place] at [facility] is waiting on appointment.Advised Dr.Jordan not in office this afternoon.Advised to start taking prednisone as prescribed.I will send message to [place].

## 2016-02-21 NOTE — Telephone Encounter (Signed)
She is OK to take prednisone.  Azora Bonzo Martinique MD, [facility]

## 2016-02-21 NOTE — Telephone Encounter (Signed)
-----   Message from Peter M Martinique, MD sent at 02/21/2016  3:54 PM EST ----- Regarding: RE: cardiac rehab  I really do not know if there are any restrictions for this. Maybe you could check with Dr. Zigmund Daniel office.  Thanks  Collier Salina ----- Message ----- From: Lowell Guitar, RN Sent: 02/21/2016   8:30 AM To: Peter M Martinique, MD Subject: cardiac rehab                                  Dear Dr. Martinique,  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  Dr. Zigmund Daniel, retina specialist has referred her to [facility], she is awaiting this appointment.  Are there any exercise/activity restrictions for her at cardiac rehab until further evaluation?  Thank you, Andi Hence, RN, BSN Cardiac Pulmonary Rehab

## 2016-02-21 NOTE — Telephone Encounter (Signed)
Returned call to patient Dr.Jordan's recommendations given. 

## 2016-02-22 ENCOUNTER — Telehealth (HOSPITAL_COMMUNITY): Payer: Self-pay | Admitting: Cardiac Rehabilitation

## 2016-02-22 NOTE — Telephone Encounter (Signed)
Pc to Dr. Zigmund Daniel to discuss pt optic nerve inflammation.  Per Dr. Zigmund Daniel, pt has temporal artritis with CRP-8. Dr. Zigmund Daniel reports he has prescribed  Prednisone 20mg  two tabs daily.  His recommendation for exercise is to closely monitor BP, avoid hypertension, and avoid overheating with exertion.  He advised low level activity, however no other restrictions at this time.     Pt has been referred by Dr. Zigmund Daniel to Dr. Sanda Klein, [facility] for optic neuro evaluation.  Dr.Matthews expressed concern that patient has not responded to voicemail  referral call placed by Dr. Earlie Server office with medical advice to go to [facility] for expedited treatment.  Pt denies receiving this information.   Per Dr. Zigmund Daniel nurse, Dr. Sanda Klein phone number [phone] given to pt to contact them.Pt verbalized understanding and states she will call today for earliest appointment and recommendations.

## 2016-02-23 ENCOUNTER — Other Ambulatory Visit: Payer: Medicare Other

## 2016-02-23 ENCOUNTER — Encounter (HOSPITAL_COMMUNITY)
Admission: RE | Admit: 2016-02-23 | Discharge: 2016-02-23 | Disposition: A | Discharge status: Home / Self Care | Payer: Medicare Other | Source: Ambulatory Visit | Attending: Cardiology | Admitting: Cardiology

## 2016-02-23 ENCOUNTER — Telehealth: Payer: Self-pay | Admitting: Cardiology

## 2016-02-23 DIAGNOSIS — Z7901 Long term (current) use of anticoagulants: Secondary | ICD-10-CM | POA: Diagnosis not present

## 2016-02-23 DIAGNOSIS — Z952 Presence of prosthetic heart valve: Secondary | ICD-10-CM | POA: Diagnosis not present

## 2016-02-23 DIAGNOSIS — I4891 Unspecified atrial fibrillation: Secondary | ICD-10-CM | POA: Diagnosis not present

## 2016-02-23 DIAGNOSIS — Z8679 Personal history of other diseases of the circulatory system: Secondary | ICD-10-CM

## 2016-02-23 DIAGNOSIS — Z9889 Other specified postprocedural states: Secondary | ICD-10-CM

## 2016-02-23 DIAGNOSIS — E7800 Pure hypercholesterolemia, unspecified: Secondary | ICD-10-CM | POA: Diagnosis not present

## 2016-02-23 DIAGNOSIS — I5032 Chronic diastolic (congestive) heart failure: Secondary | ICD-10-CM | POA: Diagnosis not present

## 2016-02-23 DIAGNOSIS — Z79899 Other long term (current) drug therapy: Secondary | ICD-10-CM | POA: Diagnosis not present

## 2016-02-23 NOTE — Telephone Encounter (Signed)
Closed Encounter  °

## 2016-02-23 NOTE — Telephone Encounter (Signed)
New Message   Patient forgot about appt, and wants to know if it is ok for her to still do her blood work since she has eaten today or does she need to reschedule.

## 2016-02-23 NOTE — Telephone Encounter (Signed)
Spoke to patient, rescheduled her lab draw for tomorrow at [facility]. She's aware to fast for this test.

## 2016-02-24 ENCOUNTER — Other Ambulatory Visit: Payer: Medicare Other | Admitting: *Deleted

## 2016-02-24 DIAGNOSIS — I480 Paroxysmal atrial fibrillation: Secondary | ICD-10-CM | POA: Diagnosis not present

## 2016-02-24 DIAGNOSIS — Z952 Presence of prosthetic heart valve: Secondary | ICD-10-CM

## 2016-02-24 DIAGNOSIS — I421 Obstructive hypertrophic cardiomyopathy: Secondary | ICD-10-CM

## 2016-02-24 DIAGNOSIS — R0602 Shortness of breath: Secondary | ICD-10-CM

## 2016-02-24 DIAGNOSIS — I517 Cardiomegaly: Secondary | ICD-10-CM

## 2016-02-24 DIAGNOSIS — I340 Nonrheumatic mitral (valve) insufficiency: Secondary | ICD-10-CM

## 2016-02-24 DIAGNOSIS — E7800 Pure hypercholesterolemia, unspecified: Secondary | ICD-10-CM | POA: Diagnosis not present

## 2016-02-24 DIAGNOSIS — I5032 Chronic diastolic (congestive) heart failure: Secondary | ICD-10-CM

## 2016-02-25 ENCOUNTER — Encounter (HOSPITAL_COMMUNITY)
Admission: RE | Admit: 2016-02-25 | Discharge: 2016-02-25 | Disposition: A | Discharge status: Home / Self Care | Payer: Medicare Other | Source: Ambulatory Visit | Attending: Cardiology | Admitting: Cardiology

## 2016-02-25 DIAGNOSIS — Z9889 Other specified postprocedural states: Secondary | ICD-10-CM

## 2016-02-25 DIAGNOSIS — Z952 Presence of prosthetic heart valve: Secondary | ICD-10-CM

## 2016-02-25 DIAGNOSIS — E7800 Pure hypercholesterolemia, unspecified: Secondary | ICD-10-CM | POA: Diagnosis not present

## 2016-02-25 DIAGNOSIS — I5032 Chronic diastolic (congestive) heart failure: Secondary | ICD-10-CM | POA: Diagnosis not present

## 2016-02-25 DIAGNOSIS — Z7901 Long term (current) use of anticoagulants: Secondary | ICD-10-CM | POA: Diagnosis not present

## 2016-02-25 DIAGNOSIS — Z79899 Other long term (current) drug therapy: Secondary | ICD-10-CM | POA: Diagnosis not present

## 2016-02-25 DIAGNOSIS — Z8679 Personal history of other diseases of the circulatory system: Secondary | ICD-10-CM

## 2016-02-25 DIAGNOSIS — I4891 Unspecified atrial fibrillation: Secondary | ICD-10-CM | POA: Diagnosis not present

## 2016-02-25 LAB — LIPID PANEL
CHOL/HDL RATIO: 3.4 ratio (ref 0.0–4.4)
Cholesterol, Total: 196 mg/dL (ref 100–199)
HDL: 58 mg/dL (ref >39)
LDL CALC: 115 mg/dL — AB (ref 0–99)
Triglycerides: 115 mg/dL (ref 0–149)
VLDL CHOLESTEROL CAL: 23 mg/dL (ref 5–40)

## 2016-02-25 LAB — BASIC METABOLIC PANEL
BUN/Creatinine Ratio: 19 (ref 12–28)
BUN: 26 mg/dL (ref 8–27)
CALCIUM: 9.8 mg/dL (ref 8.7–10.3)
CO2: 21 mmol/L (ref 18–29)
Chloride: 104 mmol/L (ref 96–106)
Creatinine, Ser: 1.37 mg/dL — ABNORMAL HIGH (ref 0.57–1.00)
GFR, EST AFRICAN AMERICAN: 42 mL/min/{1.73_m2} — AB (ref >59)
GFR, EST NON AFRICAN AMERICAN: 37 mL/min/{1.73_m2} — AB (ref >59)
Glucose: 117 mg/dL — ABNORMAL HIGH (ref 65–99)
POTASSIUM: 5.6 mmol/L — AB (ref 3.5–5.2)
Sodium: 145 mmol/L — ABNORMAL HIGH (ref 134–144)

## 2016-02-28 ENCOUNTER — Encounter (HOSPITAL_COMMUNITY)
Admission: RE | Admit: 2016-02-28 | Discharge: 2016-02-28 | Disposition: A | Discharge status: Home / Self Care | Payer: Medicare Other | Source: Ambulatory Visit

## 2016-02-28 DIAGNOSIS — H47291 Other optic atrophy, right eye: Secondary | ICD-10-CM | POA: Diagnosis not present

## 2016-02-28 DIAGNOSIS — H47011 Ischemic optic neuropathy, right eye: Secondary | ICD-10-CM | POA: Diagnosis not present

## 2016-02-28 DIAGNOSIS — H3552 Pigmentary retinal dystrophy: Secondary | ICD-10-CM | POA: Diagnosis not present

## 2016-02-28 DIAGNOSIS — H3530 Unspecified macular degeneration: Secondary | ICD-10-CM | POA: Diagnosis not present

## 2016-02-28 DIAGNOSIS — Z87891 Personal history of nicotine dependence: Secondary | ICD-10-CM | POA: Diagnosis not present

## 2016-02-28 DIAGNOSIS — H4722 Hereditary optic atrophy: Secondary | ICD-10-CM | POA: Diagnosis not present

## 2016-02-29 ENCOUNTER — Telehealth: Payer: Self-pay | Admitting: Cardiology

## 2016-02-29 NOTE — Telephone Encounter (Signed)
Patient aware Melinda Gonzales Mood out of office today, routed as requested.

## 2016-02-29 NOTE — Telephone Encounter (Signed)
New message  Pt call requesting to speak with RN. Pt states she would like to speak with Malachy Mood and is willing to wait until she returns for a call back. Please call back to discuss

## 2016-03-01 ENCOUNTER — Encounter (HOSPITAL_COMMUNITY): Admission: RE | Admit: 2016-03-01 | Payer: Medicare Other | Source: Ambulatory Visit

## 2016-03-02 ENCOUNTER — Ambulatory Visit: Payer: Medicare Other | Admitting: Student

## 2016-03-02 ENCOUNTER — Encounter (HOSPITAL_COMMUNITY): Payer: Self-pay | Admitting: Emergency Medicine

## 2016-03-02 ENCOUNTER — Ambulatory Visit (INDEPENDENT_AMBULATORY_CARE_PROVIDER_SITE_OTHER): Payer: Medicare Other

## 2016-03-02 ENCOUNTER — Ambulatory Visit (HOSPITAL_COMMUNITY)
Admission: EM | Admit: 2016-03-02 | Discharge: 2016-03-02 | Disposition: A | Discharge status: Home / Self Care | Payer: Medicare Other | Attending: Emergency Medicine | Admitting: Emergency Medicine

## 2016-03-02 DIAGNOSIS — J40 Bronchitis, not specified as acute or chronic: Secondary | ICD-10-CM | POA: Diagnosis not present

## 2016-03-02 DIAGNOSIS — R05 Cough: Secondary | ICD-10-CM

## 2016-03-02 DIAGNOSIS — R059 Cough, unspecified: Secondary | ICD-10-CM

## 2016-03-02 DIAGNOSIS — R0602 Shortness of breath: Secondary | ICD-10-CM | POA: Diagnosis not present

## 2016-03-02 MED ORDER — ALBUTEROL SULFATE (2.5 MG/3ML) 0.083% IN NEBU
2.5000 mg | INHALATION_SOLUTION | Freq: Once | RESPIRATORY_TRACT | Status: AC
  Administered 2016-03-02: 2.5 mg via RESPIRATORY_TRACT

## 2016-03-02 MED ORDER — SODIUM CHLORIDE 0.9 % IN NEBU
INHALATION_SOLUTION | RESPIRATORY_TRACT | Status: AC
  Filled 2016-03-02: qty 3

## 2016-03-02 MED ORDER — BENZONATATE 100 MG PO CAPS: 100.0000 mg | ORAL_CAPSULE | Freq: Three times a day (TID) | ORAL | 0 refills | Status: DC

## 2016-03-02 MED ORDER — ALBUTEROL SULFATE HFA 108 (90 BASE) MCG/ACT IN AERS: 1.0000 | INHALATION_SPRAY | Freq: Four times a day (QID) | RESPIRATORY_TRACT | 0 refills | Status: DC | PRN

## 2016-03-02 MED ORDER — AZITHROMYCIN 250 MG PO TABS: 250.0000 mg | ORAL_TABLET | Freq: Every day | ORAL | 0 refills | Status: DC

## 2016-03-02 MED ORDER — ALBUTEROL SULFATE (2.5 MG/3ML) 0.083% IN NEBU
INHALATION_SOLUTION | RESPIRATORY_TRACT | Status: AC
  Filled 2016-03-02: qty 3

## 2016-03-02 NOTE — Telephone Encounter (Signed)
Returned call to patient she stated her chest is congested,frequent non productive cough,wheezing.Stated her PCP and PA out of office today.Stated she feels bad.No fever.Advised to go to Urgent Care.

## 2016-03-02 NOTE — ED Provider Notes (Signed)
CSN: [id number]     Arrival date & time 03/02/16  1242 History   First MD Initiated Contact with Patient 03/02/16 1420     Chief Complaint  Patient presents with  . URI   (Consider location/radiation/quality/duration/timing/severity/associated sxs/prior Treatment) Patient c/o cough and congestion for 5 days.   The history is provided by the patient.  URI  Presenting symptoms: cough   Severity:  Moderate Onset quality:  Sudden Duration:  5 days Timing:  Constant Progression:  Unchanged Chronicity:  New Relieved by:  None tried Worsened by:  Nothing Ineffective treatments:  None tried Associated symptoms: wheezing     Past Medical History:  Diagnosis Date  . Chronic diastolic CHF (congestive heart failure) ([place]) 07/06/2015  . Dyspnea   . Heart murmur   . Hypercholesterolemia   . Hypertrophic obstructive cardiomyopathy (HOCM) ([place])   . LVH (left ventricular hypertrophy)    WITH NORMAL SYSTOLIC FUNCTION  . SOB (shortness of breath)    Past Surgical History:  Procedure Laterality Date  . CARDIAC CATHETERIZATION N/A 07/06/2015   Procedure: Right/Left Heart Cath and Coronary Angiography;  Surgeon: Peter M Martinique, MD;  Location: [facility];  Service: Cardiovascular;  Laterality: N/A;  . MITRAL VALVE REPLACEMENT N/A 11/15/2015   ligation of left atrial appendage, maze procedure at [facility] History  Problem Relation Age of Onset  . Heart failure Mother 69  . Heart attack Father 81   Social History  Substance Use Topics  . Smoking status: Former Smoker    Quit date: 05/26/1965  . Smokeless tobacco: Never Used  . Alcohol use No   OB History    No data available     Review of Systems  Constitutional: Negative.   HENT: Negative.   Eyes: Negative.   Respiratory: Positive for cough and wheezing.   Cardiovascular: Negative.   Gastrointestinal: Negative.   Endocrine: Negative.   Genitourinary: Negative.   Musculoskeletal: Negative.    Neurological: Negative.   Hematological: Negative.   Psychiatric/Behavioral: Negative.     Allergies  Oxycodone  Home Medications   Prior to Admission medications   Medication Sig Start Date End Date Taking? Authorizing Provider  metoprolol succinate (TOPROL-XL) 50 MG 24 hr tablet Take 1 tablet (50 mg total) by mouth daily. Take with or immediately following a meal. 02/10/16 02/04/17 Yes Peter M Martinique, MD  Multiple Vitamins-Minerals (PRESERVISION/LUTEIN PO) Take by mouth.   Yes Historical Provider, MD  pantoprazole (PROTONIX) 20 MG tablet Take 1 tablet (20 mg total) by mouth daily. 11/19/15  Yes Peter M Martinique, MD  predniSONE (DELTASONE) 20 MG tablet Take 40 mg by mouth daily with breakfast.   Yes Historical Provider, MD  rivaroxaban (XARELTO) 20 MG TABS tablet Take 1 tablet (20 mg total) by mouth daily with supper. 02/10/16  Yes Peter M Martinique, MD  rosuvastatin (CRESTOR) 5 MG tablet Take 1 tablet (5 mg total) by mouth daily. 11/19/15  Yes Peter M Martinique, MD  acetaminophen (TYLENOL) 325 MG tablet Take 650 mg by mouth every 6 (six) hours as needed.    Historical Provider, MD  albuterol (PROVENTIL HFA;VENTOLIN HFA) 108 (90 Base) MCG/ACT inhaler Inhale 1-2 puffs into the lungs every 6 (six) hours as needed for wheezing or shortness of breath. 03/02/16   Lysbeth Penner, FNP  azithromycin (ZITHROMAX) 250 MG tablet Take 1 tablet (250 mg total) by mouth daily. Take first 2 tablets together, then 1 every day until finished. 03/02/16   Orson Ape  Seaton Hofmann, FNP  benzonatate (TESSALON) 100 MG capsule Take 1 capsule (100 mg total) by mouth every 8 (eight) hours. 03/02/16   Lysbeth Penner, FNP  furosemide (LASIX) 20 MG tablet Take 20 mg daily if needed for increased swelling 02/08/16   Peter M Martinique, MD   Meds Ordered and Administered this Visit   Medications  albuterol (PROVENTIL) (2.5 MG/3ML) 0.083% nebulizer solution 2.5 mg (not administered)    BP 161/91 (BP Location: Right Arm)   Pulse 71   Temp  98.9 F (37.2 C) (Oral)   Resp 24   SpO2 94%  No data found.   Physical Exam  Constitutional: She appears well-developed and well-nourished.  HENT:  Head: Normocephalic.  Right Ear: External ear normal.  Left Ear: External ear normal.  Mouth/Throat: Oropharynx is clear and moist.  Eyes: Conjunctivae and EOM are normal. Pupils are equal, round, and reactive to light.  Neck: Normal range of motion. Neck supple.  Cardiovascular: Normal rate, regular rhythm and normal heart sounds.   Pulmonary/Chest: Effort normal. She has wheezes.  Abdominal: Soft. Bowel sounds are normal.  Nursing note and vitals reviewed.   Urgent Care Course     Procedures (including critical care time)  Labs Review Labs Reviewed - No data to display  Imaging Review Dg Chest 2 View  Result Date: 03/02/2016 CLINICAL DATA:  Shortness of breath and cough EXAM: CHEST  2 VIEW COMPARISON:  11/19/2015 FINDINGS: Cardiac shadow is stable. Postsurgical changes are again noted. Previously seen effusions have resolved in the interval. Hyperinflation consistent with COPD is noted. A few scattered granulomas are again seen. No acute bony abnormality is noted. IMPRESSION: COPD without acute abnormality. Electronically Signed   By: Inez Catalina M.D.   On: 03/02/2016 14:47     Visual Acuity Review  Right Eye Distance:   Left Eye Distance:   Bilateral Distance:    Right Eye Near:   Left Eye Near:    Bilateral Near:         MDM   1. Bronchitis   2. Cough    ZPak as directed Tessalon Perles Albuterol Neb treatment Albuterol MDI 2 puffs qid prn  Push po fluids, rest, tylenol and motrin otc prn as directed for fever, arthralgias, and myalgias.  Follow up prn if sx's continue or persist.    Lysbeth Penner, FNP 03/02/16 [phone]

## 2016-03-02 NOTE — Telephone Encounter (Signed)
Follow up    Wants Malachy Mood to give her a call back, will not say what its regarding

## 2016-03-02 NOTE — ED Triage Notes (Signed)
Pt c/o cold sx onset: 5 days  Sx include: prod cough, SOB, nasal drainage, wheezing  Reports open heart surgery on 10/2015... Going to [employer] [facility]  Denies: fevers  A&O x4... NAD

## 2016-03-03 ENCOUNTER — Encounter (HOSPITAL_COMMUNITY): Payer: Medicare Other

## 2016-03-03 NOTE — Progress Notes (Signed)
Cardiac Individual Treatment Plan  Patient Details  Name: Melinda Gonzales MRN: [id number] Date of Birth: 1934/06/13 Referring Provider:   Flowsheet Row CARDIAC REHAB PHASE II ORIENTATION from 01/18/2016 in [facility]  Referring Provider  Peter, [place], MD      Initial Encounter Date:  [place] PHASE II ORIENTATION from 01/18/2016 in [facility]  Date  01/18/16  Referring Provider  Peter, [place], MD      Visit Diagnosis: S/P MVR (mitral valve replacement)  S/P Maze operation for atrial fibrillation  Patient's Home Medications on Admission:  Current Outpatient Prescriptions:  .  acetaminophen (TYLENOL) 325 MG tablet, Take 650 mg by mouth every 6 (six) hours as needed., Disp: , Rfl:  .  albuterol (PROVENTIL HFA;VENTOLIN HFA) 108 (90 Base) MCG/ACT inhaler, Inhale 1-2 puffs into the lungs every 6 (six) hours as needed for wheezing or shortness of breath., Disp: 1 Inhaler, Rfl: 0 .  azithromycin (ZITHROMAX) 250 MG tablet, Take 1 tablet (250 mg total) by mouth daily. Take first 2 tablets together, then 1 every day until finished., Disp: 6 tablet, Rfl: 0 .  benzonatate (TESSALON) 100 MG capsule, Take 1 capsule (100 mg total) by mouth every 8 (eight) hours., Disp: 21 capsule, Rfl: 0 .  furosemide (LASIX) 20 MG tablet, Take 20 mg daily if needed for increased swelling, Disp: 90 tablet, Rfl: 3 .  metoprolol succinate (TOPROL-XL) 50 MG 24 hr tablet, Take 1 tablet (50 mg total) by mouth daily. Take with or immediately following a meal., Disp: 90 tablet, Rfl: 3 .  Multiple Vitamins-Minerals (PRESERVISION/LUTEIN PO), Take by mouth., Disp: , Rfl:  .  pantoprazole (PROTONIX) 20 MG tablet, Take 1 tablet (20 mg total) by mouth daily., Disp: 90 tablet, Rfl: 3 .  predniSONE (DELTASONE) 20 MG tablet, Take 40 mg by mouth daily with breakfast., Disp: , Rfl:  .  rivaroxaban (XARELTO) 20 MG TABS tablet, Take 1 tablet (20 mg  total) by mouth daily with supper., Disp: 90 tablet, Rfl: 3 .  rosuvastatin (CRESTOR) 5 MG tablet, Take 1 tablet (5 mg total) by mouth daily., Disp: 90 tablet, Rfl: 3  Past Medical History: Past Medical History:  Diagnosis Date  . Chronic diastolic CHF (congestive heart failure) ([place]) 07/06/2015  . Dyspnea   . Heart murmur   . Hypercholesterolemia   . Hypertrophic obstructive cardiomyopathy (HOCM) ([place])   . LVH (left ventricular hypertrophy)    WITH NORMAL SYSTOLIC FUNCTION  . SOB (shortness of breath)     Tobacco Use: History  Smoking Status  . Former Smoker  . Quit date: 05/26/1965  Smokeless Tobacco  . Never Used    Labs: Recent Review Flowsheet Data    Labs for ITP Cardiac and Pulmonary Rehab Latest Ref Rng & Units 05/19/2015 07/06/2015 07/06/2015 02/03/2016 02/24/2016   Cholestrol 100 - 199 mg/dL 155 - - 191 196   LDLCALC 0 - 99 mg/dL 88 - - 113(H) 115(H)   HDL >39 mg/dL 37(L) - - 46 58   Trlycerides 0 - 149 mg/dL 151(H) - - 158(H) 115   PHART 7.350 - 7.450 - - 7.361 - -   PCO2ART 35.0 - 45.0 mmHg - - 29.7(L) - -   HCO3 20.0 - 24.0 mEq/L - 18.6(L) 16.8(L) - -   TCO2 0 - 100 mmol/L - 20 18 - -   ACIDBASEDEF 0.0 - 2.0 mmol/L - 7.0(H) 8.0(H) - -   O2SAT % - 56.0 93.0 - -  Capillary Blood Glucose: No results found for: GLUCAP   Exercise Target Goals:    Exercise Program Goal: Individual exercise prescription set with THRR, safety & activity barriers. Participant demonstrates ability to understand and report RPE using BORG scale, to self-measure pulse accurately, and to acknowledge the importance of the exercise prescription.  Exercise Prescription Goal: Starting with aerobic activity 30 plus minutes a day, 3 days per week for initial exercise prescription. Provide home exercise prescription and guidelines that participant acknowledges understanding prior to discharge.  Activity Barriers & Risk Stratification:     Activity Barriers & Cardiac Risk Stratification -  01/18/16 1444      Activity Barriers & Cardiac Risk Stratification   Activity Barriers Arthritis;Back Problems   Cardiac Risk Stratification High      6 Minute Walk:     6 Minute Walk    Row Name 01/18/16 1621         6 Minute Walk   Phase Initial     Distance 800 feet     Walk Time 6 minutes     # of Rest Breaks 0     MPH 1.5     METS 0.92     RPE 13     VO2 Peak 3.2     Symptoms Yes (comment)     Comments fatigue     Resting HR 54 bpm     Resting BP 112/70     Max Ex. HR 76 bpm     Max Ex. BP 140/76     2 Minute Post BP 146/60  recheck 118/68        Initial Exercise Prescription:     Initial Exercise Prescription - 01/18/16 1600      Date of Initial Exercise RX and Referring Provider   Date 01/18/16   Referring Provider Peter, [place], MD     Recumbant Bike   Level 1   Minutes 10   METs 1     NuStep   Level 1   Minutes 10   METs 1.3     Track   Laps 6   Minutes 10   METs 2.03     Prescription Details   Frequency (times per week) 3   Duration Progress to 30 minutes of continuous aerobic without signs/symptoms of physical distress     Intensity   THRR 40-80% of Max Heartrate 56-113   Ratings of Perceived Exertion 11-13   Perceived Dyspnea 0-4     Progression   Progression Continue progressive overload as per policy without signs/symptoms or physical distress.     Resistance Training   Training Prescription Yes   Weight 1lb   Reps 10-12      Perform Capillary Blood Glucose checks as needed.  Exercise Prescription Changes:     Exercise Prescription Changes    Row Name 02/01/16 1000 03/02/16 0700           Exercise Review   Progression Yes Yes        Response to Exercise   Blood Pressure (Admit) 114/70 152/76  recheck 144/74      Blood Pressure (Exercise) 142/80 154/84      Blood Pressure (Exit) 132/62 124/82      Heart Rate (Admit) 67 bpm 66 bpm      Heart Rate (Exercise) 81 bpm 90 bpm      Heart Rate (Exit) 60 bpm 69  bpm      Rating of Perceived Exertion (Exercise) 13 13  Symptoms none none      Comments  - Reviewed HEP on 02/11/16      Duration Progress to 30 minutes of continuous aerobic without signs/symptoms of physical distress Progress to 30 minutes of continuous aerobic without signs/symptoms of physical distress      Intensity THRR unchanged THRR unchanged        Progression   Progression Continue progressive overload as per policy without signs/symptoms or physical distress. Continue progressive overload as per policy without signs/symptoms or physical distress.      Average METs 2.1 2.3        Resistance Training   Training Prescription Yes Yes      Weight 1lb 2lbs      Reps 10-12 10-12        NuStep   Level 2 3      Minutes 20 20      METs 2 2.3        Track   Laps 7 6      Minutes 10 10      METs 2.23 2.03        Home Exercise Plan   Plans to continue exercise at  - Home  HEP reviewed on 02/11/16      Frequency  - Add 2 additional days to program exercise sessions.         Exercise Comments:     Exercise Comments    Row Name 02/01/16 1041 02/01/16 1046 02/11/16 0859 03/02/16 0755     Exercise Comments There are no changes to current Ex. Rx will continue to monitor exercise progression There are no changes to current Ex. Rx will continue to monitor exercise progression.  Reviewed home exercise guidelines with patient. Patient plans to incorporate walking at home starting 1 day per week and progressing to 2 days per week, with the eventual goal of walking 2-3 days per week at home in addition to cardiac rehab exercise. Reviewed METs and goals. Pt is tolerating exercise well; will continue to monitor exercise progression.       Discharge Exercise Prescription (Final Exercise Prescription Changes):     Exercise Prescription Changes - 03/02/16 0700      Exercise Review   Progression Yes     Response to Exercise   Blood Pressure (Admit) 152/76  recheck 144/74   Blood  Pressure (Exercise) 154/84   Blood Pressure (Exit) 124/82   Heart Rate (Admit) 66 bpm   Heart Rate (Exercise) 90 bpm   Heart Rate (Exit) 69 bpm   Rating of Perceived Exertion (Exercise) 13   Symptoms none   Comments Reviewed HEP on 02/11/16   Duration Progress to 30 minutes of continuous aerobic without signs/symptoms of physical distress   Intensity THRR unchanged     Progression   Progression Continue progressive overload as per policy without signs/symptoms or physical distress.   Average METs 2.3     Resistance Training   Training Prescription Yes   Weight 2lbs   Reps 10-12     NuStep   Level 3   Minutes 20   METs 2.3     Track   Laps 6   Minutes 10   METs 2.03     Home Exercise Plan   Plans to continue exercise at Home  HEP reviewed on 02/11/16   Frequency Add 2 additional days to program exercise sessions.      Nutrition:  Target Goals: Understanding of nutrition guidelines, daily intake of sodium '1500mg'$ , cholesterol '200mg'$ ,  calories 30% from fat and 7% or less from saturated fats, daily to have 5 or more servings of fruits and vegetables.  Biometrics:     Pre Biometrics - 01/18/16 1618      Pre Biometrics   Height 5' (1.524 m)   Weight 172 lb 6.4 oz (78.2 kg)   Waist Circumference 36.75 inches   Hip Circumference 46.25 inches   Waist to Hip Ratio 0.79 %   BMI (Calculated) 33.7   Triceps Skinfold 40 mm   % Body Fat 46.3 %   Grip Strength 25 kg   Flexibility 12 in   Single Leg Stand 3.21 seconds       Nutrition Therapy Plan and Nutrition Goals:   Nutrition Discharge: Nutrition Scores:   Nutrition Goals Re-Evaluation:   Psychosocial: Target Goals: Acknowledge presence or absence of depression, maximize coping skills, provide positive support system. Participant is able to verbalize types and ability to use techniques and skills needed for reducing stress and depression.  Initial Review & Psychosocial Screening:     Initial Psych Review &  Screening - 01/18/16 [place]? Yes   Comments --  brief assessment reveals no further psychosocial assesment needed at this time      Quality of Life Scores:     Quality of Life - 01/26/16 1148      Quality of Life Scores   Health/Function Pre --  dyspnea and fatigue are pt biggest concerns   GLOBAL Pre --  overall scores are satisfactory.  will continue to monitor      PHQ-9: Recent Review Flowsheet Data    Depression screen [employer] 2/9 01/26/2016   Decreased Interest 0   Down, Depressed, Hopeless 0   PHQ - 2 Score 0      Psychosocial Evaluation and Intervention:     Psychosocial Evaluation - 01/26/16 1145      Psychosocial Evaluation & Interventions   Interventions Encouraged to exercise with the program and follow exercise prescription;Relaxation education   Comments no psychosocial needs identified, no intervention necessary    Continued Psychosocial Services Needed No      Psychosocial Re-Evaluation:     Psychosocial Re-Evaluation    [place] Name 02/01/16 0818 02/28/16 5170           Psychosocial Re-Evaluation   Interventions Encouraged to attend Cardiac Rehabilitation for the exercise Encouraged to attend Cardiac Rehabilitation for the exercise;Stress management education;Relaxation education      Comments no psychosocial needs identified, no interventions necessary  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 pt has neuro consult scheduled.  pt has been cautioned per her retina specialist to decrease workloads to avoid overheating with exercise. will conitnue to monitor.  otherwise no psychosocial needs identified, no interventions necessary.       Continued Psychosocial Services Needed No No         Vocational Rehabilitation: Provide vocational rehab assistance to qualifying candidates.   Vocational Rehab Evaluation & Intervention:     Vocational Rehab - 01/18/16 1747      Initial Vocational  Rehab Evaluation & Intervention   Assessment shows need for Vocational Rehabilitation No  Mrs Uffelman is retired and is not interested in vocational rehab at this time.      Education: Education Goals: Education classes will be provided on a weekly basis, covering required topics. Participant will state understanding/return demonstration of topics presented.  Learning Barriers/Preferences:  Learning Barriers/Preferences - 01/18/16 1352      Learning Barriers/Preferences   Learning Barriers Sight   Learning Preferences Pictoral;Computer/Internet      Education Topics: Count Your Pulse:  -Group instruction provided by verbal instruction, demonstration, patient participation and written materials to support subject.  Instructors address importance of being able to find your pulse and how to count your pulse when at home without a heart monitor.  Patients get hands on experience counting their pulse with staff help and individually.   Heart Attack, Angina, and Risk Factor Modification:  -Group instruction provided by verbal instruction, video, and written materials to support subject.  Instructors address signs and symptoms of angina and heart attacks.    Also discuss risk factors for heart disease and how to make changes to improve heart health risk factors. Flowsheet Row CARDIAC REHAB PHASE II EXERCISE from 02/25/2016 in [facility]  Date  02/23/16  Instruction Review Code  2- meets goals/outcomes      Functional Fitness:  -Group instruction provided by verbal instruction, demonstration, patient participation, and written materials to support subject.  Instructors address safety measures for doing things around the house.  Discuss how to get up and down off the floor, how to pick things up properly, how to safely get out of a chair without assistance, and balance training.   Meditation and Mindfulness:  -Group instruction provided by verbal  instruction, patient participation, and written materials to support subject.  Instructor addresses importance of mindfulness and meditation practice to help reduce stress and improve awareness.  Instructor also leads participants through a meditation exercise.  Flowsheet Row CARDIAC REHAB PHASE II EXERCISE from 02/25/2016 in [facility]  Date  01/26/16  Instruction Review Code  2- meets goals/outcomes      Stretching for Flexibility and Mobility:  -Group instruction provided by verbal instruction, patient participation, and written materials to support subject.  Instructors lead participants through series of stretches that are designed to increase flexibility thus improving mobility.  These stretches are additional exercise for major muscle groups that are typically performed during regular warm up and cool down. Flowsheet Row CARDIAC REHAB PHASE II EXERCISE from 02/25/2016 in [facility]  Date  02/25/16  Instruction Review Code  1- partially meets, needs review/practice      Hands Only CPR Anytime:  -Group instruction provided by verbal instruction, video, patient participation and written materials to support subject.  Instructors co-teach with [facility] video for hands only CPR.  Participants get hands on experience with mannequins.   Nutrition I class: Heart Healthy Eating:  -Group instruction provided by PowerPoint slides, verbal discussion, and written materials to support subject matter. The instructor gives an explanation and review of the Therapeutic Lifestyle Changes diet recommendations, which includes a discussion on lipid goals, dietary fat, sodium, fiber, plant stanol/sterol esters, sugar, and the components of a well-balanced, healthy diet.   Nutrition II class: Lifestyle Skills:  -Group instruction provided by PowerPoint slides, verbal discussion, and written materials to support subject matter. The instructor gives an  explanation and review of label reading, grocery shopping for heart health, heart healthy recipe modifications, and ways to make healthier choices when eating out.   Diabetes Question & Answer:  -Group instruction provided by PowerPoint slides, verbal discussion, and written materials to support subject matter. The instructor gives an explanation and review of diabetes co-morbidities, pre- and post-prandial blood glucose goals, pre-exercise blood glucose goals, signs,  symptoms, and treatment of hypoglycemia and hyperglycemia, and foot care basics. Flowsheet Row CARDIAC REHAB PHASE II EXERCISE from 02/25/2016 in [facility] CARDIAC REHAB  Date  02/11/16  Educator  RD  Instruction Review Code  2- meets goals/outcomes      Diabetes Blitz:  -Group instruction provided by PowerPoint slides, verbal discussion, and written materials to support subject matter. The instructor gives an explanation and review of the physiology behind type 1 and type 2 diabetes, diabetes medications and rational behind using different medications, pre- and post-prandial blood glucose recommendations and Hemoglobin A1c goals, diabetes diet, and exercise including blood glucose guidelines for exercising safely.    Portion Distortion:  -Group instruction provided by PowerPoint slides, verbal discussion, written materials, and food models to support subject matter. The instructor gives an explanation of serving size versus portion size, changes in portions sizes over the last 20 years, and what consists of a serving from each food group. Flowsheet Row CARDIAC REHAB PHASE II EXERCISE from 02/25/2016 in [facility] CARDIAC REHAB  Date  02/02/16  Educator  RD  Instruction Review Code  2- meets goals/outcomes      Stress Management:  -Group instruction provided by verbal instruction, video, and written materials to support subject matter.  Instructors review role of stress in heart disease and  how to cope with stress positively.     Exercising on Your Own:  -Group instruction provided by verbal instruction, power point, and written materials to support subject.  Instructors discuss benefits of exercise, components of exercise, frequency and intensity of exercise, and end points for exercise.  Also discuss use of nitroglycerin and activating EMS.  Review options of places to exercise outside of rehab.  Review guidelines for sex with heart disease.   Cardiac Drugs I:  -Group instruction provided by verbal instruction and written materials to support subject.  Instructor reviews cardiac drug classes: antiplatelets, anticoagulants, beta blockers, and statins.  Instructor discusses reasons, side effects, and lifestyle considerations for each drug class.   Cardiac Drugs II:  -Group instruction provided by verbal instruction and written materials to support subject.  Instructor reviews cardiac drug classes: angiotensin converting enzyme inhibitors (ACE-I), angiotensin II receptor blockers (ARBs), nitrates, and calcium channel blockers.  Instructor discusses reasons, side effects, and lifestyle considerations for each drug class. Flowsheet Row CARDIAC REHAB PHASE II EXERCISE from 02/25/2016 in [facility] CARDIAC REHAB  Date  02/09/16  Instruction Review Code  2- meets goals/outcomes      Anatomy and Physiology of the Circulatory System:  -Group instruction provided by verbal instruction, video, and written materials to support subject.  Reviews functional anatomy of heart, how it relates to various diagnoses, and what role the heart plays in the overall system.   Knowledge Questionnaire Score:     Knowledge Questionnaire Score - 01/18/16 1359      Knowledge Questionnaire Score   Pre Score 20/24      Core Components/Risk Factors/Patient Goals at Admission:     Personal Goals and Risk Factors at Admission - 01/18/16 1352      Core Components/Risk  Factors/Patient Goals on Admission   Sedentary Yes   Intervention Provide advice, education, support and counseling about physical activity/exercise needs.;Develop an individualized exercise prescription for aerobic and resistive training based on initial evaluation findings, risk stratification, comorbidities and participant's personal goals.   Expected Outcomes Achievement of increased cardiorespiratory fitness and enhanced flexibility, muscular endurance and strength shown through measurements of functional  capacity and personal statement of participant.   Lipids Yes   Intervention Provide education and support for participant on nutrition & aerobic/resistive exercise along with prescribed medications to achieve LDL <59m, HDL >446m   Expected Outcomes Short Term: Participant states understanding of desired cholesterol values and is compliant with medications prescribed. Participant is following exercise prescription and nutrition guidelines.;Long Term: Cholesterol controlled with medications as prescribed, with individualized exercise RX and with personalized nutrition plan. Value goals: LDL < 70107mHDL > 40 mg.   Stress Yes   Intervention Offer individual and/or small group education and counseling on adjustment to heart disease, stress management and health-related lifestyle change. Teach and support self-help strategies.;Refer participants experiencing significant psychosocial distress to appropriate mental health specialists for further evaluation and treatment. When possible, include family members and significant others in education/counseling sessions.   Expected Outcomes Long Term: Emotional wellbeing is indicated by absence of clinically significant psychosocial distress or social isolation.;Short Term: Participant demonstrates changes in health-related behavior, relaxation and other stress management skills, ability to obtain effective social support, and compliance with psychotropic  medications if prescribed.      Core Components/Risk Factors/Patient Goals Review:      Goals and Risk Factor Review    Row Name 03/02/16 0751 03/02/16 0755           Core Components/Risk Factors/Patient Goals Review   Personal Goals Review Other  -      Review Pt feels good and has increased stamina. Has some complications with eyes due to macular degeneration. Discussed not over doing it with exercise and avoid heavy lifting. Pt feels good and has increased stamina. 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 Discussed not over doing it with exercise and avoid heavy lifting.      Expected Outcomes Pt will continue to tolerate exercise without difficulty or complications and have increased stamina.  -         Core Components/Risk Factors/Patient Goals at Discharge (Final Review):      Goals and Risk Factor Review - 03/02/16 0755      Core Components/Risk Factors/Patient Goals Review   Review Pt feels good and has increased stamina. 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 Discussed not over doing it with exercise and avoid heavy lifting.      ITP Comments:     ITP Comments    Row Name 01/18/16 1351 01/28/16 0826         ITP Comments Dr. TraFransico Himedical Director Attended CPR education class, Met outcomes/goals          Comments: Pt is making expected progress toward personal goals after completing 11 sessions.  Pt has had some medical absences.   Recommend continued exercise and life style modification education including  stress management and relaxation techniques to decrease cardiac risk profile.

## 2016-03-06 ENCOUNTER — Encounter (HOSPITAL_COMMUNITY): Payer: Medicare Other

## 2016-03-08 ENCOUNTER — Encounter (HOSPITAL_COMMUNITY): Payer: Medicare Other

## 2016-03-10 ENCOUNTER — Encounter (HOSPITAL_COMMUNITY): Payer: Medicare Other

## 2016-03-11 DIAGNOSIS — J329 Chronic sinusitis, unspecified: Secondary | ICD-10-CM | POA: Diagnosis not present

## 2016-03-11 DIAGNOSIS — H47011 Ischemic optic neuropathy, right eye: Secondary | ICD-10-CM | POA: Diagnosis not present

## 2016-03-13 ENCOUNTER — Encounter (HOSPITAL_COMMUNITY)
Admission: RE | Admit: 2016-03-13 | Discharge: 2016-03-13 | Disposition: A | Discharge status: Home / Self Care | Payer: Medicare Other | Source: Ambulatory Visit | Attending: Cardiology | Admitting: Cardiology

## 2016-03-13 DIAGNOSIS — E7800 Pure hypercholesterolemia, unspecified: Secondary | ICD-10-CM | POA: Diagnosis not present

## 2016-03-13 DIAGNOSIS — I4891 Unspecified atrial fibrillation: Secondary | ICD-10-CM | POA: Insufficient documentation

## 2016-03-13 DIAGNOSIS — Z7901 Long term (current) use of anticoagulants: Secondary | ICD-10-CM | POA: Insufficient documentation

## 2016-03-13 DIAGNOSIS — Z87891 Personal history of nicotine dependence: Secondary | ICD-10-CM | POA: Insufficient documentation

## 2016-03-13 DIAGNOSIS — Z952 Presence of prosthetic heart valve: Secondary | ICD-10-CM

## 2016-03-13 DIAGNOSIS — I5032 Chronic diastolic (congestive) heart failure: Secondary | ICD-10-CM | POA: Diagnosis not present

## 2016-03-13 DIAGNOSIS — I421 Obstructive hypertrophic cardiomyopathy: Secondary | ICD-10-CM | POA: Insufficient documentation

## 2016-03-13 DIAGNOSIS — Z9889 Other specified postprocedural states: Secondary | ICD-10-CM

## 2016-03-13 DIAGNOSIS — Z79899 Other long term (current) drug therapy: Secondary | ICD-10-CM | POA: Diagnosis not present

## 2016-03-13 DIAGNOSIS — Z8679 Personal history of other diseases of the circulatory system: Secondary | ICD-10-CM

## 2016-03-13 NOTE — Progress Notes (Signed)
Melinda Gonzales 82 y.o. female Nutrition Note Spoke with pt. Nutrition Plan and Nutrition Survey goals reviewed with pt. Pt is following Step 2 of the Therapeutic Lifestyle Changes diet. Pt wants to lose wt. Pt has not been actively trying to lose wt. Wt loss tips reviewed. Pt expressed understanding of the information reviewed. Pt aware of nutrition education classes offered and plans on attending nutrition classes.  No results found for: HGBA1C Wt Readings from Last 3 Encounters:  02/10/16 176 lb (79.8 kg)  01/18/16 172 lb 6.4 oz (78.2 kg)  12/21/15 170 lb 3.2 oz (77.2 kg)   Nutrition Diagnosis ? Food-and nutrition-related knowledge deficit related to lack of exposure to information as related to diagnosis of: ? CVD ? Obesity related to excessive energy intake as evidenced by a BMI of 33.7  Nutrition Intervention ? Pt's individual nutrition plan reviewed with pt. ? Benefits of adopting Therapeutic Lifestyle Changes discussed when Medficts reviewed. ? Pt to attend the Portion Distortion class - met 02/02/16 ? Pt to attend the   ? Nutrition I class                  ? Nutrition II class ?   Continue client-centered nutrition education by RD, as part of interdisciplinary care. Goal(s) ? Pt to identify food quantities necessary to achieve weight loss of 6-24 lb (2.7-10.9 kg) at graduation from cardiac rehab.   Monitor and Evaluate progress toward nutrition goal with team. Derek [place], M.Ed, RD, LDN, CDE 03/13/2016 9:18 AM

## 2016-03-15 ENCOUNTER — Encounter (HOSPITAL_COMMUNITY)
Admission: RE | Admit: 2016-03-15 | Discharge: 2016-03-15 | Disposition: A | Discharge status: Home / Self Care | Payer: Medicare Other | Source: Ambulatory Visit | Attending: Cardiology | Admitting: Cardiology

## 2016-03-15 DIAGNOSIS — I4891 Unspecified atrial fibrillation: Secondary | ICD-10-CM | POA: Diagnosis not present

## 2016-03-15 DIAGNOSIS — Z79899 Other long term (current) drug therapy: Secondary | ICD-10-CM | POA: Diagnosis not present

## 2016-03-15 DIAGNOSIS — E7800 Pure hypercholesterolemia, unspecified: Secondary | ICD-10-CM | POA: Diagnosis not present

## 2016-03-15 DIAGNOSIS — Z952 Presence of prosthetic heart valve: Secondary | ICD-10-CM

## 2016-03-15 DIAGNOSIS — Z9889 Other specified postprocedural states: Secondary | ICD-10-CM

## 2016-03-15 DIAGNOSIS — Z7901 Long term (current) use of anticoagulants: Secondary | ICD-10-CM | POA: Diagnosis not present

## 2016-03-15 DIAGNOSIS — Z8679 Personal history of other diseases of the circulatory system: Secondary | ICD-10-CM

## 2016-03-15 DIAGNOSIS — I5032 Chronic diastolic (congestive) heart failure: Secondary | ICD-10-CM | POA: Diagnosis not present

## 2016-03-17 ENCOUNTER — Encounter (HOSPITAL_COMMUNITY)
Admission: RE | Admit: 2016-03-17 | Discharge: 2016-03-17 | Disposition: A | Discharge status: Home / Self Care | Payer: Medicare Other | Source: Ambulatory Visit | Attending: Cardiology | Admitting: Cardiology

## 2016-03-17 DIAGNOSIS — E7800 Pure hypercholesterolemia, unspecified: Secondary | ICD-10-CM | POA: Diagnosis not present

## 2016-03-17 DIAGNOSIS — Z7901 Long term (current) use of anticoagulants: Secondary | ICD-10-CM | POA: Diagnosis not present

## 2016-03-17 DIAGNOSIS — I4891 Unspecified atrial fibrillation: Secondary | ICD-10-CM | POA: Diagnosis not present

## 2016-03-17 DIAGNOSIS — Z9889 Other specified postprocedural states: Secondary | ICD-10-CM

## 2016-03-17 DIAGNOSIS — Z8679 Personal history of other diseases of the circulatory system: Secondary | ICD-10-CM

## 2016-03-17 DIAGNOSIS — I5032 Chronic diastolic (congestive) heart failure: Secondary | ICD-10-CM | POA: Diagnosis not present

## 2016-03-17 DIAGNOSIS — Z952 Presence of prosthetic heart valve: Secondary | ICD-10-CM | POA: Diagnosis not present

## 2016-03-17 DIAGNOSIS — Z79899 Other long term (current) drug therapy: Secondary | ICD-10-CM | POA: Diagnosis not present

## 2016-03-20 ENCOUNTER — Encounter (HOSPITAL_COMMUNITY): Payer: Medicare Other

## 2016-03-22 ENCOUNTER — Encounter (HOSPITAL_COMMUNITY)
Admission: RE | Admit: 2016-03-22 | Discharge: 2016-03-22 | Disposition: A | Discharge status: Home / Self Care | Payer: Medicare Other | Source: Ambulatory Visit | Attending: Cardiology | Admitting: Cardiology

## 2016-03-22 DIAGNOSIS — I5032 Chronic diastolic (congestive) heart failure: Secondary | ICD-10-CM | POA: Diagnosis not present

## 2016-03-22 DIAGNOSIS — Z79899 Other long term (current) drug therapy: Secondary | ICD-10-CM | POA: Diagnosis not present

## 2016-03-22 DIAGNOSIS — I4891 Unspecified atrial fibrillation: Secondary | ICD-10-CM | POA: Diagnosis not present

## 2016-03-22 DIAGNOSIS — Z8679 Personal history of other diseases of the circulatory system: Secondary | ICD-10-CM

## 2016-03-22 DIAGNOSIS — Z7901 Long term (current) use of anticoagulants: Secondary | ICD-10-CM | POA: Diagnosis not present

## 2016-03-22 DIAGNOSIS — Z952 Presence of prosthetic heart valve: Secondary | ICD-10-CM

## 2016-03-22 DIAGNOSIS — Z9889 Other specified postprocedural states: Secondary | ICD-10-CM

## 2016-03-22 DIAGNOSIS — E7800 Pure hypercholesterolemia, unspecified: Secondary | ICD-10-CM | POA: Diagnosis not present

## 2016-03-24 ENCOUNTER — Encounter (HOSPITAL_COMMUNITY)
Admission: RE | Admit: 2016-03-24 | Discharge: 2016-03-24 | Disposition: A | Discharge status: Home / Self Care | Payer: Medicare Other | Source: Ambulatory Visit | Attending: Cardiology | Admitting: Cardiology

## 2016-03-24 DIAGNOSIS — Z79899 Other long term (current) drug therapy: Secondary | ICD-10-CM | POA: Diagnosis not present

## 2016-03-24 DIAGNOSIS — Z7901 Long term (current) use of anticoagulants: Secondary | ICD-10-CM | POA: Diagnosis not present

## 2016-03-24 DIAGNOSIS — E7800 Pure hypercholesterolemia, unspecified: Secondary | ICD-10-CM | POA: Diagnosis not present

## 2016-03-24 DIAGNOSIS — I4891 Unspecified atrial fibrillation: Secondary | ICD-10-CM | POA: Diagnosis not present

## 2016-03-24 DIAGNOSIS — I5032 Chronic diastolic (congestive) heart failure: Secondary | ICD-10-CM | POA: Diagnosis not present

## 2016-03-24 DIAGNOSIS — Z952 Presence of prosthetic heart valve: Secondary | ICD-10-CM | POA: Diagnosis not present

## 2016-03-24 DIAGNOSIS — Z9889 Other specified postprocedural states: Secondary | ICD-10-CM

## 2016-03-24 DIAGNOSIS — Z8679 Personal history of other diseases of the circulatory system: Secondary | ICD-10-CM

## 2016-03-27 ENCOUNTER — Encounter (HOSPITAL_COMMUNITY)
Admission: RE | Admit: 2016-03-27 | Discharge: 2016-03-27 | Disposition: A | Discharge status: Home / Self Care | Payer: Medicare Other | Source: Ambulatory Visit | Attending: Cardiology | Admitting: Cardiology

## 2016-03-27 DIAGNOSIS — E7800 Pure hypercholesterolemia, unspecified: Secondary | ICD-10-CM | POA: Diagnosis not present

## 2016-03-27 DIAGNOSIS — Z9889 Other specified postprocedural states: Secondary | ICD-10-CM

## 2016-03-27 DIAGNOSIS — Z7901 Long term (current) use of anticoagulants: Secondary | ICD-10-CM | POA: Diagnosis not present

## 2016-03-27 DIAGNOSIS — Z79899 Other long term (current) drug therapy: Secondary | ICD-10-CM | POA: Diagnosis not present

## 2016-03-27 DIAGNOSIS — I5032 Chronic diastolic (congestive) heart failure: Secondary | ICD-10-CM | POA: Diagnosis not present

## 2016-03-27 DIAGNOSIS — Z8679 Personal history of other diseases of the circulatory system: Secondary | ICD-10-CM

## 2016-03-27 DIAGNOSIS — Z952 Presence of prosthetic heart valve: Secondary | ICD-10-CM | POA: Diagnosis not present

## 2016-03-27 DIAGNOSIS — I4891 Unspecified atrial fibrillation: Secondary | ICD-10-CM | POA: Diagnosis not present

## 2016-03-28 ENCOUNTER — Telehealth (HOSPITAL_COMMUNITY): Payer: Self-pay | Admitting: Cardiac Rehabilitation

## 2016-03-28 NOTE — Telephone Encounter (Signed)
-----   Message from Peter M Martinique, MD sent at 03/27/2016  5:10 PM EST ----- Regarding: RE: cardiac rehab  Weight may be result of steroids. Lets monitor for now.  Peter Martinique MD, [facility]  ----- Message ----- From: Lowell Guitar, RN Sent: 03/27/2016   2:40 PM To: Peter M Martinique, MD Subject: cardiac rehab                                  Dear Dr. Martinique,  [place] weight up 1.9kg over weekend.  (79.2kg today, 77.3kg Friday).  pt weight has previously been 79kg, she had progressively lost down to 76kg, and now back up to 79kg.    Today she is c/o more dyspnea on exertion than usual. Lungs clear, trace bilateral ankle edema.   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  Of note, she was also hypertensive BP:  162/82 at rest pre exercise.  Peak exercise:   140/82, post BP:  142/80.  HR:  89-97.    Please advise.  Thank you, Andi Hence, RN, BSN Cardiac Pulmonary Rehab

## 2016-03-29 ENCOUNTER — Encounter (HOSPITAL_COMMUNITY): Payer: Self-pay

## 2016-03-29 ENCOUNTER — Encounter (HOSPITAL_COMMUNITY)
Admission: RE | Admit: 2016-03-29 | Discharge: 2016-03-29 | Disposition: A | Discharge status: Home / Self Care | Payer: Medicare Other | Source: Ambulatory Visit | Attending: Cardiology | Admitting: Cardiology

## 2016-03-29 DIAGNOSIS — Z7901 Long term (current) use of anticoagulants: Secondary | ICD-10-CM | POA: Diagnosis not present

## 2016-03-29 DIAGNOSIS — I4891 Unspecified atrial fibrillation: Secondary | ICD-10-CM | POA: Diagnosis not present

## 2016-03-29 DIAGNOSIS — Z79899 Other long term (current) drug therapy: Secondary | ICD-10-CM | POA: Diagnosis not present

## 2016-03-29 DIAGNOSIS — Z952 Presence of prosthetic heart valve: Secondary | ICD-10-CM | POA: Diagnosis not present

## 2016-03-29 DIAGNOSIS — Z9889 Other specified postprocedural states: Secondary | ICD-10-CM

## 2016-03-29 DIAGNOSIS — I5032 Chronic diastolic (congestive) heart failure: Secondary | ICD-10-CM | POA: Diagnosis not present

## 2016-03-29 DIAGNOSIS — E7800 Pure hypercholesterolemia, unspecified: Secondary | ICD-10-CM | POA: Diagnosis not present

## 2016-03-29 DIAGNOSIS — Z8679 Personal history of other diseases of the circulatory system: Secondary | ICD-10-CM

## 2016-03-29 NOTE — Progress Notes (Signed)
Cardiac Individual Treatment Plan  Patient Details  Name: Melinda Gonzales MRN: [id number] Date of Birth: 1934/06/13 Referring Provider:   Flowsheet Row CARDIAC REHAB PHASE II ORIENTATION from 01/18/2016 in [facility]  Referring Provider  Peter, [place], MD      Initial Encounter Date:  [place] PHASE II ORIENTATION from 01/18/2016 in [facility]  Date  01/18/16  Referring Provider  Peter, [place], MD      Visit Diagnosis: S/P MVR (mitral valve replacement)  S/P Maze operation for atrial fibrillation  Patient's Home Medications on Admission:  Current Outpatient Prescriptions:  .  acetaminophen (TYLENOL) 325 MG tablet, Take 650 mg by mouth every 6 (six) hours as needed., Disp: , Rfl:  .  albuterol (PROVENTIL HFA;VENTOLIN HFA) 108 (90 Base) MCG/ACT inhaler, Inhale 1-2 puffs into the lungs every 6 (six) hours as needed for wheezing or shortness of breath., Disp: 1 Inhaler, Rfl: 0 .  azithromycin (ZITHROMAX) 250 MG tablet, Take 1 tablet (250 mg total) by mouth daily. Take first 2 tablets together, then 1 every day until finished., Disp: 6 tablet, Rfl: 0 .  benzonatate (TESSALON) 100 MG capsule, Take 1 capsule (100 mg total) by mouth every 8 (eight) hours., Disp: 21 capsule, Rfl: 0 .  furosemide (LASIX) 20 MG tablet, Take 20 mg daily if needed for increased swelling, Disp: 90 tablet, Rfl: 3 .  metoprolol succinate (TOPROL-XL) 50 MG 24 hr tablet, Take 1 tablet (50 mg total) by mouth daily. Take with or immediately following a meal., Disp: 90 tablet, Rfl: 3 .  Multiple Vitamins-Minerals (PRESERVISION/LUTEIN PO), Take by mouth., Disp: , Rfl:  .  pantoprazole (PROTONIX) 20 MG tablet, Take 1 tablet (20 mg total) by mouth daily., Disp: 90 tablet, Rfl: 3 .  predniSONE (DELTASONE) 20 MG tablet, Take 40 mg by mouth daily with breakfast., Disp: , Rfl:  .  rivaroxaban (XARELTO) 20 MG TABS tablet, Take 1 tablet (20 mg  total) by mouth daily with supper., Disp: 90 tablet, Rfl: 3 .  rosuvastatin (CRESTOR) 5 MG tablet, Take 1 tablet (5 mg total) by mouth daily., Disp: 90 tablet, Rfl: 3  Past Medical History: Past Medical History:  Diagnosis Date  . Chronic diastolic CHF (congestive heart failure) ([place]) 07/06/2015  . Dyspnea   . Heart murmur   . Hypercholesterolemia   . Hypertrophic obstructive cardiomyopathy (HOCM) ([place])   . LVH (left ventricular hypertrophy)    WITH NORMAL SYSTOLIC FUNCTION  . SOB (shortness of breath)     Tobacco Use: History  Smoking Status  . Former Smoker  . Quit date: 05/26/1965  Smokeless Tobacco  . Never Used    Labs: Recent Review Flowsheet Data    Labs for ITP Cardiac and Pulmonary Rehab Latest Ref Rng & Units 05/19/2015 07/06/2015 07/06/2015 02/03/2016 02/24/2016   Cholestrol 100 - 199 mg/dL 155 - - 191 196   LDLCALC 0 - 99 mg/dL 88 - - 113(H) 115(H)   HDL >39 mg/dL 37(L) - - 46 58   Trlycerides 0 - 149 mg/dL 151(H) - - 158(H) 115   PHART 7.350 - 7.450 - - 7.361 - -   PCO2ART 35.0 - 45.0 mmHg - - 29.7(L) - -   HCO3 20.0 - 24.0 mEq/L - 18.6(L) 16.8(L) - -   TCO2 0 - 100 mmol/L - 20 18 - -   ACIDBASEDEF 0.0 - 2.0 mmol/L - 7.0(H) 8.0(H) - -   O2SAT % - 56.0 93.0 - -  Capillary Blood Glucose: No results found for: GLUCAP   Exercise Target Goals:    Exercise Program Goal: Individual exercise prescription set with THRR, safety & activity barriers. Participant demonstrates ability to understand and report RPE using BORG scale, to self-measure pulse accurately, and to acknowledge the importance of the exercise prescription.  Exercise Prescription Goal: Starting with aerobic activity 30 plus minutes a day, 3 days per week for initial exercise prescription. Provide home exercise prescription and guidelines that participant acknowledges understanding prior to discharge.  Activity Barriers & Risk Stratification:     Activity Barriers & Cardiac Risk Stratification -  01/18/16 1444      Activity Barriers & Cardiac Risk Stratification   Activity Barriers Arthritis;Back Problems   Cardiac Risk Stratification High      6 Minute Walk:     6 Minute Walk    Row Name 01/18/16 1621         6 Minute Walk   Phase Initial     Distance 800 feet     Walk Time 6 minutes     # of Rest Breaks 0     MPH 1.5     METS 0.92     RPE 13     VO2 Peak 3.2     Symptoms Yes (comment)     Comments fatigue     Resting HR 54 bpm     Resting BP 112/70     Max Ex. HR 76 bpm     Max Ex. BP 140/76     2 Minute Post BP 146/60  recheck 118/68        Oxygen Initial Assessment:   Oxygen Re-Evaluation:   Oxygen Discharge (Final Oxygen Re-Evaluation):   Initial Exercise Prescription:     Initial Exercise Prescription - 01/18/16 1600      Date of Initial Exercise RX and Referring Provider   Date 01/18/16   Referring Provider Peter, [place], MD     Recumbant Bike   Level 1   Minutes 10   METs 1     NuStep   Level 1   Minutes 10   METs 1.3     Track   Laps 6   Minutes 10   METs 2.03     Prescription Details   Frequency (times per week) 3   Duration Progress to 30 minutes of continuous aerobic without signs/symptoms of physical distress     Intensity   THRR 40-80% of Max Heartrate 56-113   Ratings of Perceived Exertion 11-13   Perceived Dyspnea 0-4     Progression   Progression Continue progressive overload as per policy without signs/symptoms or physical distress.     Resistance Training   Training Prescription Yes   Weight 1lb   Reps 10-12      Perform Capillary Blood Glucose checks as needed.  Exercise Prescription Changes:     Exercise Prescription Changes    Row Name 02/01/16 1000 03/02/16 0700 03/27/16 1600         Response to Exercise   Blood Pressure (Admit) 114/70 152/76  recheck 144/74 162/82  recheck 144/74     Blood Pressure (Exercise) 142/80 154/84 140/82     Blood Pressure (Exit) 132/62 124/82 142/80      Heart Rate (Admit) 67 bpm 66 bpm 89 bpm     Heart Rate (Exercise) 81 bpm 90 bpm 97 bpm     Heart Rate (Exit) 60 bpm 69 bpm 89 bpm     Rating  of Perceived Exertion (Exercise) '13 13 13     '$ Symptoms none none none     Comments  - Reviewed HEP on 02/11/16 Reviewed HEP on 02/11/16     Duration Progress to 30 minutes of continuous aerobic without signs/symptoms of physical distress Progress to 30 minutes of continuous aerobic without signs/symptoms of physical distress Progress to 30 minutes of  aerobic without signs/symptoms of physical distress     Intensity THRR unchanged THRR unchanged THRR unchanged       Progression   Progression Continue progressive overload as per policy without signs/symptoms or physical distress. Continue progressive overload as per policy without signs/symptoms or physical distress. Continue to progress workloads to maintain intensity without signs/symptoms of physical distress.     Average METs 2.1 2.3 2.3       Resistance Training   Training Prescription Yes Yes Yes     Weight 1lb 2lbs 2lbs     Reps 10-12 10-12 10-15     Time  -  - 10 Minutes       Interval Training   Interval Training  -  - No       NuStep   Level '2 3 4     '$ Minutes '20 20 20     '$ METs 2 2.3 2.2       Arm Ergometer   Level  -  - 1     Minutes  -  - 10     METs  -  - 2.7       Track   Laps 7 6 -     Minutes 10 10 -     METs 2.23 2.03 -       Home Exercise Plan   Plans to continue exercise at  - Home  HEP reviewed on 02/11/16 Home (comment)  HEP reviewed on 02/11/16     Frequency  - Add 2 additional days to program exercise sessions. Add 2 additional days to program exercise sessions.       Exercise Review   Progression Yes Yes Yes        Exercise Comments:     Exercise Comments    Row Name 02/01/16 1041 02/01/16 1046 02/11/16 0859 03/02/16 0755 03/29/16 1501   Exercise Comments There are no changes to current Ex. Rx will continue to monitor exercise progression There are no  changes to current Ex. Rx will continue to monitor exercise progression.  Reviewed home exercise guidelines with patient. Patient plans to incorporate walking at home starting 1 day per week and progressing to 2 days per week, with the eventual goal of walking 2-3 days per week at home in addition to cardiac rehab exercise. Reviewed METs and goals. Pt is tolerating exercise well; will continue to monitor exercise progression. Reviewed METs and goals. Pt is tolerating exercise well; will continue to monitor exercise progression.      Exercise Goals and Review:     Exercise Goals    Row Name 03/29/16 1457             Exercise Goals   Increase Physical Activity Yes       Intervention Provide advice, education, support and counseling about physical activity/exercise needs.;Develop an individualized exercise prescription for aerobic and resistive training based on initial evaluation findings, risk stratification, comorbidities and participant's personal goals.       Expected Outcomes Achievement of increased cardiorespiratory fitness and enhanced flexibility, muscular endurance and strength shown through measurements of functional capacity and  personal statement of participant.       Increase Strength and Stamina Yes       Intervention Provide advice, education, support and counseling about physical activity/exercise needs.;Develop an individualized exercise prescription for aerobic and resistive training based on initial evaluation findings, risk stratification, comorbidities and participant's personal goals.       Expected Outcomes Achievement of increased cardiorespiratory fitness and enhanced flexibility, muscular endurance and strength shown through measurements of functional capacity and personal statement of participant.          Exercise Goals Re-Evaluation :     Exercise Goals Re-Evaluation    Row Name 03/29/16 1457             Exercise Goal Re-Evaluation   Exercise Goals  Review Increase Physical Activity;Increase Strenth and Stamina       Comments Pt has increased in overall fitness capacity and has increased MET average on Nustep. Pt has also added a 3rd station into exercise regimen for variety. Pt is eager about exercise        Expected Outcomes Pt will continue to show passion for healthy living and exercise as well as improve cardiorespiratory fitness.           Discharge Exercise Prescription (Final Exercise Prescription Changes):     Exercise Prescription Changes - 03/27/16 1600      Response to Exercise   Blood Pressure (Admit) 162/82  recheck 144/74   Blood Pressure (Exercise) 140/82   Blood Pressure (Exit) 142/80   Heart Rate (Admit) 89 bpm   Heart Rate (Exercise) 97 bpm   Heart Rate (Exit) 89 bpm   Rating of Perceived Exertion (Exercise) 13   Symptoms none   Comments Reviewed HEP on 02/11/16   Duration Progress to 30 minutes of  aerobic without signs/symptoms of physical distress   Intensity THRR unchanged     Progression   Progression Continue to progress workloads to maintain intensity without signs/symptoms of physical distress.   Average METs 2.3     Resistance Training   Training Prescription Yes   Weight 2lbs   Reps 10-15   Time 10 Minutes     Interval Training   Interval Training No     NuStep   Level 4   Minutes 20   METs 2.2     Arm Ergometer   Level 1   Minutes 10   METs 2.7     Track   Laps --   Minutes --   METs --     Home Exercise Plan   Plans to continue exercise at Home (comment)  HEP reviewed on 02/11/16   Frequency Add 2 additional days to program exercise sessions.     Exercise Review   Progression Yes      Nutrition:  Target Goals: Understanding of nutrition guidelines, daily intake of sodium '1500mg'$ , cholesterol '200mg'$ , calories 30% from fat and 7% or less from saturated fats, daily to have 5 or more servings of fruits and vegetables.  Biometrics:     Pre Biometrics - 01/18/16 1618       Pre Biometrics   Height 5' (1.524 m)   Weight 172 lb 6.4 oz (78.2 kg)   Waist Circumference 36.75 inches   Hip Circumference 46.25 inches   Waist to Hip Ratio 0.79 %   BMI (Calculated) 33.7   Triceps Skinfold 40 mm   % Body Fat 46.3 %   Grip Strength 25 kg   Flexibility 12 in   Single  Leg Stand 3.21 seconds       Nutrition Therapy Plan and Nutrition Goals:     Nutrition Therapy & Goals - 03/13/16 0917      Nutrition Therapy   Diet Therapeutic Lifestyle Changes     Personal Nutrition Goals   Nutrition Goal 1-2 lb wt loss/week to a wt loss goal of 6-24 lb at graduation from [employer], educate and counsel regarding individualized specific dietary modifications aiming towards targeted core components such as weight, hypertension, lipid management, diabetes, heart failure and other comorbidities.   Expected Outcomes Short Term Goal: Understand basic principles of dietary content, such as calories, fat, sodium, cholesterol and nutrients.;Long Term Goal: Adherence to prescribed nutrition plan.      Nutrition Discharge: Nutrition Scores:     Nutrition Assessments - 03/13/16 0917      MEDFICTS Scores   Pre Score 15      Nutrition Goals Re-Evaluation:   Nutrition Goals Re-Evaluation:   Nutrition Goals Discharge (Final Nutrition Goals Re-Evaluation):   Psychosocial: Target Goals: Acknowledge presence or absence of significant depression and/or stress, maximize coping skills, provide positive support system. Participant is able to verbalize types and ability to use techniques and skills needed for reducing stress and depression.  Initial Review & Psychosocial Screening:     Initial Psych Review & Screening - 01/18/16 [place]? Yes   Comments --  brief assessment reveals no further psychosocial assesment needed at this time      Quality of Life Scores:     Quality  of Life - 01/26/16 1148      Quality of Life Scores   Health/Function Pre --  dyspnea and fatigue are pt biggest concerns   GLOBAL Pre --  overall scores are satisfactory.  will continue to monitor      PHQ-9: Recent Review Flowsheet Data    Depression screen [facility] 2/9 01/26/2016   Decreased Interest 0   Down, Depressed, Hopeless 0   PHQ - 2 Score 0     Interpretation of Total Score  Total Score Depression Severity:  1-4 = Minimal depression, 5-9 = Mild depression, 10-14 = Moderate depression, 15-19 = Moderately severe depression, 20-27 = Severe depression   Psychosocial Evaluation and Intervention:     Psychosocial Evaluation - 01/26/16 1145      Psychosocial Evaluation & Interventions   Interventions Encouraged to exercise with the program and follow exercise prescription;Relaxation education   Comments no psychosocial needs identified, no intervention necessary    Continue Psychosocial Services  No      Psychosocial Re-Evaluation:     Psychosocial Re-Evaluation    [place] Name 02/01/16 0818 02/28/16 9509 03/29/16 0744         Psychosocial Re-Evaluation   Current issues with  -  - (P)  Current Stress Concerns     Comments no psychosocial needs identified, no interventions necessary  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 pt has neuro consult scheduled.  pt has been cautioned per her retina specialist to decrease workloads to avoid overheating with exercise. will conitnue to monitor.  otherwise no psychosocial needs identified, no interventions necessary.  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 pt has neuro consult scheduled.  pt has been cautioned per her retina specialist to decrease workloads to avoid overheating with exercise. will conitnue to monitor.  otherwise no psychosocial needs identified, no interventions necessary.      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 pt has neuro consult scheduled.  pt has been cautioned per her retina specialist to decrease workloads to avoid overheating with exercise. will conitnue to monitor.  otherwise no psychosocial needs identified, no interventions necessary.       Vocational Rehabilitation: Provide vocational rehab assistance to qualifying candidates.   Vocational Rehab Evaluation & Intervention:     Vocational Rehab - 01/18/16 1747      Initial Vocational Rehab Evaluation & Intervention   Assessment shows need for Vocational Rehabilitation No  Mrs Holderman is retired and is not interested in vocational rehab at this time.      Education: Education Goals: Education classes will be provided on a weekly basis, covering required topics. Participant will state understanding/return demonstration of topics presented.  Learning Barriers/Preferences:     Learning Barriers/Preferences - 01/18/16 1352      Learning Barriers/Preferences   Learning Barriers Sight   Learning Preferences Pictoral;Computer/Internet      Education Topics: Count Your Pulse:  -Group instruction provided by verbal instruction, demonstration, patient participation and written materials to support subject.  Instructors address importance of being able to find your pulse and how to count your pulse when at home without a heart monitor.  Patients get hands on experience counting their pulse with staff help and individually.   Heart Attack, Angina, and Risk Factor Modification:  -Group instruction provided by  verbal instruction, video, and written materials to support subject.  Instructors address signs and symptoms of angina and heart attacks.    Also discuss risk factors for heart disease and how to make changes to improve heart health risk factors. Flowsheet Row CARDIAC REHAB PHASE II EXERCISE from 03/29/2016 in [facility]  Date  02/23/16  Instruction Review Code  2- meets goals/outcomes      Functional Fitness:  -Group instruction provided by verbal instruction, demonstration, patient participation, and written materials to support subject.  Instructors address safety measures for doing things around the house.  Discuss how to get up and down off the floor, how to pick things up properly, how to safely get out of a chair without assistance, and balance training.   Meditation and Mindfulness:  -Group instruction provided by verbal instruction, patient participation, and written materials to support subject.  Instructor addresses importance of mindfulness and meditation practice to help reduce stress and improve awareness.  Instructor also leads participants through a meditation exercise.  Flowsheet Row CARDIAC REHAB PHASE II EXERCISE from 03/29/2016 in [facility]  Date  03/22/16  Instruction Review Code  2- meets goals/outcomes      Stretching for Flexibility and Mobility:  -Group instruction provided by verbal instruction, patient participation, and written materials to support subject.  Instructors lead participants through series of stretches that are designed to increase flexibility thus improving mobility.  These stretches are additional exercise for major muscle groups that are typically  performed during regular warm up and cool down. Flowsheet Row CARDIAC REHAB PHASE II EXERCISE from 03/29/2016 in [facility] CARDIAC REHAB  Date  03/24/16  Instruction Review Code  1- partially meets, needs review/practice       Hands Only CPR Anytime:  -Group instruction provided by verbal instruction, video, patient participation and written materials to support subject.  Instructors co-teach with [facility] video for hands only CPR.  Participants get hands on experience with mannequins.   Nutrition I class: Heart Healthy Eating:  -Group instruction provided by PowerPoint slides, verbal discussion, and written materials to support subject matter. The instructor gives an explanation and review of the Therapeutic Lifestyle Changes diet recommendations, which includes a discussion on lipid goals, dietary fat, sodium, fiber, plant stanol/sterol esters, sugar, and the components of a well-balanced, healthy diet.   Nutrition II class: Lifestyle Skills:  -Group instruction provided by PowerPoint slides, verbal discussion, and written materials to support subject matter. The instructor gives an explanation and review of label reading, grocery shopping for heart health, heart healthy recipe modifications, and ways to make healthier choices when eating out.   Diabetes Question & Answer:  -Group instruction provided by PowerPoint slides, verbal discussion, and written materials to support subject matter. The instructor gives an explanation and review of diabetes co-morbidities, pre- and post-prandial blood glucose goals, pre-exercise blood glucose goals, signs, symptoms, and treatment of hypoglycemia and hyperglycemia, and foot care basics. Flowsheet Row CARDIAC REHAB PHASE II EXERCISE from 03/29/2016 in [facility] CARDIAC REHAB  Date  02/11/16  Educator  RD  Instruction Review Code  2- meets goals/outcomes      Diabetes Blitz:  -Group instruction provided by PowerPoint slides, verbal discussion, and written materials to support subject matter. The instructor gives an explanation and review of the physiology behind type 1 and type 2 diabetes, diabetes medications and rational behind using different medications,  pre- and post-prandial blood glucose recommendations and Hemoglobin A1c goals, diabetes diet, and exercise including blood glucose guidelines for exercising safely.    Portion Distortion:  -Group instruction provided by PowerPoint slides, verbal discussion, written materials, and food models to support subject matter. The instructor gives an explanation of serving size versus portion size, changes in portions sizes over the last 20 years, and what consists of a serving from each food group. Flowsheet Row CARDIAC REHAB PHASE II EXERCISE from 03/29/2016 in [facility] CARDIAC REHAB  Date  03/29/16  Educator  RD  Instruction Review Code  2- meets goals/outcomes      Stress Management:  -Group instruction provided by verbal instruction, video, and written materials to support subject matter.  Instructors review role of stress in heart disease and how to cope with stress positively.     Exercising on Your Own:  -Group instruction provided by verbal instruction, power point, and written materials to support subject.  Instructors discuss benefits of exercise, components of exercise, frequency and intensity of exercise, and end points for exercise.  Also discuss use of nitroglycerin and activating EMS.  Review options of places to exercise outside of rehab.  Review guidelines for sex with heart disease.   Cardiac Drugs I:  -Group instruction provided by verbal instruction and written materials to support subject.  Instructor reviews cardiac drug classes: antiplatelets, anticoagulants, beta blockers, and statins.  Instructor discusses reasons, side effects, and lifestyle considerations for each drug class. Flowsheet Row CARDIAC REHAB PHASE II EXERCISE from 03/29/2016 in [facility] CARDIAC  REHAB  Date  03/15/16  Instruction Review Code  2- meets goals/outcomes      Cardiac Drugs II:  -Group instruction provided by verbal instruction and written materials to support  subject.  Instructor reviews cardiac drug classes: angiotensin converting enzyme inhibitors (ACE-I), angiotensin II receptor blockers (ARBs), nitrates, and calcium channel blockers.  Instructor discusses reasons, side effects, and lifestyle considerations for each drug class. Flowsheet Row CARDIAC REHAB PHASE II EXERCISE from 03/29/2016 in [place]  Date  02/09/16  Instruction Review Code  2- meets goals/outcomes      Anatomy and Physiology of the Circulatory System:  -Group instruction provided by verbal instruction, video, and written materials to support subject.  Reviews functional anatomy of heart, how it relates to various diagnoses, and what role the heart plays in the overall system.   Knowledge Questionnaire Score:     Knowledge Questionnaire Score - 01/18/16 1359      Knowledge Questionnaire Score   Pre Score 20/24      Core Components/Risk Factors/Patient Goals at Admission:     Personal Goals and Risk Factors at Admission - 01/18/16 1352      Core Components/Risk Factors/Patient Goals on Admission   Sedentary Yes   Intervention Provide advice, education, support and counseling about physical activity/exercise needs.;Develop an individualized exercise prescription for aerobic and resistive training based on initial evaluation findings, risk stratification, comorbidities and participant's personal goals.   Expected Outcomes Achievement of increased cardiorespiratory fitness and enhanced flexibility, muscular endurance and strength shown through measurements of functional capacity and personal statement of participant.   Lipids Yes   Intervention Provide education and support for participant on nutrition & aerobic/resistive exercise along with prescribed medications to achieve LDL '70mg'$ , HDL >'40mg'$ .   Expected Outcomes Short Term: Participant states understanding of desired cholesterol values and is compliant with medications prescribed.  Participant is following exercise prescription and nutrition guidelines.;Long Term: Cholesterol controlled with medications as prescribed, with individualized exercise RX and with personalized nutrition plan. Value goals: LDL < '70mg'$ , HDL > 40 mg.   Stress Yes   Intervention Offer individual and/or small group education and counseling on adjustment to heart disease, stress management and health-related lifestyle change. Teach and support self-help strategies.;Refer participants experiencing significant psychosocial distress to appropriate mental health specialists for further evaluation and treatment. When possible, include family members and significant others in education/counseling sessions.   Expected Outcomes Long Term: Emotional wellbeing is indicated by absence of clinically significant psychosocial distress or social isolation.;Short Term: Participant demonstrates changes in health-related behavior, relaxation and other stress management skills, ability to obtain effective social support, and compliance with psychotropic medications if prescribed.      Core Components/Risk Factors/Patient Goals Review:      Goals and Risk Factor Review    Row Name 03/02/16 0751 03/02/16 0755           Core Components/Risk Factors/Patient Goals Review   Personal Goals Review Other  -      Review Pt feels good and has increased stamina. Has some complications with eyes due to macular degeneration. Discussed not over doing it with exercise and avoid heavy lifting. Pt feels good and has increased stamina. 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 Discussed not over doing it with exercise and avoid heavy lifting.      Expected Outcomes Pt will continue to tolerate exercise without difficulty or complications and have increased stamina.  -  Core Components/Risk Factors/Patient Goals at Discharge (Final Review):      Goals and Risk Factor Review - 03/02/16 0755      Core  Components/Risk Factors/Patient Goals Review   Review Pt feels good and has increased stamina. 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 Discussed not over doing it with exercise and avoid heavy lifting.      ITP Comments:     ITP Comments    Row Name 01/18/16 1351 01/28/16 0826         ITP Comments Dr. Fransico Him, Medical Director Attended CPR education class, Met outcomes/goals          Comments: Pt is making expected progress toward personal goals after completing 17 sessions. Recommend continued exercise and life style modification education including  stress management and relaxation techniques to decrease cardiac risk profile.

## 2016-03-31 ENCOUNTER — Encounter (HOSPITAL_COMMUNITY): Payer: Self-pay

## 2016-03-31 ENCOUNTER — Encounter (HOSPITAL_COMMUNITY): Payer: Medicare Other

## 2016-03-31 NOTE — Progress Notes (Signed)
Cardiac Individual Treatment Plan  Patient Details  Name: Melinda Gonzales MRN: [id number] Date of Birth: [birth year] Referring Provider:   Flowsheet Row CARDIAC REHAB PHASE II ORIENTATION from 01/18/2016 in [facility]  Referring Provider  Peter, [place], MD      Initial Encounter Date:  Flowsheet Row CARDIAC REHAB PHASE II ORIENTATION from 01/18/2016 in [facility]  Date  01/18/16  Referring Provider  Peter, [place], MD      Visit Diagnosis: No diagnosis found.  Patient's Home Medications on Admission:  Current Outpatient Prescriptions:  .  acetaminophen (TYLENOL) 325 MG tablet, Take 650 mg by mouth every 6 (six) hours as needed., Disp: , Rfl:  .  albuterol (PROVENTIL HFA;VENTOLIN HFA) 108 (90 Base) MCG/ACT inhaler, Inhale 1-2 puffs into the lungs every 6 (six) hours as needed for wheezing or shortness of breath., Disp: 1 Inhaler, Rfl: 0 .  azithromycin (ZITHROMAX) 250 MG tablet, Take 1 tablet (250 mg total) by mouth daily. Take first 2 tablets together, then 1 every day until finished., Disp: 6 tablet, Rfl: 0 .  benzonatate (TESSALON) 100 MG capsule, Take 1 capsule (100 mg total) by mouth every 8 (eight) hours., Disp: 21 capsule, Rfl: 0 .  furosemide (LASIX) 20 MG tablet, Take 20 mg daily if needed for increased swelling, Disp: 90 tablet, Rfl: 3 .  metoprolol succinate (TOPROL-XL) 50 MG 24 hr tablet, Take 1 tablet (50 mg total) by mouth daily. Take with or immediately following a meal., Disp: 90 tablet, Rfl: 3 .  Multiple Vitamins-Minerals (PRESERVISION/LUTEIN PO), Take by mouth., Disp: , Rfl:  .  pantoprazole (PROTONIX) 20 MG tablet, Take 1 tablet (20 mg total) by mouth daily., Disp: 90 tablet, Rfl: 3 .  predniSONE (DELTASONE) 20 MG tablet, Take 40 mg by mouth daily with breakfast., Disp: , Rfl:  .  rivaroxaban (XARELTO) 20 MG TABS tablet, Take 1 tablet (20 mg total) by mouth daily with supper., Disp: 90 tablet, Rfl: 3 .   rosuvastatin (CRESTOR) 5 MG tablet, Take 1 tablet (5 mg total) by mouth daily., Disp: 90 tablet, Rfl: 3  Past Medical History: Past Medical History:  Diagnosis Date  . Chronic diastolic CHF (congestive heart failure) ([place]) 07/06/2015  . Dyspnea   . Heart murmur   . Hypercholesterolemia   . Hypertrophic obstructive cardiomyopathy (HOCM) ([place])   . LVH (left ventricular hypertrophy)    WITH NORMAL SYSTOLIC FUNCTION  . SOB (shortness of breath)     Tobacco Use: History  Smoking Status  . Former Smoker  . Quit date: 05/26/1965  Smokeless Tobacco  . Never Used    Labs: Recent Review Flowsheet Data    Labs for ITP Cardiac and Pulmonary Rehab Latest Ref Rng & Units 05/19/2015 07/06/2015 07/06/2015 02/03/2016 02/24/2016   Cholestrol 100 - 199 mg/dL 155 - - 191 196   LDLCALC 0 - 99 mg/dL 88 - - 113(H) 115(H)   HDL >39 mg/dL 37(L) - - 46 58   Trlycerides 0 - 149 mg/dL 151(H) - - 158(H) 115   PHART 7.350 - 7.450 - - 7.361 - -   PCO2ART 35.0 - 45.0 mmHg - - 29.7(L) - -   HCO3 20.0 - 24.0 mEq/L - 18.6(L) 16.8(L) - -   TCO2 0 - 100 mmol/L - 20 18 - -   ACIDBASEDEF 0.0 - 2.0 mmol/L - 7.0(H) 8.0(H) - -   O2SAT % - 56.0 93.0 - -      Capillary Blood  Glucose: No results found for: GLUCAP   Exercise Target Goals:    Exercise Program Goal: Individual exercise prescription set with THRR, safety & activity barriers. Participant demonstrates ability to understand and report RPE using BORG scale, to self-measure pulse accurately, and to acknowledge the importance of the exercise prescription.  Exercise Prescription Goal: Starting with aerobic activity 30 plus minutes a day, 3 days per week for initial exercise prescription. Provide home exercise prescription and guidelines that participant acknowledges understanding prior to discharge.  Activity Barriers & Risk Stratification:     Activity Barriers & Cardiac Risk Stratification - 01/18/16 1444      Activity Barriers & Cardiac Risk  Stratification   Activity Barriers Arthritis;Back Problems   Cardiac Risk Stratification High      6 Minute Walk:     6 Minute Walk    Row Name 01/18/16 1621         6 Minute Walk   Phase Initial     Distance 800 feet     Walk Time 6 minutes     # of Rest Breaks 0     MPH 1.5     METS 0.92     RPE 13     VO2 Peak 3.2     Symptoms Yes (comment)     Comments fatigue     Resting HR 54 bpm     Resting BP 112/70     Max Ex. HR 76 bpm     Max Ex. BP 140/76     2 Minute Post BP 146/60  recheck 118/68        Oxygen Initial Assessment:   Oxygen Re-Evaluation:   Oxygen Discharge (Final Oxygen Re-Evaluation):   Initial Exercise Prescription:     Initial Exercise Prescription - 01/18/16 1600      Date of Initial Exercise RX and Referring Provider   Date 01/18/16   Referring Provider Peter, [place], MD     Recumbant Bike   Level 1   Minutes 10   METs 1     NuStep   Level 1   Minutes 10   METs 1.3     Track   Laps 6   Minutes 10   METs 2.03     Prescription Details   Frequency (times per week) 3   Duration Progress to 30 minutes of continuous aerobic without signs/symptoms of physical distress     Intensity   THRR 40-80% of Max Heartrate 56-113   Ratings of Perceived Exertion 11-13   Perceived Dyspnea 0-4     Progression   Progression Continue progressive overload as per policy without signs/symptoms or physical distress.     Resistance Training   Training Prescription Yes   Weight 1lb   Reps 10-12      Perform Capillary Blood Glucose checks as needed.  Exercise Prescription Changes:     Exercise Prescription Changes    Row Name 02/01/16 1000 03/02/16 0700 03/27/16 1600         Response to Exercise   Blood Pressure (Admit) 114/70 152/76  recheck 144/74 162/82  recheck 144/74     Blood Pressure (Exercise) 142/80 154/84 140/82     Blood Pressure (Exit) 132/62 124/82 142/80     Heart Rate (Admit) 67 bpm 66 bpm 89 bpm     Heart Rate  (Exercise) 81 bpm 90 bpm 97 bpm     Heart Rate (Exit) 60 bpm 69 bpm 89 bpm     Rating of Perceived  Exertion (Exercise) 13 13 13      Symptoms none none none     Comments  - Reviewed HEP on 02/11/16 Reviewed HEP on 02/11/16     Duration Progress to 30 minutes of continuous aerobic without signs/symptoms of physical distress Progress to 30 minutes of continuous aerobic without signs/symptoms of physical distress Progress to 30 minutes of  aerobic without signs/symptoms of physical distress     Intensity THRR unchanged THRR unchanged THRR unchanged       Progression   Progression Continue progressive overload as per policy without signs/symptoms or physical distress. Continue progressive overload as per policy without signs/symptoms or physical distress. Continue to progress workloads to maintain intensity without signs/symptoms of physical distress.     Average METs 2.1 2.3 2.3       Resistance Training   Training Prescription Yes Yes Yes     Weight 1lb 2lbs 2lbs     Reps 10-12 10-12 10-15     Time  -  - 10 Minutes       Interval Training   Interval Training  -  - No       NuStep   Level 2 3 4      Minutes 20 20 20      METs 2 2.3 2.2       Arm Ergometer   Level  -  - 1     Minutes  -  - 10     METs  -  - 2.7       Track   Laps 7 6 -     Minutes 10 10 -     METs 2.23 2.03 -       Home Exercise Plan   Plans to continue exercise at  - Home  HEP reviewed on 02/11/16 Home (comment)  HEP reviewed on 02/11/16     Frequency  - Add 2 additional days to program exercise sessions. Add 2 additional days to program exercise sessions.       Exercise Review   Progression Yes Yes Yes        Exercise Comments:     Exercise Comments    Row Name 02/01/16 1041 02/01/16 1046 02/11/16 0859 03/02/16 0755 03/29/16 1501   Exercise Comments There are no changes to current Ex. Rx will continue to monitor exercise progression There are no changes to current Ex. Rx will continue to monitor exercise  progression.  Reviewed home exercise guidelines with patient. Patient plans to incorporate walking at home starting 1 day per week and progressing to 2 days per week, with the eventual goal of walking 2-3 days per week at home in addition to cardiac rehab exercise. Reviewed METs and goals. Pt is tolerating exercise well; will continue to monitor exercise progression. Reviewed METs and goals. Pt is tolerating exercise well; will continue to monitor exercise progression.      Exercise Goals and Review:     Exercise Goals    Row Name 03/29/16 1457             Exercise Goals   Increase Physical Activity Yes       Intervention Provide advice, education, support and counseling about physical activity/exercise needs.;Develop an individualized exercise prescription for aerobic and resistive training based on initial evaluation findings, risk stratification, comorbidities and participant's personal goals.       Expected Outcomes Achievement of increased cardiorespiratory fitness and enhanced flexibility, muscular endurance and strength shown through measurements of functional capacity and personal statement  of participant.       Increase Strength and Stamina Yes       Intervention Provide advice, education, support and counseling about physical activity/exercise needs.;Develop an individualized exercise prescription for aerobic and resistive training based on initial evaluation findings, risk stratification, comorbidities and participant's personal goals.       Expected Outcomes Achievement of increased cardiorespiratory fitness and enhanced flexibility, muscular endurance and strength shown through measurements of functional capacity and personal statement of participant.          Exercise Goals Re-Evaluation :     Exercise Goals Re-Evaluation    Row Name 03/29/16 1457             Exercise Goal Re-Evaluation   Exercise Goals Review Increase Physical Activity;Increase Strenth and Stamina        Comments Pt has increased in overall fitness capacity and has increased MET average on Nustep. Pt has also added a 3rd station into exercise regimen for variety. Pt is eager about exercise        Expected Outcomes Pt will continue to show passion for healthy living and exercise as well as improve cardiorespiratory fitness.           Discharge Exercise Prescription (Final Exercise Prescription Changes):     Exercise Prescription Changes - 03/27/16 1600      Response to Exercise   Blood Pressure (Admit) 162/82  recheck 144/74   Blood Pressure (Exercise) 140/82   Blood Pressure (Exit) 142/80   Heart Rate (Admit) 89 bpm   Heart Rate (Exercise) 97 bpm   Heart Rate (Exit) 89 bpm   Rating of Perceived Exertion (Exercise) 13   Symptoms none   Comments Reviewed HEP on 02/11/16   Duration Progress to 30 minutes of  aerobic without signs/symptoms of physical distress   Intensity THRR unchanged     Progression   Progression Continue to progress workloads to maintain intensity without signs/symptoms of physical distress.   Average METs 2.3     Resistance Training   Training Prescription Yes   Weight 2lbs   Reps 10-15   Time 10 Minutes     Interval Training   Interval Training No     NuStep   Level 4   Minutes 20   METs 2.2     Arm Ergometer   Level 1   Minutes 10   METs 2.7     Track   Laps --   Minutes --   METs --     Home Exercise Plan   Plans to continue exercise at Home (comment)  HEP reviewed on 02/11/16   Frequency Add 2 additional days to program exercise sessions.     Exercise Review   Progression Yes      Nutrition:  Target Goals: Understanding of nutrition guidelines, daily intake of sodium '1500mg'$ , cholesterol '200mg'$ , calories 30% from fat and 7% or less from saturated fats, daily to have 5 or more servings of fruits and vegetables.  Biometrics:     Pre Biometrics - 01/18/16 1618      Pre Biometrics   Height 5' (1.524 m)   Weight 172 lb 6.4 oz  (78.2 kg)   Waist Circumference 36.75 inches   Hip Circumference 46.25 inches   Waist to Hip Ratio 0.79 %   BMI (Calculated) 33.7   Triceps Skinfold 40 mm   % Body Fat 46.3 %   Grip Strength 25 kg   Flexibility 12 in   Single Leg Stand  3.21 seconds       Nutrition Therapy Plan and Nutrition Goals:     Nutrition Therapy & Goals - 03/13/16 0917      Nutrition Therapy   Diet Therapeutic Lifestyle Changes     Personal Nutrition Goals   Nutrition Goal 1-2 lb wt loss/week to a wt loss goal of 6-24 lb at graduation from [facility], educate and counsel regarding individualized specific dietary modifications aiming towards targeted core components such as weight, hypertension, lipid management, diabetes, heart failure and other comorbidities.   Expected Outcomes Short Term Goal: Understand basic principles of dietary content, such as calories, fat, sodium, cholesterol and nutrients.;Long Term Goal: Adherence to prescribed nutrition plan.      Nutrition Discharge: Nutrition Scores:     Nutrition Assessments - 03/13/16 0917      MEDFICTS Scores   Pre Score 15      Nutrition Goals Re-Evaluation:   Nutrition Goals Re-Evaluation:   Nutrition Goals Discharge (Final Nutrition Goals Re-Evaluation):   Psychosocial: Target Goals: Acknowledge presence or absence of significant depression and/or stress, maximize coping skills, provide positive support system. Participant is able to verbalize types and ability to use techniques and skills needed for reducing stress and depression.  Initial Review & Psychosocial Screening:     Initial Psych Review & Screening - 01/18/16 [place]? Yes   Comments --  brief assessment reveals no further psychosocial assesment needed at this time      Quality of Life Scores:     Quality of Life - 01/26/16 1148      Quality of Life Scores    Health/Function Pre --  dyspnea and fatigue are pt biggest concerns   GLOBAL Pre --  overall scores are satisfactory.  will continue to monitor      PHQ-9: Recent Review Flowsheet Data    Depression screen [facility] 2/9 01/26/2016   Decreased Interest 0   Down, Depressed, Hopeless 0   PHQ - 2 Score 0     Interpretation of Total Score  Total Score Depression Severity:  1-4 = Minimal depression, 5-9 = Mild depression, 10-14 = Moderate depression, 15-19 = Moderately severe depression, 20-27 = Severe depression   Psychosocial Evaluation and Intervention:     Psychosocial Evaluation - 01/26/16 1145      Psychosocial Evaluation & Interventions   Interventions Encouraged to exercise with the program and follow exercise prescription;Relaxation education   Comments no psychosocial needs identified, no intervention necessary    Continue Psychosocial Services  No      Psychosocial Re-Evaluation:     Psychosocial Re-Evaluation    [place] Name 02/01/16 0818 02/28/16 9767 03/29/16 0744         Psychosocial Re-Evaluation   Current issues with  -  - (P)  Current Stress Concerns     Comments no psychosocial needs identified, no interventions necessary  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 pt has neuro consult scheduled.  pt has been cautioned per her retina specialist to decrease workloads to avoid overheating with exercise. will conitnue to monitor.  otherwise no psychosocial needs identified, no interventions necessary.  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 pt has neuro consult scheduled.  pt has been cautioned per her retina specialist to decrease workloads to avoid overheating with exercise. will conitnue to monitor.  otherwise no  psychosocial needs identified, no interventions necessary.      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 pt has neuro consult scheduled.  pt has been cautioned per her retina specialist to decrease workloads to avoid overheating with exercise. will conitnue to monitor.  otherwise no psychosocial needs identified, no interventions necessary.       Vocational Rehabilitation: Provide vocational rehab assistance to qualifying candidates.   Vocational Rehab Evaluation & Intervention:     Vocational Rehab - 01/18/16 1747      Initial Vocational Rehab Evaluation & Intervention   Assessment shows need for Vocational Rehabilitation No  Mrs Espiritu is retired and is not interested in vocational rehab at this time.      Education: Education Goals: Education classes will be provided on a weekly basis, covering required topics. Participant will state understanding/return demonstration of topics presented.  Learning Barriers/Preferences:     Learning Barriers/Preferences - 01/18/16 1352      Learning Barriers/Preferences   Learning Barriers Sight   Learning Preferences Pictoral;Computer/Internet      Education Topics: Count Your Pulse:  -Group instruction provided by verbal instruction, demonstration, patient participation and written materials to support subject.  Instructors address importance of being able to find your pulse and how to count your pulse when at home without a heart monitor.  Patients get hands on experience counting their pulse with staff help and individually.   Heart Attack, Angina, and Risk Factor Modification:  -Group instruction provided by verbal instruction, video, and written materials to support  subject.  Instructors address signs and symptoms of angina and heart attacks.    Also discuss risk factors for heart disease and how to make changes to improve heart health risk factors. Flowsheet Row CARDIAC REHAB PHASE II EXERCISE from 03/29/2016 in [place]  Date  02/23/16  Instruction Review Code  2- meets goals/outcomes      Functional Fitness:  -Group instruction provided by verbal instruction, demonstration, patient participation, and written materials to support subject.  Instructors address safety measures for doing things around the house.  Discuss how to get up and down off the floor, how to pick things up properly, how to safely get out of a chair without assistance, and balance training.   Meditation and Mindfulness:  -Group instruction provided by verbal instruction, patient participation, and written materials to support subject.  Instructor addresses importance of mindfulness and meditation practice to help reduce stress and improve awareness.  Instructor also leads participants through a meditation exercise.  Flowsheet Row CARDIAC REHAB PHASE II EXERCISE from 03/29/2016 in [place]  Date  03/22/16  Instruction Review Code  2- meets goals/outcomes      Stretching for Flexibility and Mobility:  -Group instruction provided by verbal instruction, patient participation, and written materials to support subject.  Instructors lead participants through series of stretches that are designed to increase flexibility thus improving mobility.  These stretches are additional exercise for major muscle groups that are typically performed during  regular warm up and cool down. Flowsheet Row CARDIAC REHAB PHASE II EXERCISE from 03/29/2016 in [place]  Date  03/24/16  Instruction Review Code  1- partially meets, needs review/practice      Hands Only CPR Anytime:  -Group instruction provided by  verbal instruction, video, patient participation and written materials to support subject.  Instructors co-teach with [facility] video for hands only CPR.  Participants get hands on experience with mannequins.   Nutrition I class: Heart Healthy Eating:  -Group instruction provided by PowerPoint slides, verbal discussion, and written materials to support subject matter. The instructor gives an explanation and review of the Therapeutic Lifestyle Changes diet recommendations, which includes a discussion on lipid goals, dietary fat, sodium, fiber, plant stanol/sterol esters, sugar, and the components of a well-balanced, healthy diet.   Nutrition II class: Lifestyle Skills:  -Group instruction provided by PowerPoint slides, verbal discussion, and written materials to support subject matter. The instructor gives an explanation and review of label reading, grocery shopping for heart health, heart healthy recipe modifications, and ways to make healthier choices when eating out.   Diabetes Question & Answer:  -Group instruction provided by PowerPoint slides, verbal discussion, and written materials to support subject matter. The instructor gives an explanation and review of diabetes co-morbidities, pre- and post-prandial blood glucose goals, pre-exercise blood glucose goals, signs, symptoms, and treatment of hypoglycemia and hyperglycemia, and foot care basics. Flowsheet Row CARDIAC REHAB PHASE II EXERCISE from 03/29/2016 in [place]  Date  02/11/16  Educator  RD  Instruction Review Code  2- meets goals/outcomes      Diabetes Blitz:  -Group instruction provided by PowerPoint slides, verbal discussion, and written materials to support subject matter. The instructor gives an explanation and review of the physiology behind type 1 and type 2 diabetes, diabetes medications and rational behind using different medications, pre- and post-prandial blood glucose recommendations and  Hemoglobin A1c goals, diabetes diet, and exercise including blood glucose guidelines for exercising safely.    Portion Distortion:  -Group instruction provided by PowerPoint slides, verbal discussion, written materials, and food models to support subject matter. The instructor gives an explanation of serving size versus portion size, changes in portions sizes over the last 20 years, and what consists of a serving from each food group. Flowsheet Row CARDIAC REHAB PHASE II EXERCISE from 03/29/2016 in [place]  Date  03/29/16  Educator  RD  Instruction Review Code  2- meets goals/outcomes      Stress Management:  -Group instruction provided by verbal instruction, video, and written materials to support subject matter.  Instructors review role of stress in heart disease and how to cope with stress positively.     Exercising on Your Own:  -Group instruction provided by verbal instruction, power point, and written materials to support subject.  Instructors discuss benefits of exercise, components of exercise, frequency and intensity of exercise, and end points for exercise.  Also discuss use of nitroglycerin and activating EMS.  Review options of places to exercise outside of rehab.  Review guidelines for sex with heart disease.   Cardiac Drugs I:  -Group instruction provided by verbal instruction and written materials to support subject.  Instructor reviews cardiac drug classes: antiplatelets, anticoagulants, beta blockers, and statins.  Instructor discusses reasons, side effects, and lifestyle considerations for each drug class. Flowsheet Row CARDIAC REHAB PHASE II EXERCISE from 03/29/2016 in [place]  Date  03/15/16  Instruction Review Code  2- meets goals/outcomes      Cardiac Drugs II:  -Group instruction provided by verbal instruction and written materials to support subject.  Instructor reviews cardiac drug classes:  angiotensin converting enzyme inhibitors (ACE-I), angiotensin II receptor blockers (ARBs), nitrates, and calcium channel blockers.  Instructor discusses reasons, side effects, and lifestyle considerations for each drug class. Flowsheet Row CARDIAC REHAB PHASE II EXERCISE from 03/29/2016 in [place]  Date  02/09/16  Instruction Review Code  2- meets goals/outcomes      Anatomy and Physiology of the Circulatory System:  -Group instruction provided by verbal instruction, video, and written materials to support subject.  Reviews functional anatomy of heart, how it relates to various diagnoses, and what role the heart plays in the overall system.   Knowledge Questionnaire Score:     Knowledge Questionnaire Score - 01/18/16 1359      Knowledge Questionnaire Score   Pre Score 20/24      Core Components/Risk Factors/Patient Goals at Admission:     Personal Goals and Risk Factors at Admission - 01/18/16 1352      Core Components/Risk Factors/Patient Goals on Admission   Sedentary Yes   Intervention Provide advice, education, support and counseling about physical activity/exercise needs.;Develop an individualized exercise prescription for aerobic and resistive training based on initial evaluation findings, risk stratification, comorbidities and participant's personal goals.   Expected Outcomes Achievement of increased cardiorespiratory fitness and enhanced flexibility, muscular endurance and strength shown through measurements of functional capacity and personal statement of participant.   Lipids Yes   Intervention Provide education and support for participant on nutrition & aerobic/resistive exercise along with prescribed medications to achieve LDL <39m, HDL >460m   Expected Outcomes Short Term: Participant states understanding of desired cholesterol values and is compliant with medications prescribed. Participant is following exercise prescription and  nutrition guidelines.;Long Term: Cholesterol controlled with medications as prescribed, with individualized exercise RX and with personalized nutrition plan. Value goals: LDL < 7028mHDL > 40 mg.   Stress Yes   Intervention Offer individual and/or small group education and counseling on adjustment to heart disease, stress management and health-related lifestyle change. Teach and support self-help strategies.;Refer participants experiencing significant psychosocial distress to appropriate mental health specialists for further evaluation and treatment. When possible, include family members and significant others in education/counseling sessions.   Expected Outcomes Long Term: Emotional wellbeing is indicated by absence of clinically significant psychosocial distress or social isolation.;Short Term: Participant demonstrates changes in health-related behavior, relaxation and other stress management skills, ability to obtain effective social support, and compliance with psychotropic medications if prescribed.      Core Components/Risk Factors/Patient Goals Review:      Goals and Risk Factor Review    Row Name 03/02/16 0751 03/02/16 0755 03/31/16 1459 03/31/16 1500       Core Components/Risk Factors/Patient Goals Review   Personal Goals Review Other  - Weight Management/Obesity;Lipids Weight Management/Obesity;Lipids;Stress    Review Pt feels good and has increased stamina. Has some complications with eyes due to macular degeneration. Discussed not over doing it with exercise and avoid heavy lifting. Pt feels good and has increased stamina. 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 Discussed not over doing it with exercise and avoid heavy lifting.  -  -    Expected Outcomes Pt will continue to tolerate exercise without difficulty or complications and have increased stamina.  -  - Pt will  continue to tolerate exercise without difficulty or complications and have increased stamina.   pt will continue physician direction for lipid management and attend nutrition/risk factor reduction education classes to learn ideal low fat, low cholesterol meal planning with stress management techniques for decreased health related anxiety.       Core Components/Risk Factors/Patient Goals at Discharge (Final Review):      Goals and Risk Factor Review - 03/31/16 1500      Core Components/Risk Factors/Patient Goals Review   Personal Goals Review Weight Management/Obesity;Lipids;Stress   Expected Outcomes Pt will continue to tolerate exercise without difficulty or complications and have increased stamina.  pt will continue physician direction for lipid management and attend nutrition/risk factor reduction education classes to learn ideal low fat, low cholesterol meal planning with stress management techniques for decreased health related anxiety.      ITP Comments:     ITP Comments    Row Name 01/18/16 1351 01/28/16 0826         ITP Comments Dr. Fransico Him, Medical Director Attended CPR education class, Met outcomes/goals          Comments: Pt is making expected progress toward personal goals after completing 18 sessions. Recommend continued exercise and life style modification education including  stress management and relaxation techniques to decrease cardiac risk profile.

## 2016-04-03 ENCOUNTER — Encounter (HOSPITAL_COMMUNITY)
Admission: RE | Admit: 2016-04-03 | Discharge: 2016-04-03 | Disposition: A | Discharge status: Home / Self Care | Payer: Medicare Other | Source: Ambulatory Visit | Attending: Cardiology | Admitting: Cardiology

## 2016-04-03 DIAGNOSIS — I5032 Chronic diastolic (congestive) heart failure: Secondary | ICD-10-CM | POA: Insufficient documentation

## 2016-04-03 DIAGNOSIS — Z79899 Other long term (current) drug therapy: Secondary | ICD-10-CM | POA: Insufficient documentation

## 2016-04-03 DIAGNOSIS — Z7901 Long term (current) use of anticoagulants: Secondary | ICD-10-CM | POA: Insufficient documentation

## 2016-04-03 DIAGNOSIS — Z952 Presence of prosthetic heart valve: Secondary | ICD-10-CM | POA: Diagnosis not present

## 2016-04-03 DIAGNOSIS — I4891 Unspecified atrial fibrillation: Secondary | ICD-10-CM | POA: Insufficient documentation

## 2016-04-03 DIAGNOSIS — Z9889 Other specified postprocedural states: Secondary | ICD-10-CM

## 2016-04-03 DIAGNOSIS — I421 Obstructive hypertrophic cardiomyopathy: Secondary | ICD-10-CM | POA: Diagnosis not present

## 2016-04-03 DIAGNOSIS — E7800 Pure hypercholesterolemia, unspecified: Secondary | ICD-10-CM | POA: Diagnosis not present

## 2016-04-03 DIAGNOSIS — Z87891 Personal history of nicotine dependence: Secondary | ICD-10-CM | POA: Diagnosis not present

## 2016-04-03 DIAGNOSIS — Z8679 Personal history of other diseases of the circulatory system: Secondary | ICD-10-CM

## 2016-04-04 DIAGNOSIS — H2513 Age-related nuclear cataract, bilateral: Secondary | ICD-10-CM | POA: Diagnosis not present

## 2016-04-04 DIAGNOSIS — H4601 Optic papillitis, right eye: Secondary | ICD-10-CM | POA: Diagnosis not present

## 2016-04-05 ENCOUNTER — Encounter (HOSPITAL_COMMUNITY): Payer: Medicare Other

## 2016-04-07 ENCOUNTER — Encounter (HOSPITAL_COMMUNITY): Payer: Medicare Other

## 2016-04-10 ENCOUNTER — Encounter (HOSPITAL_COMMUNITY): Payer: Medicare Other

## 2016-04-12 ENCOUNTER — Encounter (HOSPITAL_COMMUNITY): Payer: Medicare Other

## 2016-04-14 ENCOUNTER — Encounter (HOSPITAL_COMMUNITY): Payer: Medicare Other

## 2016-04-17 ENCOUNTER — Encounter (HOSPITAL_COMMUNITY): Payer: Medicare Other

## 2016-04-19 ENCOUNTER — Encounter (HOSPITAL_COMMUNITY): Payer: Medicare Other

## 2016-04-21 ENCOUNTER — Encounter (HOSPITAL_COMMUNITY): Admission: RE | Admit: 2016-04-21 | Payer: Medicare Other | Source: Ambulatory Visit

## 2016-04-24 ENCOUNTER — Encounter (HOSPITAL_COMMUNITY): Payer: Medicare Other

## 2016-04-24 DIAGNOSIS — H4602 Optic papillitis, left eye: Secondary | ICD-10-CM | POA: Diagnosis not present

## 2016-04-25 NOTE — Progress Notes (Signed)
Discharge Summary  Patient Details  Name: Melinda Gonzales MRN: [id number] Date of Birth: 01-21-1935 Referring Provider:     Randall from 01/18/2016 in [place]  Referring Provider  Peter, Martinique, MD       Number of Visits: 19   Reason for Discharge:  Early Exit:  Personal; vision loss from progressive temporal arteritis.   Smoking History:  History  Smoking Status  . Former Smoker  . Quit date: 05/26/1965  Smokeless Tobacco  . Never Used    Diagnosis:  No diagnosis found.  ADL UCSD:   Initial Exercise Prescription:     Initial Exercise Prescription - 01/18/16 1600      Date of Initial Exercise RX and Referring Provider   Date 01/18/16   Referring Provider Peter, Martinique, MD     Recumbant Bike   Level 1   Minutes 10   METs 1     NuStep   Level 1   Minutes 10   METs 1.3     Track   Laps 6   Minutes 10   METs 2.03     Prescription Details   Frequency (times per week) 3   Duration Progress to 30 minutes of continuous aerobic without signs/symptoms of physical distress     Intensity   THRR 40-80% of Max Heartrate 56-113   Ratings of Perceived Exertion 11-13   Perceived Dyspnea 0-4     Progression   Progression Continue progressive overload as per policy without signs/symptoms or physical distress.     Resistance Training   Training Prescription Yes   Weight 1lb   Reps 10-12      Discharge Exercise Prescription (Final Exercise Prescription Changes):     Exercise Prescription Changes - 04/11/16 1500      Response to Exercise   Blood Pressure (Admit) 136/74  recheck 144/74   Blood Pressure (Exercise) 142/80   Blood Pressure (Exit) 124/80   Heart Rate (Admit) 87 bpm   Heart Rate (Exercise) 94 bpm   Heart Rate (Exit) 87 bpm   Rating of Perceived Exertion (Exercise) 13   Symptoms none   Comments Reviewed HEP on 02/11/16   Duration Progress to 30 minutes of  aerobic without  signs/symptoms of physical distress   Intensity THRR unchanged     Progression   Progression Continue to progress workloads to maintain intensity without signs/symptoms of physical distress.   Average METs 2.2     Resistance Training   Training Prescription Yes   Weight 2lbs   Reps 10-15   Time 10 Minutes     Interval Training   Interval Training No     NuStep   Level 4   Minutes 10   METs 2     Arm Ergometer   Level 1   Minutes 10   METs 2.62     Track   Laps 6   Minutes 10   METs 2.03     Home Exercise Plan   Plans to continue exercise at Home (comment)  HEP reviewed on 02/11/16   Frequency Add 2 additional days to program exercise sessions.     Exercise Review   Progression Yes      Functional Capacity:     6 Minute Walk    Row Name 01/18/16 1621         6 Minute Walk   Phase Initial     Distance 800 feet  Walk Time 6 minutes     # of Rest Breaks 0     MPH 1.5     METS 0.92     RPE 13     VO2 Peak 3.2     Symptoms Yes (comment)     Comments fatigue     Resting HR 54 bpm     Resting BP 112/70     Max Ex. HR 76 bpm     Max Ex. BP 140/76     2 Minute Post BP 146/60  recheck 118/68        Psychological, QOL, Others - Outcomes: PHQ 2/9: Depression screen PHQ 2/9 01/26/2016  Decreased Interest 0  Down, Depressed, Hopeless 0  PHQ - 2 Score 0    Quality of Life:     Quality of Life - 01/26/16 1148      Quality of Life Scores   Health/Function Pre --  dyspnea and fatigue are pt biggest concerns   GLOBAL Pre --  overall scores are satisfactory.  will continue to monitor      Personal Goals: Goals established at orientation with interventions provided to work toward goal.     Personal Goals and Risk Factors at Admission - 01/18/16 1352      Core Components/Risk Factors/Patient Goals on Admission   Sedentary Yes   Intervention Provide advice, education, support and counseling about physical activity/exercise needs.;Develop an  individualized exercise prescription for aerobic and resistive training based on initial evaluation findings, risk stratification, comorbidities and participant's personal goals.   Expected Outcomes Achievement of increased cardiorespiratory fitness and enhanced flexibility, muscular endurance and strength shown through measurements of functional capacity and personal statement of participant.   Lipids Yes   Intervention Provide education and support for participant on nutrition & aerobic/resistive exercise along with prescribed medications to achieve LDL 70mg , HDL >40mg .   Expected Outcomes Short Term: Participant states understanding of desired cholesterol values and is compliant with medications prescribed. Participant is following exercise prescription and nutrition guidelines.;Long Term: Cholesterol controlled with medications as prescribed, with individualized exercise RX and with personalized nutrition plan. Value goals: LDL < 70mg , HDL > 40 mg.   Stress Yes   Intervention Offer individual and/or small group education and counseling on adjustment to heart disease, stress management and health-related lifestyle change. Teach and support self-help strategies.;Refer participants experiencing significant psychosocial distress to appropriate mental health specialists for further evaluation and treatment. When possible, include family members and significant others in education/counseling sessions.   Expected Outcomes Long Term: Emotional wellbeing is indicated by absence of clinically significant psychosocial distress or social isolation.;Short Term: Participant demonstrates changes in health-related behavior, relaxation and other stress management skills, ability to obtain effective social support, and compliance with psychotropic medications if prescribed.       Personal Goals Discharge:     Goals and Risk Factor Review    Row Name 03/02/16 0751 03/02/16 0755 03/31/16 1459 03/31/16 1500        Core Components/Risk Factors/Patient Goals Review   Personal Goals Review Other  - Weight Management/Obesity;Lipids Weight Management/Obesity;Lipids;Stress    Review Pt feels good and has increased stamina. Has some complications with eyes due to macular degeneration. Discussed not over doing it with exercise and avoid heavy lifting. Pt feels good and has increased stamina. 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 Discussed not over doing it with exercise and avoid heavy lifting.  -  -    Expected Outcomes Pt will continue  to tolerate exercise without difficulty or complications and have increased stamina.  -  - Pt will continue to tolerate exercise without difficulty or complications and have increased stamina.  pt will continue physician direction for lipid management and attend nutrition/risk factor reduction education classes to learn ideal low fat, low cholesterol meal planning with stress management techniques for decreased health related anxiety.       Nutrition & Weight - Outcomes:     Pre Biometrics - 01/18/16 1618      Pre Biometrics   Height 5' (1.524 m)   Weight 172 lb 6.4 oz (78.2 kg)   Waist Circumference 36.75 inches   Hip Circumference 46.25 inches   Waist to Hip Ratio 0.79 %   BMI (Calculated) 33.7   Triceps Skinfold 40 mm   % Body Fat 46.3 %   Grip Strength 25 kg   Flexibility 12 in   Single Leg Stand 3.21 seconds       Nutrition:     Nutrition Therapy & Goals - 03/13/16 0917      Nutrition Therapy   Diet Therapeutic Lifestyle Changes     Personal Nutrition Goals   Nutrition Goal 1-2 lb wt loss/week to a wt loss goal of 6-24 lb at graduation from [place], educate and counsel regarding individualized specific dietary modifications aiming towards targeted core components such as weight, hypertension, lipid management, diabetes, heart failure and other comorbidities.   Expected  Outcomes Short Term Goal: Understand basic principles of dietary content, such as calories, fat, sodium, cholesterol and nutrients.;Long Term Goal: Adherence to prescribed nutrition plan.      Nutrition Discharge:     Nutrition Assessments - 03/13/16 0917      MEDFICTS Scores   Pre Score 15      Education Questionnaire Score:     Knowledge Questionnaire Score - 01/18/16 1359      Knowledge Questionnaire Score   Pre Score 20/24      Goals reviewed with patient; copy given to patient.

## 2016-04-26 ENCOUNTER — Encounter (HOSPITAL_COMMUNITY)
Admission: RE | Admit: 2016-04-26 | Discharge: 2016-04-26 | Disposition: A | Discharge status: Home / Self Care | Payer: Medicare Other | Source: Ambulatory Visit

## 2016-04-28 ENCOUNTER — Encounter (HOSPITAL_COMMUNITY): Payer: Medicare Other

## 2016-05-01 ENCOUNTER — Encounter (HOSPITAL_COMMUNITY): Payer: Medicare Other

## 2016-05-01 DIAGNOSIS — H4722 Hereditary optic atrophy: Secondary | ICD-10-CM | POA: Diagnosis not present

## 2016-05-01 DIAGNOSIS — H53432 Sector or arcuate defects, left eye: Secondary | ICD-10-CM | POA: Diagnosis not present

## 2016-05-01 DIAGNOSIS — H47013 Ischemic optic neuropathy, bilateral: Secondary | ICD-10-CM | POA: Diagnosis not present

## 2016-05-01 DIAGNOSIS — H47291 Other optic atrophy, right eye: Secondary | ICD-10-CM | POA: Diagnosis not present

## 2016-05-01 DIAGNOSIS — H3530 Unspecified macular degeneration: Secondary | ICD-10-CM | POA: Diagnosis not present

## 2016-05-01 DIAGNOSIS — H47012 Ischemic optic neuropathy, left eye: Secondary | ICD-10-CM | POA: Diagnosis not present

## 2016-05-01 DIAGNOSIS — I7090 Unspecified atherosclerosis: Secondary | ICD-10-CM | POA: Diagnosis not present

## 2016-05-02 NOTE — Progress Notes (Signed)
Melinda Gonzales Date of Birth: [birth year]   History of Present Illness: Melinda Gonzales is seen for follow up of HOCM  She was evaluated in 2011 with a Myoview stress test which was normal. She had an Echo at that time that showed moderate LVH with EF 65-70%. Mild MR. She does have a history of murmur. She is not a smoker. In early 2016 she presented with increased dyspnea and an Echo and cardiac MRI were done. These with both consistent with HOCM. LVOT gradient of 112 mm Hg at rest. She was started on low dose lasix and Toprol XL with good response initially with improved dyspnea.  Seen last year with symptoms of increased dyspnea. No increased edema or orthopnea but more dyspnea with walking or lifting. No chest pain. No dizziness or palpitations. Energy level has decreased. Repeat Echo showed severe LVOT gradient and MR.  She underwent cardiac cath with findings of LVOT gradient and MR. No significant CAD. Pulmonary HTN. She was referred to the [facility] clinic and underwent surgery on 11/05/15. Prior to surgery she developed Afib and had a DCCV. Surgery  included a septal myectomy, MVR with a #29 mm St. Jude  Biocor valve, left sided Cryo MAZE, and ligation of the left atrial appendage. Her post op course was remarkable for paroxysmal atrial fibrillation and she was started on amiodarone and Xarelto.  Ecg showed a LBBB. Repeat Echo on 11/11/15 showed normal LV function with EF 60%. Mean MV gradient 8 mm Hg. Trace MR. Mild TR. There was a fixed LVOT gradient of 32 mm Hg peak and mean of 16 mm Hg. Repeat Echo 02/03/16 showed no LVOT gradient and normal MV prosthesis function.   On follow up today she is doing well from a cardiac standpoint. Her energy level has improved. She is walking 30 minutes twice a day. Her breathing is doing much better.    No chest pain. No palpitations or tachycardia.   She did develop loss of sight in right eye. Seen by Dr. Hassell Done at [facility] and had ischemic optic  neuropathy. thiough related to age +/- amiodarone. Symptoms have improved in right eye. Now some loss of vision in left eye due to macular degeneration.   Current Outpatient Prescriptions on File Prior to Visit  Medication Sig Dispense Refill  . acetaminophen (TYLENOL) 325 MG tablet Take 650 mg by mouth every 6 (six) hours as needed.    . metoprolol succinate (TOPROL-XL) 50 MG 24 hr tablet Take 1 tablet (50 mg total) by mouth daily. Take with or immediately following a meal. 90 tablet 3  . rosuvastatin (CRESTOR) 5 MG tablet Take 1 tablet (5 mg total) by mouth daily. 90 tablet 3   No current facility-administered medications on file prior to visit.     Allergies  Allergen Reactions  . Oxycodone Nausea And Vomiting    And the sweats    Past Medical History:  Diagnosis Date  . Chronic diastolic CHF (congestive heart failure) ([place]) 07/06/2015  . Dyspnea   . Heart murmur   . Hypercholesterolemia   . Hypertrophic obstructive cardiomyopathy (HOCM) ([place])   . LVH (left ventricular hypertrophy)    WITH NORMAL SYSTOLIC FUNCTION  . SOB (shortness of breath)     Past Surgical History:  Procedure Laterality Date  . CARDIAC CATHETERIZATION N/A 07/06/2015   Procedure: Right/Left Heart Cath and Coronary Angiography;  Surgeon: Alice Vitelli M Martinique, MD;  Location: [facility];  Service: Cardiovascular;  Laterality: N/A;  .  MITRAL VALVE REPLACEMENT N/A 11/15/2015   ligation of left atrial appendage, maze procedure at [facility]    History  Smoking Status  . Former Smoker  . Quit date: 05/26/1965  Smokeless Tobacco  . Never Used    History  Alcohol Use No    Family History  Problem Relation Age of Onset  . Heart failure Mother 20  . Heart attack Father 43    Review of Systems: As noted in HPI.   All other systems were reviewed and are negative.  Physical Exam: BP (!) 145/74 (BP Location: Right Arm)   Pulse 78   Ht 5' (1.524 m)   Wt 176 lb 3.2 oz (79.9 kg)   BMI 34.41 kg/m   She is an obese white female in no distress. Her HEENT exam is unremarkable. She has no JVD or bruits. Lungs are clear. Cardiac exam reveals a soft 4-6/5  systolic murmur at the  right upper sternal border radiating to the apex. There is no S3. Sternotomy site is healing well.Abdomen is soft and nontender. She has tr edema. Pedal pulses are excellent.  Neurologic exam is nonfocal. Body mass index is 34.41 kg/m.   LABORATORY DATA:   Lab Results  Component Value Date   WBC 7.1 02/03/2016   HGB 10.3 (L) 11/19/2015   HCT 38.3 02/03/2016   PLT 354 02/03/2016   GLUCOSE 117 (H) 02/24/2016   CHOL 196 02/24/2016   TRIG 115 02/24/2016   HDL 58 02/24/2016   LDLCALC 115 (H) 02/24/2016   ALT 14 11/19/2015   AST 19 11/19/2015   NA 145 (H) 02/24/2016   K 5.6 (H) 02/24/2016   CL 104 02/24/2016   CREATININE 1.37 (H) 02/24/2016   BUN 26 02/24/2016   CO2 21 02/24/2016   INR 1.03 07/06/2015   Labs dated 03/11/16: creatinine 1.6   Echo 02/03/16: Study Conclusions  - Left ventricle: The cavity size was normal. There was moderate   concentric hypertrophy. Systolic function was normal. The   estimated ejection fraction was in the range of 60% to 65%. Wall   motion was normal; there were no regional wall motion   abnormalities. The study is not technically sufficient to allow   evaluation of LV diastolic function due to the presence of a   bioprosthetic mitral valve. - Aortic valve: Valve mobility was mildly restricted. There was   mild stenosis. There was mild regurgitation. - Mitral valve: Mildly calcified annulus. A 28mm St. Jude biocor   bioprosthesis was present and functioning well. - Left atrium: The atrium was severely dilated. - Right ventricle: The cavity size was normal. Wall thickness was   normal. Systolic function was normal. - Tricuspid valve: There was mild regurgitation. - Pulmonary arteries: Systolic pressure was mildly increased. PA   peak pressure: 42 mm Hg  (S).  Impressions:  - Since the last echo 06/15/2015, a bioprosthetic mitral valve is   present and functioning well. Septal wall thickness is now 1.5 cm   compared with 1.8 cm pre-operatively.  Assessment / Plan: 1. HOCM. Classic morphologic features by Echo and MRI. MRI also shows some subendocardial scar consistent with HCM.  Initially improved on increased doses of Toprol and lasix but symptoms persisted.  Echo showed increased LVOT gradient on medication and more SAM and MR. She had evidence of pulmonary HTN- confirmed on cardiac cath with elevated LV filling pressures. Now s/p septal myectomy on 11/05/15. Excellent result by follow up Echo. No residual LVOT gradient. Continue  current therapy.  2. Severe MR s/p MVR with #29 St. Jude Biocor valve. Routine SBE prophylaxis. Normal function by Echo.  3.  NSVT Symptoms resolved on beta blocker.    4. Paroxysmal Atrial fibrillation post op open heart surgery. S/p left sided MAZE. Maintaining NSR. No longer on amiodarone. On Toprol XL 50 mg daily. Continue Xarelto.     5. Hypercholesterolemia.   6. Post op anemia. Improved.  7. Acute on chronic kidney disease stage 3. Creatinine recently increased 1.2>>1.47 >> 1.6. Off lasix.   I will follow up in 6 months.

## 2016-05-03 ENCOUNTER — Ambulatory Visit (INDEPENDENT_AMBULATORY_CARE_PROVIDER_SITE_OTHER): Payer: Medicare Other | Admitting: Cardiology

## 2016-05-03 ENCOUNTER — Encounter: Payer: Self-pay | Admitting: Cardiology

## 2016-05-03 ENCOUNTER — Encounter (HOSPITAL_COMMUNITY): Payer: Medicare Other

## 2016-05-03 VITALS — BP 145/74 | HR 78 | Ht 60.0 in | Wt 176.2 lb

## 2016-05-03 DIAGNOSIS — E7800 Pure hypercholesterolemia, unspecified: Secondary | ICD-10-CM | POA: Diagnosis not present

## 2016-05-03 DIAGNOSIS — I340 Nonrheumatic mitral (valve) insufficiency: Secondary | ICD-10-CM | POA: Diagnosis not present

## 2016-05-03 DIAGNOSIS — I421 Obstructive hypertrophic cardiomyopathy: Secondary | ICD-10-CM

## 2016-05-03 DIAGNOSIS — Z952 Presence of prosthetic heart valve: Secondary | ICD-10-CM | POA: Diagnosis not present

## 2016-05-03 DIAGNOSIS — I480 Paroxysmal atrial fibrillation: Secondary | ICD-10-CM

## 2016-05-03 DIAGNOSIS — I5032 Chronic diastolic (congestive) heart failure: Secondary | ICD-10-CM | POA: Diagnosis not present

## 2016-05-03 NOTE — Patient Instructions (Addendum)
Stop taking protonix  Continue your other therapy  I will see you in 6 months

## 2016-05-05 ENCOUNTER — Encounter (HOSPITAL_COMMUNITY): Payer: Medicare Other

## 2016-05-08 ENCOUNTER — Encounter (HOSPITAL_COMMUNITY): Payer: Medicare Other

## 2016-05-10 ENCOUNTER — Encounter (HOSPITAL_COMMUNITY): Payer: Medicare Other

## 2016-05-11 ENCOUNTER — Telehealth: Payer: Self-pay | Admitting: Cardiology

## 2016-05-11 NOTE — Telephone Encounter (Signed)
Based on her renal function and dosing for Afib the appropriate dose of Xarelto is 15 mg daily. Please change.  Peter Martinique MD, [facility]

## 2016-05-11 NOTE — Telephone Encounter (Signed)
Returned the phone call to the patient. She stated that her pharmacy still has the old prescription of Xarelto which was 20 mg daily. She now takes 10 mg daily. The pharmacy will not take it back and she said it's too expensive to get a new bottle at the corrected dose. She feels that it is too small to cut. She wants to know if she can take 20 mg every other day or if there is another solution.

## 2016-05-11 NOTE — Telephone Encounter (Signed)
New Message  Pt c/o medication issue:  1. Name of Medication: Xarelto   2. How are you currently taking this medication (dosage and times per day)? 10mg    3. Are you having a reaction (difficulty breathing--STAT)? No   4. What is your medication issue? Pt call requesting to speak with RN. Pt states she was giving 20mg  from the pharmacy and its suppose to be 10mg . Pt would like to know if she can take the medication every other day to satisfy the 10mg  daily. Please call back to discuss

## 2016-05-11 NOTE — Telephone Encounter (Signed)
Returned the phone call to the patient. Patient was unsure about whether to take 20 mg Xarelto or 10 mg. She was advised to continue taking the 20 mg until further notice by Dr. Martinique. There is no record of the prescription being changed to 10 mg. Will route to Dr. Martinique.

## 2016-05-11 NOTE — Telephone Encounter (Signed)
Is NOT appropriate to take xarelto every other day due to fast clearance from the body.   Who changed  her Xarelto to 10mg  daily?   Recommendation:   1. Call prescriber for Xarelto 10mg  and see if dose can be changed back to 20mg  daily  If unable to resume 20mg  Xarelto;  Check if they can provide samples for few days (we only have 15mg  and 20mg  samples available).

## 2016-05-12 ENCOUNTER — Encounter (HOSPITAL_COMMUNITY): Payer: Medicare Other

## 2016-05-15 ENCOUNTER — Encounter (HOSPITAL_COMMUNITY): Payer: Medicare Other

## 2016-05-15 MED ORDER — RIVAROXABAN 15 MG PO TABS: 15.0000 mg | ORAL_TABLET | Freq: Every day | ORAL | 6 refills | Status: DC

## 2016-05-15 NOTE — Telephone Encounter (Signed)
Returned call to patient Dr.Jordan's recommendations given. 

## 2016-05-17 ENCOUNTER — Encounter (HOSPITAL_COMMUNITY): Payer: Medicare Other

## 2016-05-19 ENCOUNTER — Encounter (HOSPITAL_COMMUNITY): Payer: Medicare Other

## 2016-05-22 ENCOUNTER — Encounter (HOSPITAL_COMMUNITY): Payer: Medicare Other

## 2016-05-24 ENCOUNTER — Encounter (HOSPITAL_COMMUNITY): Payer: Medicare Other

## 2016-05-30 DIAGNOSIS — H47291 Other optic atrophy, right eye: Secondary | ICD-10-CM | POA: Diagnosis not present

## 2016-05-30 DIAGNOSIS — L821 Other seborrheic keratosis: Secondary | ICD-10-CM | POA: Diagnosis not present

## 2016-05-30 DIAGNOSIS — H53432 Sector or arcuate defects, left eye: Secondary | ICD-10-CM | POA: Diagnosis not present

## 2016-05-30 DIAGNOSIS — D225 Melanocytic nevi of trunk: Secondary | ICD-10-CM | POA: Diagnosis not present

## 2016-05-30 DIAGNOSIS — H2513 Age-related nuclear cataract, bilateral: Secondary | ICD-10-CM | POA: Diagnosis not present

## 2016-05-30 DIAGNOSIS — L570 Actinic keratosis: Secondary | ICD-10-CM | POA: Diagnosis not present

## 2016-05-30 DIAGNOSIS — H47293 Other optic atrophy, bilateral: Secondary | ICD-10-CM | POA: Diagnosis not present

## 2016-05-30 DIAGNOSIS — B078 Other viral warts: Secondary | ICD-10-CM | POA: Diagnosis not present

## 2016-05-30 DIAGNOSIS — H3530 Unspecified macular degeneration: Secondary | ICD-10-CM | POA: Diagnosis not present

## 2016-05-30 DIAGNOSIS — H47013 Ischemic optic neuropathy, bilateral: Secondary | ICD-10-CM | POA: Diagnosis not present

## 2016-05-30 DIAGNOSIS — Z85828 Personal history of other malignant neoplasm of skin: Secondary | ICD-10-CM | POA: Diagnosis not present

## 2016-05-30 DIAGNOSIS — Z9889 Other specified postprocedural states: Secondary | ICD-10-CM | POA: Diagnosis not present

## 2016-06-06 DIAGNOSIS — I10 Essential (primary) hypertension: Secondary | ICD-10-CM | POA: Diagnosis not present

## 2016-06-06 DIAGNOSIS — H47013 Ischemic optic neuropathy, bilateral: Secondary | ICD-10-CM | POA: Diagnosis not present

## 2016-06-06 DIAGNOSIS — H35721 Serous detachment of retinal pigment epithelium, right eye: Secondary | ICD-10-CM | POA: Diagnosis not present

## 2016-06-06 DIAGNOSIS — H25813 Combined forms of age-related cataract, bilateral: Secondary | ICD-10-CM | POA: Diagnosis not present

## 2016-06-06 DIAGNOSIS — Z885 Allergy status to narcotic agent status: Secondary | ICD-10-CM | POA: Diagnosis not present

## 2016-06-06 DIAGNOSIS — Z87891 Personal history of nicotine dependence: Secondary | ICD-10-CM | POA: Diagnosis not present

## 2016-06-06 DIAGNOSIS — Z79899 Other long term (current) drug therapy: Secondary | ICD-10-CM | POA: Diagnosis not present

## 2016-06-06 DIAGNOSIS — Z7982 Long term (current) use of aspirin: Secondary | ICD-10-CM | POA: Diagnosis not present

## 2016-06-06 DIAGNOSIS — H439 Unspecified disorder of vitreous body: Secondary | ICD-10-CM | POA: Diagnosis not present

## 2016-06-06 DIAGNOSIS — Z9889 Other specified postprocedural states: Secondary | ICD-10-CM | POA: Diagnosis not present

## 2016-06-06 DIAGNOSIS — H353122 Nonexudative age-related macular degeneration, left eye, intermediate dry stage: Secondary | ICD-10-CM | POA: Diagnosis not present

## 2016-06-06 DIAGNOSIS — H2513 Age-related nuclear cataract, bilateral: Secondary | ICD-10-CM | POA: Diagnosis not present

## 2016-06-16 ENCOUNTER — Telehealth: Payer: Self-pay | Admitting: Cardiology

## 2016-06-16 MED ORDER — DILTIAZEM HCL ER COATED BEADS 180 MG PO CP24: 180.0000 mg | ORAL_CAPSULE | Freq: Every day | ORAL | 11 refills | Status: DC

## 2016-06-16 NOTE — Telephone Encounter (Signed)
New Message     Pt c/o medication issue:  1. Name of Medication: metoprolol succinate (TOPROL-XL) 50 MG 24 hr tablet Take 1 tablet (50 mg total) by mouth daily. Take with or immediately following a meal     2. How are you currently taking this medication (dosage and times per day)?  1 a day  3. Are you having a reaction (difficulty breathing--STAT)?   4. What is your medication issue? Having problem with vision  , would like something that will not effect her vision

## 2016-06-16 NOTE — Telephone Encounter (Signed)
We can try switching to Cardizem CD 180 mg daily.   Judie Hollick Martinique MD, [facility]

## 2016-06-16 NOTE — Telephone Encounter (Signed)
Received call back from patient.Dr.Jordan advised ok to stop Metoprolol and start Cardizem CD 180 mg daily.Advised to call back if needed.

## 2016-06-16 NOTE — Telephone Encounter (Signed)
Returned call to patient no answer.LMTC. 

## 2016-06-16 NOTE — Telephone Encounter (Signed)
S/w pt she states that she would like to change her Metoprolol she states that she read the paperwork and it states that this may cause eye issues and of has ischemic optic neuropathy and would like to  Change medication that does not have any eye side effects. Please advise

## 2016-07-27 DIAGNOSIS — I4891 Unspecified atrial fibrillation: Secondary | ICD-10-CM | POA: Diagnosis not present

## 2016-07-27 DIAGNOSIS — I421 Obstructive hypertrophic cardiomyopathy: Secondary | ICD-10-CM | POA: Diagnosis not present

## 2016-07-27 DIAGNOSIS — Y712 Prosthetic and other implants, materials and accessory cardiovascular devices associated with adverse incidents: Secondary | ICD-10-CM | POA: Diagnosis not present

## 2016-07-27 DIAGNOSIS — E669 Obesity, unspecified: Secondary | ICD-10-CM | POA: Diagnosis not present

## 2016-07-27 DIAGNOSIS — Z0000 Encounter for general adult medical examination without abnormal findings: Secondary | ICD-10-CM | POA: Diagnosis not present

## 2016-07-27 DIAGNOSIS — E7800 Pure hypercholesterolemia, unspecified: Secondary | ICD-10-CM | POA: Diagnosis not present

## 2016-07-27 DIAGNOSIS — N183 Chronic kidney disease, stage 3 (moderate): Secondary | ICD-10-CM | POA: Diagnosis not present

## 2016-07-27 DIAGNOSIS — Z1389 Encounter for screening for other disorder: Secondary | ICD-10-CM | POA: Diagnosis not present

## 2016-07-27 DIAGNOSIS — H547 Unspecified visual loss: Secondary | ICD-10-CM | POA: Diagnosis not present

## 2016-07-27 DIAGNOSIS — K580 Irritable bowel syndrome with diarrhea: Secondary | ICD-10-CM | POA: Diagnosis not present

## 2016-07-27 DIAGNOSIS — K219 Gastro-esophageal reflux disease without esophagitis: Secondary | ICD-10-CM | POA: Diagnosis not present

## 2016-08-07 DIAGNOSIS — Z1211 Encounter for screening for malignant neoplasm of colon: Secondary | ICD-10-CM | POA: Diagnosis not present

## 2016-09-27 ENCOUNTER — Other Ambulatory Visit: Payer: Self-pay

## 2016-10-05 DIAGNOSIS — H4602 Optic papillitis, left eye: Secondary | ICD-10-CM | POA: Diagnosis not present

## 2016-10-13 ENCOUNTER — Other Ambulatory Visit: Payer: Self-pay | Admitting: Cardiology

## 2016-10-13 ENCOUNTER — Encounter: Payer: Self-pay | Admitting: Cardiology

## 2016-10-20 NOTE — Progress Notes (Signed)
Melinda Gonzales Date of Birth: 1934/11/29   History of Present Illness: [name] is seen for follow up of HOCM  She was evaluated in 2011 with a Myoview stress test which was normal. She had an Echo at that time that showed moderate LVH with EF 65-70%. Mild MR. She does have a history of murmur. She is not a smoker. In early 2016 she presented with increased dyspnea and an Echo and cardiac MRI were Gonzales. These with both consistent with HOCM. LVOT gradient of 112 mm Hg at rest. She was started on low dose lasix and Toprol XL with good response initially with improved dyspnea.  Seen last year with symptoms of increased dyspnea. No increased edema or orthopnea but more dyspnea with walking or lifting. No chest pain. No dizziness or palpitations. Energy level has decreased. Repeat Echo showed severe LVOT gradient and MR.  She underwent cardiac cath with findings of LVOT gradient and MR. No significant CAD. Pulmonary HTN. She was referred to the [facility] clinic and underwent surgery on 11/05/15. Prior to surgery she developed Afib and had a DCCV. Surgery  included a septal myectomy, MVR with a #29 mm St. Jude  Biocor valve, left sided Cryo MAZE, and ligation of the left atrial appendage. Her post op course was remarkable for paroxysmal atrial fibrillation and she was started on amiodarone and Xarelto.  Ecg showed a LBBB. Repeat Echo on 11/11/15 showed normal LV function with EF 60%. Mean MV gradient 8 mm Hg. Trace MR. Mild TR. There was a fixed LVOT gradient of 32 mm Hg peak and mean of 16 mm Hg. Repeat Echo 02/03/16 showed no LVOT gradient and normal MV prosthesis function.   On follow up today she is doing well from a cardiac standpoint. Her energy level is good.  She notes some SOB.    No chest pain. No palpitations or tachycardia. She has not been exercising as much and has gained 5 lbs. States she loves to eat- particularly pasta.   She did develop loss of sight in right eye. Seen by Melinda Gonzales  at [facility] and had ischemic optic neuropathy. thiough related to age +/- amiodarone. Symptoms have improved in right eye. Now some loss of vision in left eye due to macular degeneration. She reports it is stable now.  Current Outpatient Prescriptions on File Prior to Visit  Medication Sig Dispense Refill  . acetaminophen (TYLENOL) 325 MG tablet Take 650 mg by mouth every 6 (six) hours as needed.    Marland Kitchen aspirin 81 MG chewable tablet Chew 162 mg by mouth daily.    Marland Kitchen diltiazem (CARDIZEM CD) 180 MG 24 hr capsule Take 1 capsule (180 mg total) by mouth daily. 30 capsule 11  . Multiple Vitamins-Minerals (PRESERVISION AREDS 2 PO) Take 1 capsule by mouth 2 (two) times daily.    . Rivaroxaban (XARELTO) 15 MG TABS tablet Take 1 tablet (15 mg total) by mouth daily with supper. 30 tablet 6  . rosuvastatin (CRESTOR) 5 MG tablet TAKE ONE TABLET BY MOUTH ONCE A DAY 30 tablet 0   No current facility-administered medications on file prior to visit.     Allergies  Allergen Reactions  . Oxycodone Nausea And Vomiting    And the sweats    Past Medical History:  Diagnosis Date  . Chronic diastolic CHF (congestive heart failure) ([place]) 07/06/2015  . Dyspnea   . Heart murmur   . Hypercholesterolemia   . Hypertrophic obstructive cardiomyopathy (HOCM) ([place])   .  LVH (left ventricular hypertrophy)    WITH NORMAL SYSTOLIC FUNCTION  . SOB (shortness of breath)     Past Surgical History:  Procedure Laterality Date  . CARDIAC CATHETERIZATION N/A 07/06/2015   Procedure: Right/Left Heart Cath and Coronary Angiography;  Surgeon: Melinda Tranchina M Martinique, MD;  Location: [place] [facility];  Service: Cardiovascular;  Laterality: N/A;  . MITRAL VALVE REPLACEMENT N/A 11/15/2015   ligation of left atrial appendage, maze procedure at [facility]    History  Smoking Status  . Former Smoker  . Quit date: 05/26/1965  Smokeless Tobacco  . Never Used    History  Alcohol Use No    Family History  Problem Relation Age of  Onset  . Heart failure Mother 74  . Heart attack Father 12    Review of Systems: As noted in HPI.   All other systems were reviewed and are negative.  Physical Exam: There were no vitals taken for this visit. She is an obese white female in no distress. Her HEENT exam is unremarkable. She has no JVD or bruits. Lungs are clear. Cardiac exam reveals a soft 2-5/3  systolic murmur at the  right upper sternal border radiating to the apex. There is no S3. Sternotomy site is healing well.Abdomen is soft and nontender. She has tr edema. Pedal pulses are excellent.  Neurologic exam is nonfocal. There is no height or weight on file to calculate BMI.   LABORATORY DATA:   Lab Results  Component Value Date   WBC 7.1 02/03/2016   HGB 12.1 02/03/2016   HCT 38.3 02/03/2016   PLT 354 02/03/2016   GLUCOSE 117 (H) 02/24/2016   CHOL 196 02/24/2016   TRIG 115 02/24/2016   HDL 58 02/24/2016   LDLCALC 115 (H) 02/24/2016   ALT 14 11/19/2015   AST 19 11/19/2015   NA 145 (H) 02/24/2016   K 5.6 (H) 02/24/2016   CL 104 02/24/2016   CREATININE 1.37 (H) 02/24/2016   BUN 26 02/24/2016   CO2 21 02/24/2016   INR 1.03 07/06/2015   Labs dated 03/11/16: creatinine 1.6 Dated 07/27/16: cholesterol 192, triglycerides 165, HDL 52, LDL 107. Creatinine 1.27. Potassium and ALT normal.    ECG today shows NSR with rate 72. LBBB. I have personally reviewed and interpreted this study.  Echo 02/03/16: Study Conclusions  - Left ventricle: The cavity size was normal. There was moderate   concentric hypertrophy. Systolic function was normal. The   estimated ejection fraction was in the range of 60% to 65%. Wall   motion was normal; there were no regional wall motion   abnormalities. The study is not technically sufficient to allow   evaluation of LV diastolic function due to the presence of a   bioprosthetic mitral valve. - Aortic valve: Valve mobility was mildly restricted. There was   mild stenosis. There was mild  regurgitation. - Mitral valve: Mildly calcified annulus. A 17mm St. Jude biocor   bioprosthesis was present and functioning well. - Left atrium: The atrium was severely dilated. - Right ventricle: The cavity size was normal. Wall thickness was   normal. Systolic function was normal. - Tricuspid valve: There was mild regurgitation. - Pulmonary arteries: Systolic pressure was mildly increased. PA   peak pressure: 42 mm Hg (S).  Impressions:  - Since the last echo 06/15/2015, a bioprosthetic mitral valve is   present and functioning well. Septal wall thickness is now 1.5 cm   compared with 1.8 cm pre-operatively.  Assessment /  Plan: 1. HOCM. Classic morphologic features by Echo and MRI. MRI also shows some subendocardial scar consistent with HCM.  Initially improved on increased doses of Toprol and lasix but symptoms persisted.  Echo showed increased LVOT gradient on medication and more SAM and MR. She had evidence of pulmonary HTN- confirmed on cardiac cath with elevated LV filling pressures. Now s/p septal myectomy on 11/05/15. Excellent result by follow up Echo. No residual LVOT gradient. Continue current therapy. Recommend repeat Echo in 6 months.  2. Severe MR s/p MVR with #29 St. Jude Biocor valve. Routine SBE prophylaxis. Normal function by Echo.  3.  NSVT   4. Paroxysmal Atrial fibrillation post op open heart surgery. S/p left sided MAZE. Maintaining NSR. No longer on amiodarone. On diltiazem. Continue Xarelto.     5. Hypercholesterolemia. On low dose Crestor.  6. CKD stage 2-3.   She needs to focus on increased aerobic activity and weight loss.    I will follow up in 6 months with Echo.

## 2016-10-26 ENCOUNTER — Ambulatory Visit (INDEPENDENT_AMBULATORY_CARE_PROVIDER_SITE_OTHER): Payer: Medicare Other | Admitting: Cardiology

## 2016-10-26 ENCOUNTER — Encounter: Payer: Self-pay | Admitting: Cardiology

## 2016-10-26 VITALS — BP 130/74 | HR 72 | Ht 61.0 in | Wt 181.0 lb

## 2016-10-26 DIAGNOSIS — Z952 Presence of prosthetic heart valve: Secondary | ICD-10-CM | POA: Diagnosis not present

## 2016-10-26 DIAGNOSIS — I480 Paroxysmal atrial fibrillation: Secondary | ICD-10-CM

## 2016-10-26 DIAGNOSIS — I421 Obstructive hypertrophic cardiomyopathy: Secondary | ICD-10-CM

## 2016-10-26 DIAGNOSIS — I5032 Chronic diastolic (congestive) heart failure: Secondary | ICD-10-CM

## 2016-10-26 NOTE — Patient Instructions (Addendum)
You need to work on getting daily exercise and lose weight.   Continue your current therapy  I will see you in 6 months with an Echocardiogram

## 2016-11-08 DIAGNOSIS — Z23 Encounter for immunization: Secondary | ICD-10-CM | POA: Diagnosis not present

## 2016-11-27 DIAGNOSIS — H04123 Dry eye syndrome of bilateral lacrimal glands: Secondary | ICD-10-CM | POA: Diagnosis not present

## 2016-11-27 DIAGNOSIS — H5340 Unspecified visual field defects: Secondary | ICD-10-CM | POA: Diagnosis not present

## 2016-11-27 DIAGNOSIS — H47011 Ischemic optic neuropathy, right eye: Secondary | ICD-10-CM | POA: Diagnosis not present

## 2016-11-27 DIAGNOSIS — H53432 Sector or arcuate defects, left eye: Secondary | ICD-10-CM | POA: Diagnosis not present

## 2016-11-27 DIAGNOSIS — H47291 Other optic atrophy, right eye: Secondary | ICD-10-CM | POA: Diagnosis not present

## 2016-11-27 DIAGNOSIS — H35721 Serous detachment of retinal pigment epithelium, right eye: Secondary | ICD-10-CM | POA: Diagnosis not present

## 2016-11-27 DIAGNOSIS — H353122 Nonexudative age-related macular degeneration, left eye, intermediate dry stage: Secondary | ICD-10-CM | POA: Diagnosis not present

## 2016-11-27 DIAGNOSIS — H25813 Combined forms of age-related cataract, bilateral: Secondary | ICD-10-CM | POA: Diagnosis not present

## 2016-11-27 DIAGNOSIS — H47012 Ischemic optic neuropathy, left eye: Secondary | ICD-10-CM | POA: Diagnosis not present

## 2016-12-15 ENCOUNTER — Other Ambulatory Visit: Payer: Self-pay | Admitting: Cardiology

## 2016-12-15 MED ORDER — ROSUVASTATIN CALCIUM 5 MG PO TABS: 5.0000 mg | ORAL_TABLET | Freq: Every day | ORAL | 3 refills | Status: DC

## 2016-12-15 NOTE — Telephone Encounter (Signed)
New Message      *STAT* If patient is at the pharmacy, call can be transferred to refill team.   1. Which medications need to be refilled? (please list name of each medication and dose if known)  rosuvastatin (CRESTOR) 5 MG tablet TAKE ONE TABLET BY MOUTH ONCE A DAY    2. Which pharmacy/location (including street and city if local pharmacy) is medication to be sent to[facility]  3. Do they need a 30 day or 90 day supply? [place]

## 2016-12-15 NOTE — Telephone Encounter (Signed)
Rx(s) sent to pharmacy electronically.  

## 2017-01-03 ENCOUNTER — Other Ambulatory Visit: Payer: Self-pay | Admitting: Cardiology

## 2017-02-05 ENCOUNTER — Other Ambulatory Visit: Payer: Self-pay | Admitting: Cardiology

## 2017-02-05 NOTE — Telephone Encounter (Signed)
Pt calling requesting a refills on Xarelto. Pt would like a call back. Please address

## 2017-02-07 DIAGNOSIS — I421 Obstructive hypertrophic cardiomyopathy: Secondary | ICD-10-CM | POA: Diagnosis not present

## 2017-02-07 DIAGNOSIS — E7800 Pure hypercholesterolemia, unspecified: Secondary | ICD-10-CM | POA: Diagnosis not present

## 2017-02-07 DIAGNOSIS — I420 Dilated cardiomyopathy: Secondary | ICD-10-CM | POA: Diagnosis not present

## 2017-02-07 DIAGNOSIS — I4891 Unspecified atrial fibrillation: Secondary | ICD-10-CM | POA: Diagnosis not present

## 2017-02-07 DIAGNOSIS — N183 Chronic kidney disease, stage 3 (moderate): Secondary | ICD-10-CM | POA: Diagnosis not present

## 2017-03-19 DIAGNOSIS — L570 Actinic keratosis: Secondary | ICD-10-CM | POA: Diagnosis not present

## 2017-03-19 DIAGNOSIS — Z85828 Personal history of other malignant neoplasm of skin: Secondary | ICD-10-CM | POA: Diagnosis not present

## 2017-04-04 DIAGNOSIS — H4602 Optic papillitis, left eye: Secondary | ICD-10-CM | POA: Diagnosis not present

## 2017-04-19 ENCOUNTER — Other Ambulatory Visit: Payer: Self-pay

## 2017-04-19 ENCOUNTER — Ambulatory Visit (HOSPITAL_COMMUNITY): Payer: Medicare Other | Attending: Cardiology

## 2017-04-19 DIAGNOSIS — I421 Obstructive hypertrophic cardiomyopathy: Secondary | ICD-10-CM | POA: Diagnosis not present

## 2017-04-19 DIAGNOSIS — I471 Supraventricular tachycardia: Secondary | ICD-10-CM | POA: Insufficient documentation

## 2017-04-19 DIAGNOSIS — I480 Paroxysmal atrial fibrillation: Secondary | ICD-10-CM | POA: Insufficient documentation

## 2017-04-19 DIAGNOSIS — I5032 Chronic diastolic (congestive) heart failure: Secondary | ICD-10-CM | POA: Diagnosis not present

## 2017-04-19 DIAGNOSIS — Z952 Presence of prosthetic heart valve: Secondary | ICD-10-CM | POA: Diagnosis not present

## 2017-04-19 DIAGNOSIS — R011 Cardiac murmur, unspecified: Secondary | ICD-10-CM | POA: Insufficient documentation

## 2017-04-19 DIAGNOSIS — I509 Heart failure, unspecified: Secondary | ICD-10-CM | POA: Diagnosis not present

## 2017-04-19 DIAGNOSIS — I4891 Unspecified atrial fibrillation: Secondary | ICD-10-CM | POA: Diagnosis not present

## 2017-04-19 DIAGNOSIS — E785 Hyperlipidemia, unspecified: Secondary | ICD-10-CM | POA: Diagnosis not present

## 2017-04-19 DIAGNOSIS — I422 Other hypertrophic cardiomyopathy: Secondary | ICD-10-CM | POA: Insufficient documentation

## 2017-04-26 NOTE — Progress Notes (Signed)
[name] Date of Birth: 1934-09-23   History of Present Illness: [name] is seen for follow up of HOCM  She was evaluated in 2011 with a Myoview stress test which was normal. She had an Echo at that time that showed moderate LVH with EF 65-70%. Mild MR. She does have a history of murmur. She is not a smoker. In early 2016 she presented with increased dyspnea and an Echo and cardiac MRI were done. These with both consistent with HOCM. LVOT gradient of 112 mm Hg at rest. She was started on low dose lasix and Toprol XL with good response initially with improved dyspnea.  Seen last year with symptoms of increased dyspnea. No increased edema or orthopnea but more dyspnea with walking or lifting. No chest pain. No dizziness or palpitations. Energy level has decreased. Repeat Echo showed severe LVOT gradient and MR.  She underwent cardiac cath with findings of LVOT gradient and MR. No significant CAD. Pulmonary HTN. She was referred to the [facility] clinic and underwent surgery on 11/05/15. Prior to surgery she developed Afib and had a DCCV. Surgery  included a septal myectomy, MVR with a #29 mm St. Jude  Biocor valve, left sided Cryo MAZE, and ligation of the left atrial appendage. Her post op course was remarkable for paroxysmal atrial fibrillation and she was started on amiodarone and Xarelto.  Ecg showed a LBBB. Repeat Echo on 11/11/15 showed normal LV function with EF 60%. Mean MV gradient 8 mm Hg. Trace MR. Mild TR. There was a fixed LVOT gradient of 32 mm Hg peak and mean of 16 mm Hg. Repeat Echo 02/03/16 showed no LVOT gradient and normal MV prosthesis function.   On follow up today she is doing well from a cardiac standpoint. She admits that she doesn't exercise and has gained weight. She denies any chest pain or dyspnea. No edema. Energy level is good. States she just "putters" around.   She no longer drives due to visual loss.    Current Outpatient Medications on File Prior to Visit   Medication Sig Dispense Refill  . aspirin 81 MG chewable tablet Chew 162 mg by mouth daily.    Marland Kitchen diltiazem (CARDIZEM CD) 180 MG 24 hr capsule Take 1 capsule (180 mg total) by mouth daily. 30 capsule 11  . Multiple Vitamins-Minerals (PRESERVISION AREDS 2 PO) Take 1 capsule by mouth 2 (two) times daily.    . rosuvastatin (CRESTOR) 5 MG tablet Take 1 tablet (5 mg total) daily by mouth. 90 tablet 3  . XARELTO 15 MG TABS tablet TAKE 1 TABLET (15 MG TOTAL) BY MOUTH DAILY WITH SUPPER. 30 tablet 2   No current facility-administered medications on file prior to visit.     Allergies  Allergen Reactions  . Oxycodone Nausea And Vomiting    And the sweats    Past Medical History:  Diagnosis Date  . Chronic diastolic CHF (congestive heart failure) ([place]) 07/06/2015  . Dyspnea   . Heart murmur   . Hypercholesterolemia   . Hypertrophic obstructive cardiomyopathy (HOCM) ([place])   . LVH (left ventricular hypertrophy)    WITH NORMAL SYSTOLIC FUNCTION  . SOB (shortness of breath)     Past Surgical History:  Procedure Laterality Date  . CARDIAC CATHETERIZATION N/A 07/06/2015   Procedure: Right/Left Heart Cath and Coronary Angiography;  Surgeon: Maeryn Mcgath M Martinique, MD;  Location: [place] [facility];  Service: Cardiovascular;  Laterality: N/A;  . MITRAL VALVE REPLACEMENT N/A 11/15/2015   ligation  of left atrial appendage, maze procedure at [place] [facility]  Smoking Status Former Smoker  . Last attempt to quit: 05/26/1965  . Years since quitting: 51.9  Smokeless Tobacco Never Used    Social History   Substance and Sexual Activity  Alcohol Use No    Family History  Problem Relation Age of Onset  . Heart failure Mother 79  . Heart attack Father 73    Review of Systems: As noted in HPI.   All other systems were reviewed and are negative.  Physical Exam: BP 130/70   Pulse (!) 58   Ht 5' 1.5" (1.562 m)   Wt 184 lb (83.5 kg)   BMI 34.20 kg/m   Body mass  index is 34.2 kg/m. GENERAL:  Well appearing obese WF in NAD HEENT:  PERRL, EOMI, sclera are clear. Oropharynx is clear. NECK:  No jugular venous distention, carotid upstroke brisk and symmetric, no bruits, no thyromegaly or adenopathy LUNGS:  Clear to auscultation bilaterally CHEST:  Unremarkable HEART:  RRR,  PMI not displaced or sustained,S1 and S2 within normal limits, no S3, no S4: no clicks, no rubs, gr 4-0/9 systolic murmur at the apex>> RUSB ABD:  Soft, nontender. BS +, no masses or bruits. No hepatomegaly, no splenomegaly EXT:  2 + pulses throughout, no edema, no cyanosis no clubbing SKIN:  Warm and dry.  No rashes NEURO:  Alert and oriented x 3. Cranial nerves II through XII intact. PSYCH:  Cognitively intact    LABORATORY DATA:   Lab Results  Component Value Date   WBC 7.1 02/03/2016   HGB 12.1 02/03/2016   HCT 38.3 02/03/2016   PLT 354 02/03/2016   GLUCOSE 117 (H) 02/24/2016   CHOL 196 02/24/2016   TRIG 115 02/24/2016   HDL 58 02/24/2016   LDLCALC 115 (H) 02/24/2016   ALT 14 11/19/2015   AST 19 11/19/2015   NA 145 (H) 02/24/2016   K 5.6 (H) 02/24/2016   CL 104 02/24/2016   CREATININE 1.37 (H) 02/24/2016   BUN 26 02/24/2016   CO2 21 02/24/2016   INR 1.03 07/06/2015   Labs dated 03/11/16: creatinine 1.6 Dated 07/27/16: cholesterol 192, triglycerides 165, HDL 52, LDL 107. Creatinine 1.27. Potassium and ALT normal.  Dated 02/07/17: BUN 27, creatinine 1.25. Glucose 113. Other chemistries normal. Cholesterol 199, triglycerides 212, HDL 44, LDL 113.    Echo 02/03/16: Study Conclusions  - Left ventricle: The cavity size was normal. There was moderate   concentric hypertrophy. Systolic function was normal. The   estimated ejection fraction was in the range of 60% to 65%. Wall   motion was normal; there were no regional wall motion   abnormalities. The study is not technically sufficient to allow   evaluation of LV diastolic function due to the presence of a    bioprosthetic mitral valve. - Aortic valve: Valve mobility was mildly restricted. There was   mild stenosis. There was mild regurgitation. - Mitral valve: Mildly calcified annulus. A 61mm St. Jude biocor   bioprosthesis was present and functioning well. - Left atrium: The atrium was severely dilated. - Right ventricle: The cavity size was normal. Wall thickness was   normal. Systolic function was normal. - Tricuspid valve: There was mild regurgitation. - Pulmonary arteries: Systolic pressure was mildly increased. PA   peak pressure: 42 mm Hg (S).  Impressions:  - Since the last echo 06/15/2015, a bioprosthetic mitral valve is   present  and functioning well. Septal wall thickness is now 1.5 cm   compared with 1.8 cm pre-operatively.  Echo 04/19/17: Study Conclusions  - Valve surgery. Mitral valve replacement with a [employer]   porcine valve. 60mm Biocor bioprosthetic valve. - Left ventricle: The cavity size was normal. Wall thickness was   increased in a pattern of mild LVH. Systolic function was normal.   The estimated ejection fraction was in the range of 55% to 60%.   Wall motion was normal; there were no regional wall motion   abnormalities. Doppler parameters are consistent with a   reversible restrictive pattern, indicative of decreased left   ventricular diastolic compliance and/or increased left atrial   pressure (grade 3 diastolic dysfunction). - Aortic valve: There was trivial regurgitation. Mean gradient (S):   11 mm Hg. Peak gradient (S): 19 mm Hg. - Mitral valve: A bioprosthesis was present. The prosthesis had a   normal range of motion. Mean gradient (D): 5 mm Hg. - Left atrium: The atrium was mildly dilated. - Pulmonary arteries: Systolic pressure was moderately increased.   PA peak pressure: 56 mm Hg (S).  Assessment / Plan: 1. HOCM.  Now s/p septal myectomy on 11/05/15. Excellent result by follow up Echo. No significant residual LVOT gradient. Continue  current therapy. No clinical evidence of CHF.  2. Severe MR s/p MVR with #29 St. Jude Biocor valve. Routine SBE prophylaxis. Normal function by Echo.  3.  NSVT   4. Paroxysmal Atrial fibrillation post op open heart surgery. S/p left sided MAZE. Maintaining NSR. No longer on amiodarone. On diltiazem. Continue Xarelto long term.     5. Hypercholesterolemia. On low dose Crestor. No history of CAD  6. CKD stage 2-3.   She needs to focus on increased aerobic activity and weight loss.    I will follow up in 6 months

## 2017-04-27 ENCOUNTER — Ambulatory Visit (INDEPENDENT_AMBULATORY_CARE_PROVIDER_SITE_OTHER): Payer: Medicare Other | Admitting: Cardiology

## 2017-04-27 ENCOUNTER — Encounter: Payer: Self-pay | Admitting: Cardiology

## 2017-04-27 VITALS — BP 130/70 | HR 58 | Ht 61.5 in | Wt 184.0 lb

## 2017-04-27 DIAGNOSIS — I421 Obstructive hypertrophic cardiomyopathy: Secondary | ICD-10-CM | POA: Diagnosis not present

## 2017-04-27 DIAGNOSIS — Z952 Presence of prosthetic heart valve: Secondary | ICD-10-CM | POA: Diagnosis not present

## 2017-04-27 DIAGNOSIS — I5032 Chronic diastolic (congestive) heart failure: Secondary | ICD-10-CM | POA: Diagnosis not present

## 2017-04-27 DIAGNOSIS — E7800 Pure hypercholesterolemia, unspecified: Secondary | ICD-10-CM | POA: Diagnosis not present

## 2017-04-27 NOTE — Patient Instructions (Signed)
Continue your current therapy  I will see you in 6 months  Get going with the exercise

## 2017-05-30 DIAGNOSIS — Z85828 Personal history of other malignant neoplasm of skin: Secondary | ICD-10-CM | POA: Diagnosis not present

## 2017-05-30 DIAGNOSIS — L570 Actinic keratosis: Secondary | ICD-10-CM | POA: Diagnosis not present

## 2017-05-30 DIAGNOSIS — L821 Other seborrheic keratosis: Secondary | ICD-10-CM | POA: Diagnosis not present

## 2017-05-30 DIAGNOSIS — D225 Melanocytic nevi of trunk: Secondary | ICD-10-CM | POA: Diagnosis not present

## 2017-06-04 ENCOUNTER — Other Ambulatory Visit: Payer: Self-pay | Admitting: Cardiology

## 2017-06-04 NOTE — Telephone Encounter (Signed)
°*  STAT* If patient is at the pharmacy, call can be transferred to refill team.   1. Which medications need to be refilled? (please list name of each medication and dose if known) Xarelto  2. Which pharmacy/location (including street and city if local pharmacy) is medication to be sent to?[place] [phone]  3. Do they need a 30 day or 90 day supply? 90 and refills

## 2017-06-05 ENCOUNTER — Telehealth: Payer: Self-pay | Admitting: Cardiology

## 2017-06-05 ENCOUNTER — Telehealth: Payer: Self-pay

## 2017-06-05 MED ORDER — RIVAROXABAN 15 MG PO TABS: ORAL_TABLET | ORAL | 6 refills | Status: DC

## 2017-06-05 NOTE — Telephone Encounter (Signed)
Received a call from [facility].Patient needs refill on Xarelto 15 mg.Advised ok to refill.

## 2017-06-05 NOTE — Telephone Encounter (Signed)
Received call from pharmacist at [facility].Patient needs Xarelto 15 mg refills.Advised ok to refill.

## 2017-06-05 NOTE — Telephone Encounter (Signed)
New Message:      Pharmacy is calling to confirm a request

## 2017-06-19 ENCOUNTER — Other Ambulatory Visit: Payer: Self-pay | Admitting: Cardiology

## 2017-06-19 NOTE — Telephone Encounter (Signed)
REFILL 

## 2017-06-20 ENCOUNTER — Other Ambulatory Visit: Payer: Self-pay

## 2017-06-20 MED ORDER — DILTIAZEM HCL ER COATED BEADS 180 MG PO CP24: 180.0000 mg | ORAL_CAPSULE | Freq: Every day | ORAL | 3 refills | Status: DC

## 2017-08-07 DIAGNOSIS — N183 Chronic kidney disease, stage 3 (moderate): Secondary | ICD-10-CM | POA: Diagnosis not present

## 2017-08-07 DIAGNOSIS — E7800 Pure hypercholesterolemia, unspecified: Secondary | ICD-10-CM | POA: Diagnosis not present

## 2017-08-07 DIAGNOSIS — H6120 Impacted cerumen, unspecified ear: Secondary | ICD-10-CM | POA: Diagnosis not present

## 2017-08-07 DIAGNOSIS — Z0000 Encounter for general adult medical examination without abnormal findings: Secondary | ICD-10-CM | POA: Diagnosis not present

## 2017-08-07 DIAGNOSIS — E669 Obesity, unspecified: Secondary | ICD-10-CM | POA: Diagnosis not present

## 2017-08-07 DIAGNOSIS — Z1389 Encounter for screening for other disorder: Secondary | ICD-10-CM | POA: Diagnosis not present

## 2017-08-07 DIAGNOSIS — K580 Irritable bowel syndrome with diarrhea: Secondary | ICD-10-CM | POA: Diagnosis not present

## 2017-08-07 DIAGNOSIS — I4891 Unspecified atrial fibrillation: Secondary | ICD-10-CM | POA: Diagnosis not present

## 2017-08-07 DIAGNOSIS — I421 Obstructive hypertrophic cardiomyopathy: Secondary | ICD-10-CM | POA: Diagnosis not present

## 2017-08-07 DIAGNOSIS — K219 Gastro-esophageal reflux disease without esophagitis: Secondary | ICD-10-CM | POA: Diagnosis not present

## 2017-08-07 DIAGNOSIS — H547 Unspecified visual loss: Secondary | ICD-10-CM | POA: Diagnosis not present

## 2017-10-10 DIAGNOSIS — H4602 Optic papillitis, left eye: Secondary | ICD-10-CM | POA: Diagnosis not present

## 2017-10-26 DIAGNOSIS — Z23 Encounter for immunization: Secondary | ICD-10-CM | POA: Diagnosis not present

## 2017-12-17 IMAGING — CR DG CHEST 2V
2 series · 2 of 2 positions shown · non-contrast
Comparison: Radiographs October 31, 2014.

CLINICAL DATA: Shortness of breath.

EXAM:
CHEST  2 VIEW

[w chest pa]
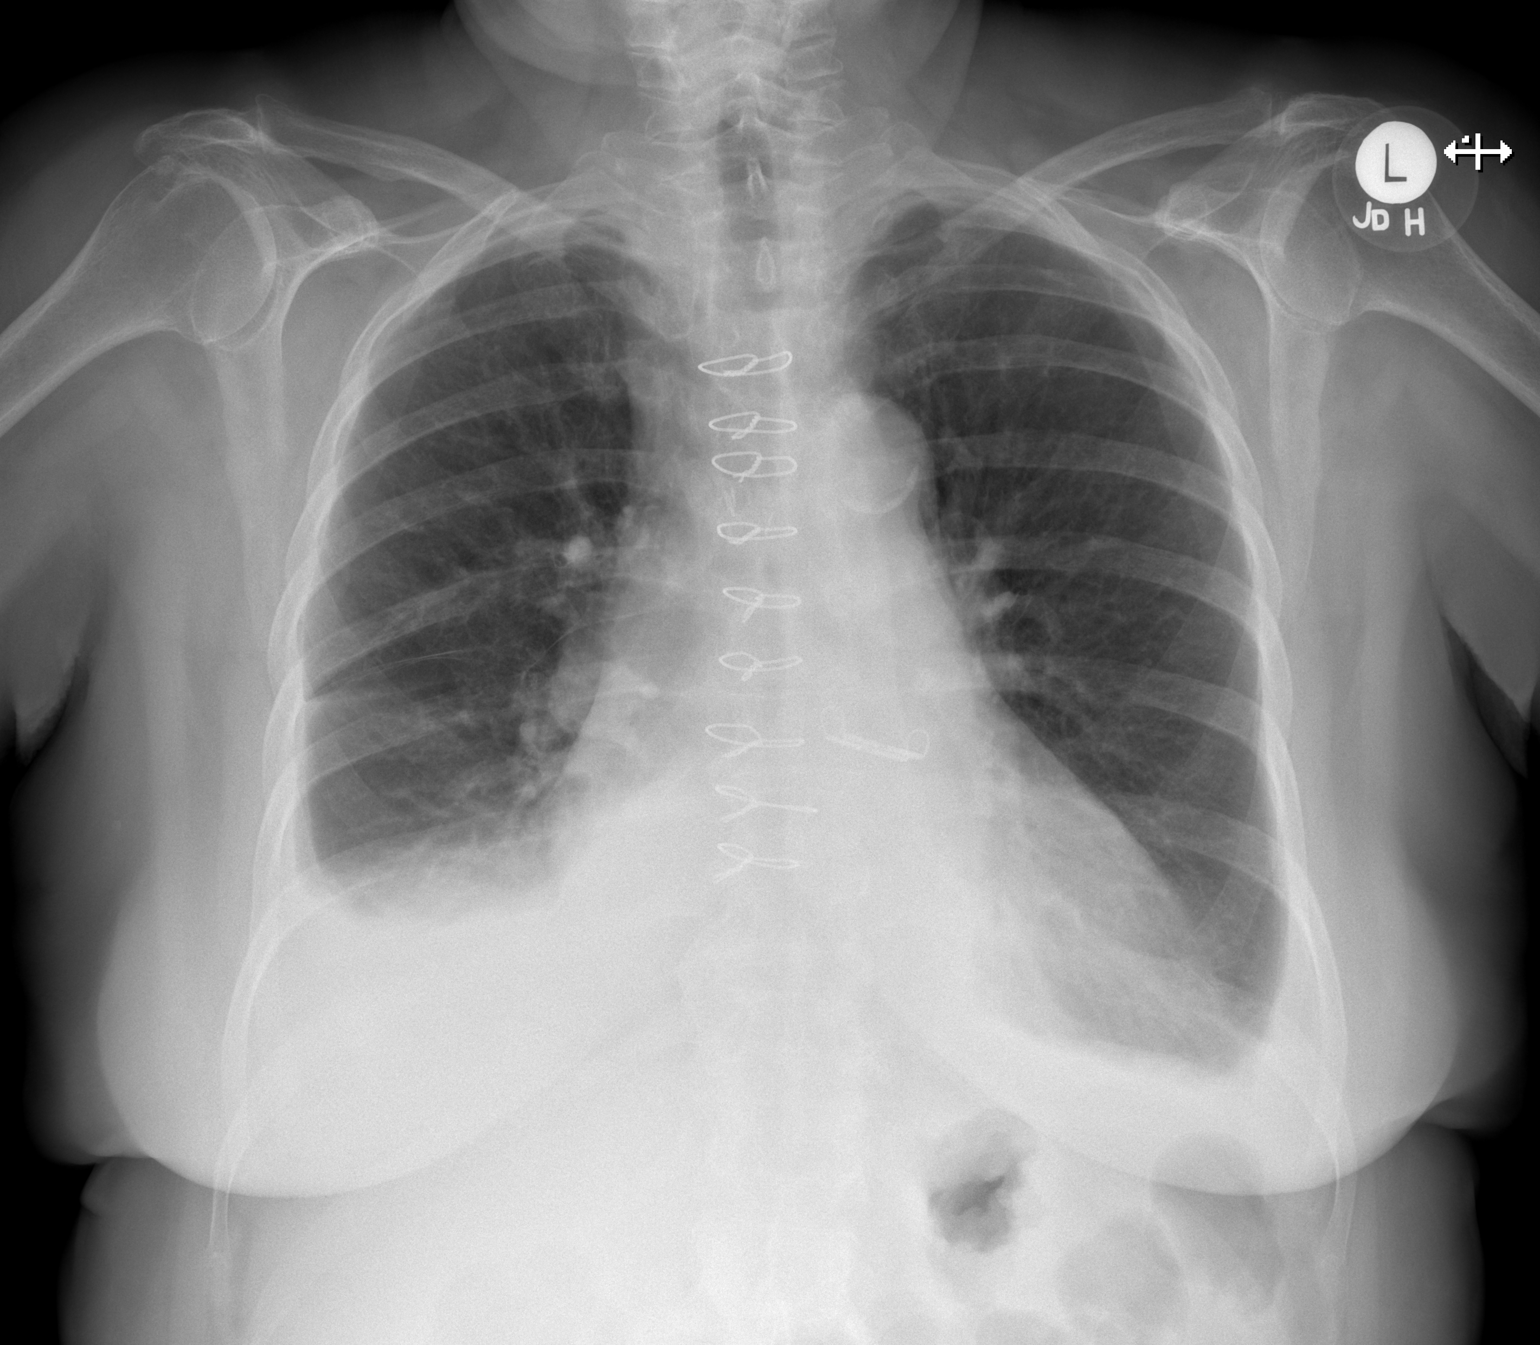

[w chest lat]
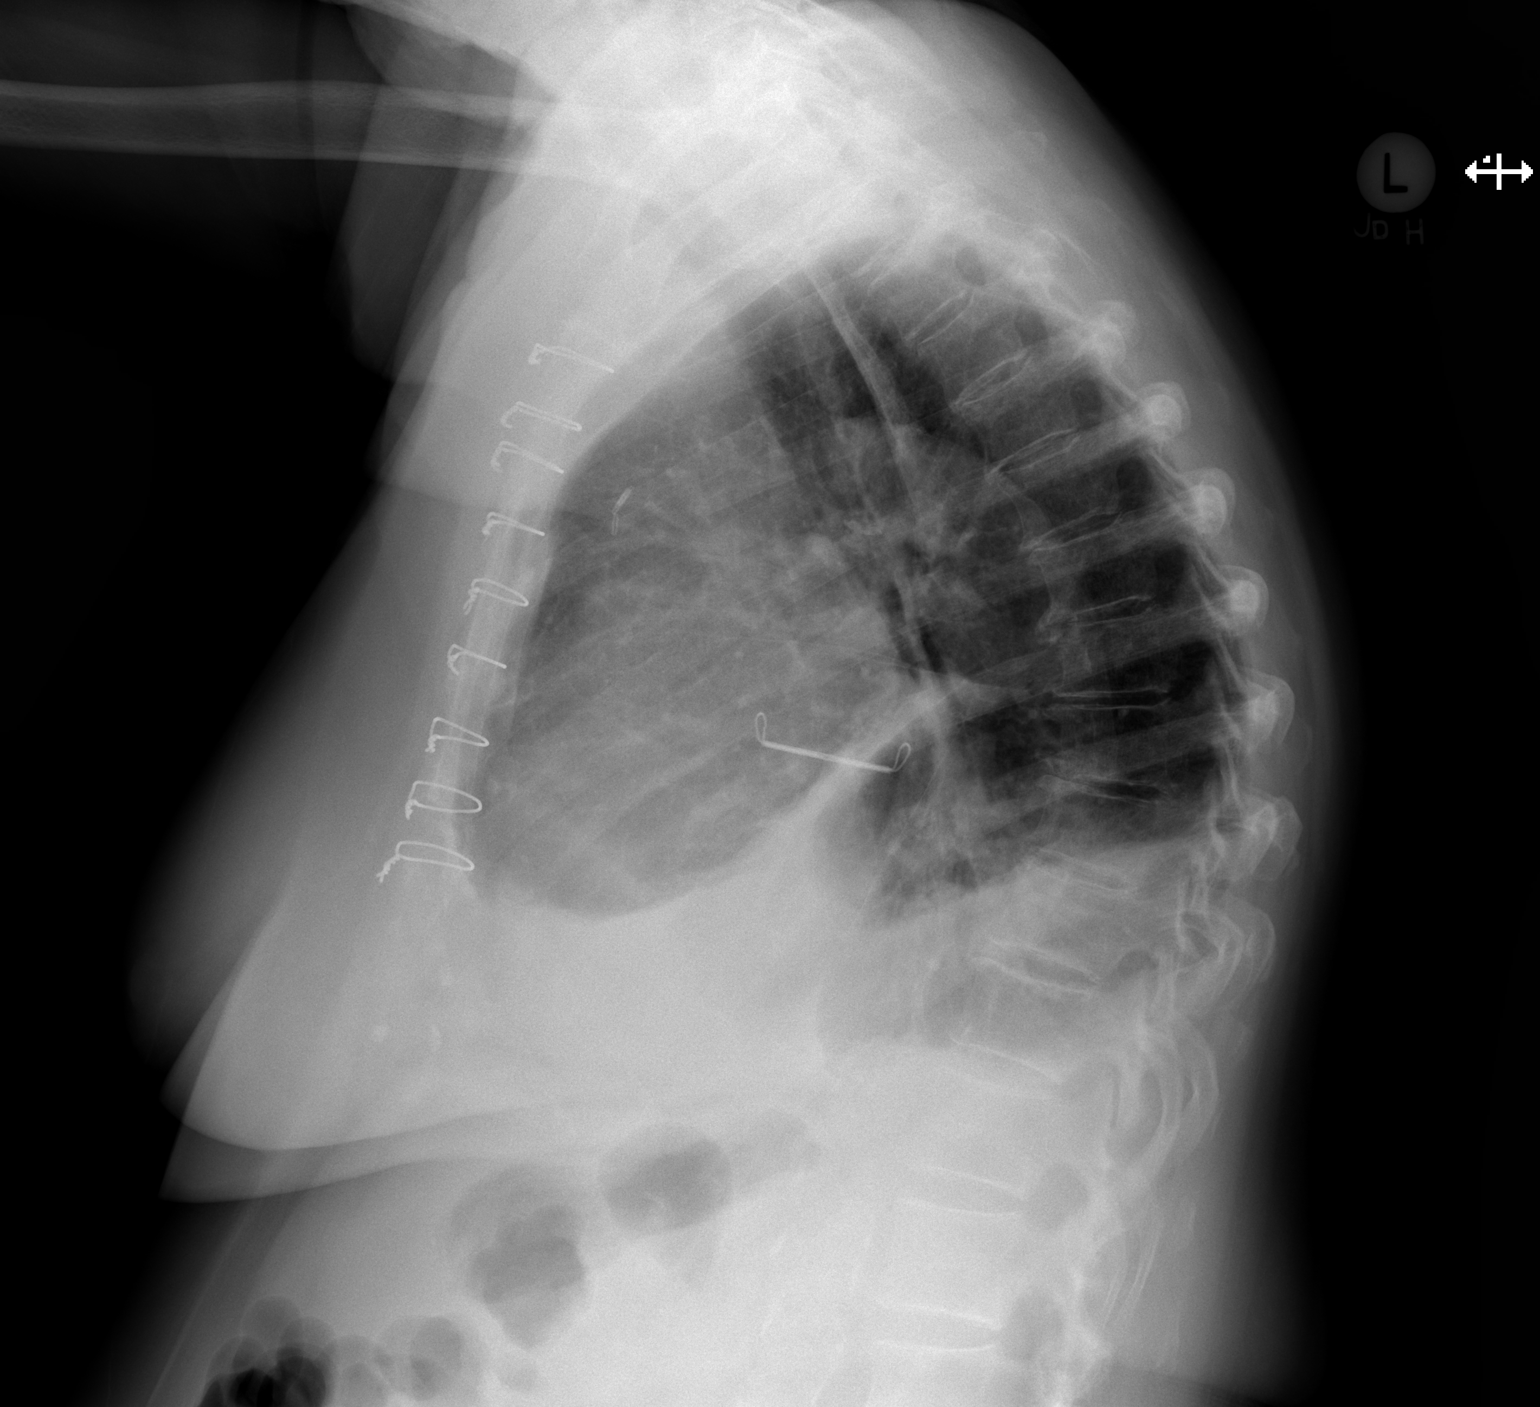

[2 of 2 positions shown; findings below may reference images not displayed]

FINDINGS: Stable cardiomegaly. Atherosclerosis of thoracic aorta is noted.
Sternotomy wires are noted. Mild bilateral pleural effusions are
noted, with right greater than left. Probable underlying atelectasis
in right lung base. Bony thorax is unremarkable.
IMPRESSION: Mild bilateral pleural effusions, with right greater than left, with
probable underlying atelectasis in right lung base. Aortic
atherosclerosis.

## 2018-01-07 ENCOUNTER — Other Ambulatory Visit: Payer: Self-pay | Admitting: Cardiology

## 2018-02-08 DIAGNOSIS — I4891 Unspecified atrial fibrillation: Secondary | ICD-10-CM | POA: Diagnosis not present

## 2018-02-08 DIAGNOSIS — I420 Dilated cardiomyopathy: Secondary | ICD-10-CM | POA: Diagnosis not present

## 2018-02-08 DIAGNOSIS — E7800 Pure hypercholesterolemia, unspecified: Secondary | ICD-10-CM | POA: Diagnosis not present

## 2018-02-08 DIAGNOSIS — I421 Obstructive hypertrophic cardiomyopathy: Secondary | ICD-10-CM | POA: Diagnosis not present

## 2018-03-25 DIAGNOSIS — H53432 Sector or arcuate defects, left eye: Secondary | ICD-10-CM | POA: Diagnosis not present

## 2018-03-25 DIAGNOSIS — H47013 Ischemic optic neuropathy, bilateral: Secondary | ICD-10-CM | POA: Diagnosis not present

## 2018-03-25 DIAGNOSIS — H35721 Serous detachment of retinal pigment epithelium, right eye: Secondary | ICD-10-CM | POA: Diagnosis not present

## 2018-03-25 DIAGNOSIS — H353122 Nonexudative age-related macular degeneration, left eye, intermediate dry stage: Secondary | ICD-10-CM | POA: Diagnosis not present

## 2018-03-25 DIAGNOSIS — H47293 Other optic atrophy, bilateral: Secondary | ICD-10-CM | POA: Diagnosis not present

## 2018-03-31 IMAGING — DX DG CHEST 2V
2 series · 2 of 2 positions shown · non-contrast
Comparison: 11/19/2015

CLINICAL DATA: Shortness of breath and cough

EXAM:
CHEST  2 VIEW

[chest pa]
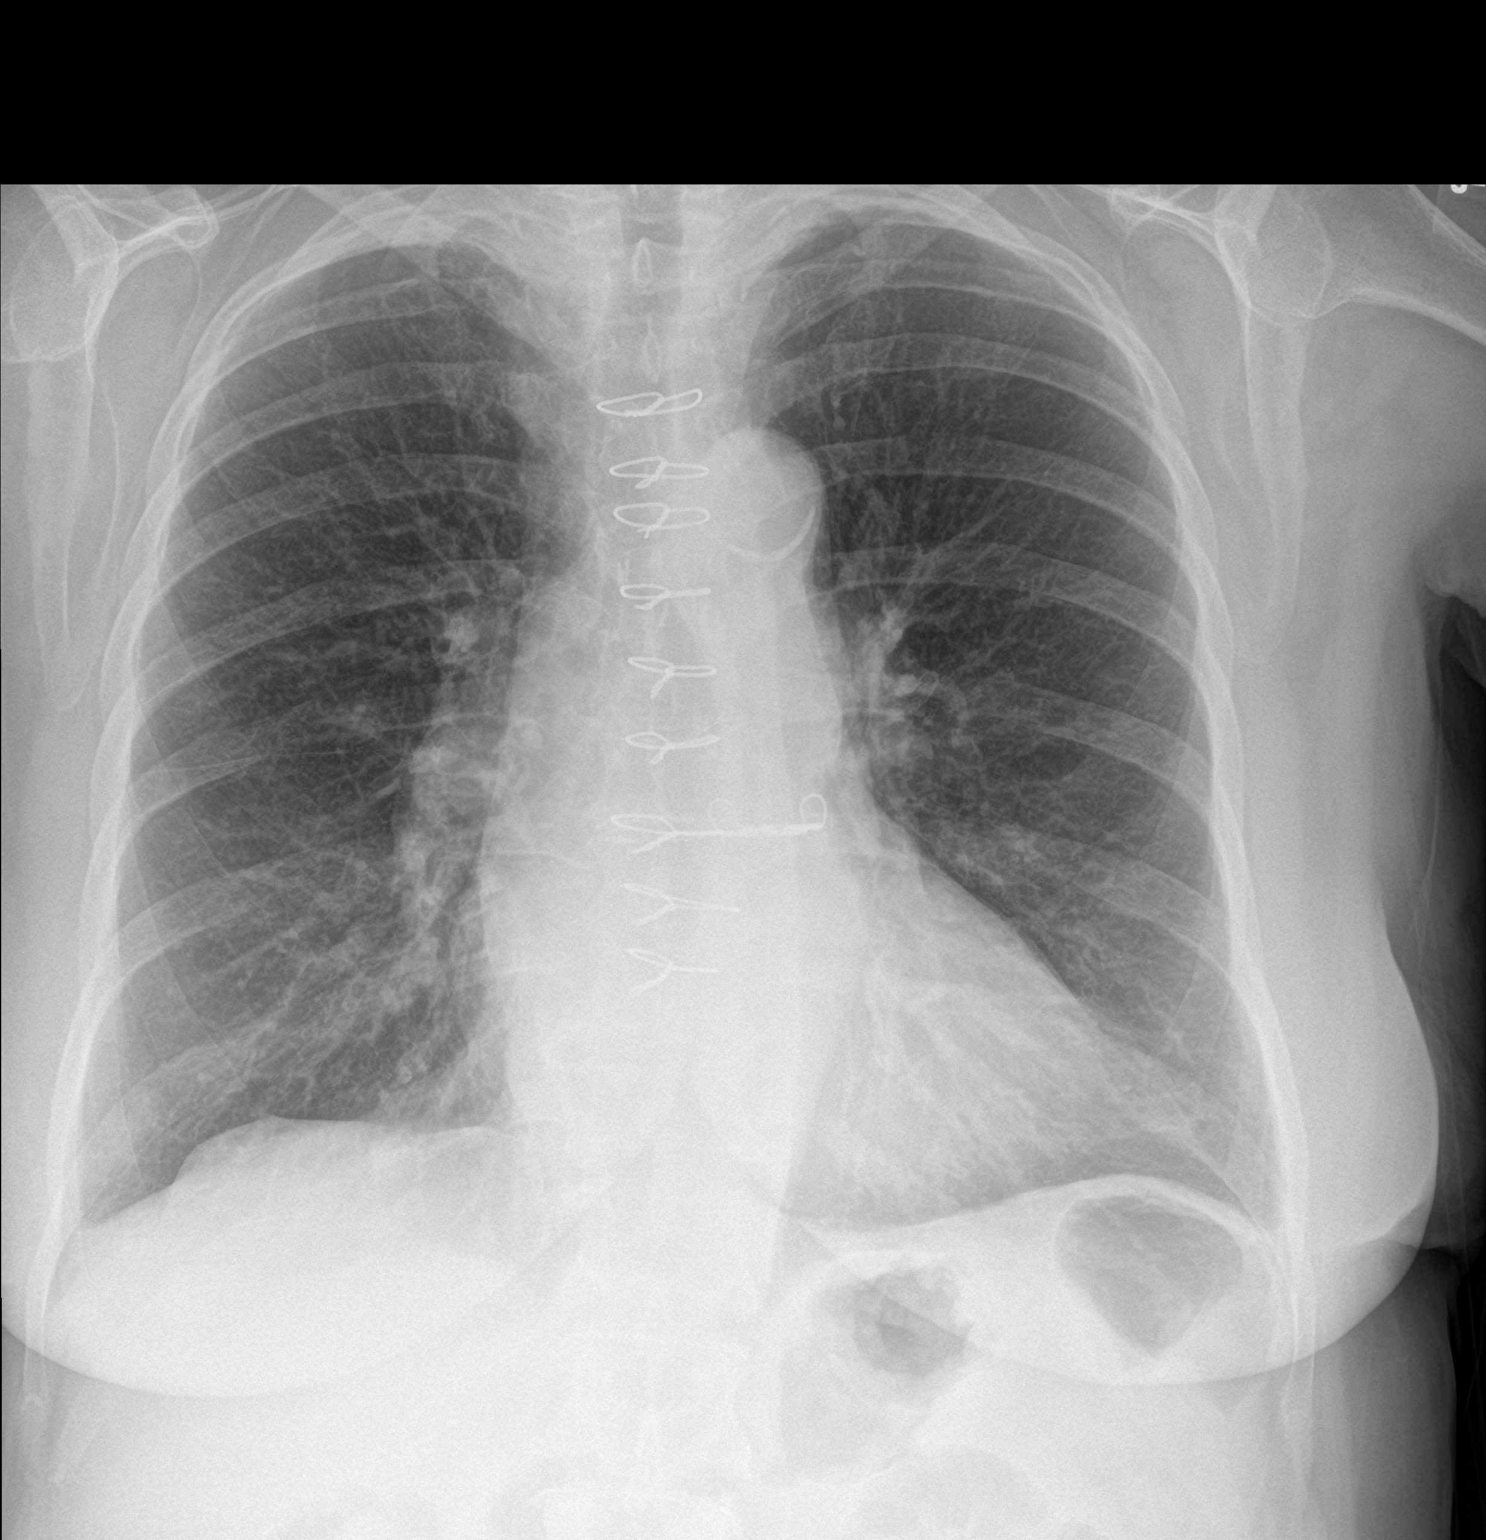

[chest lat]
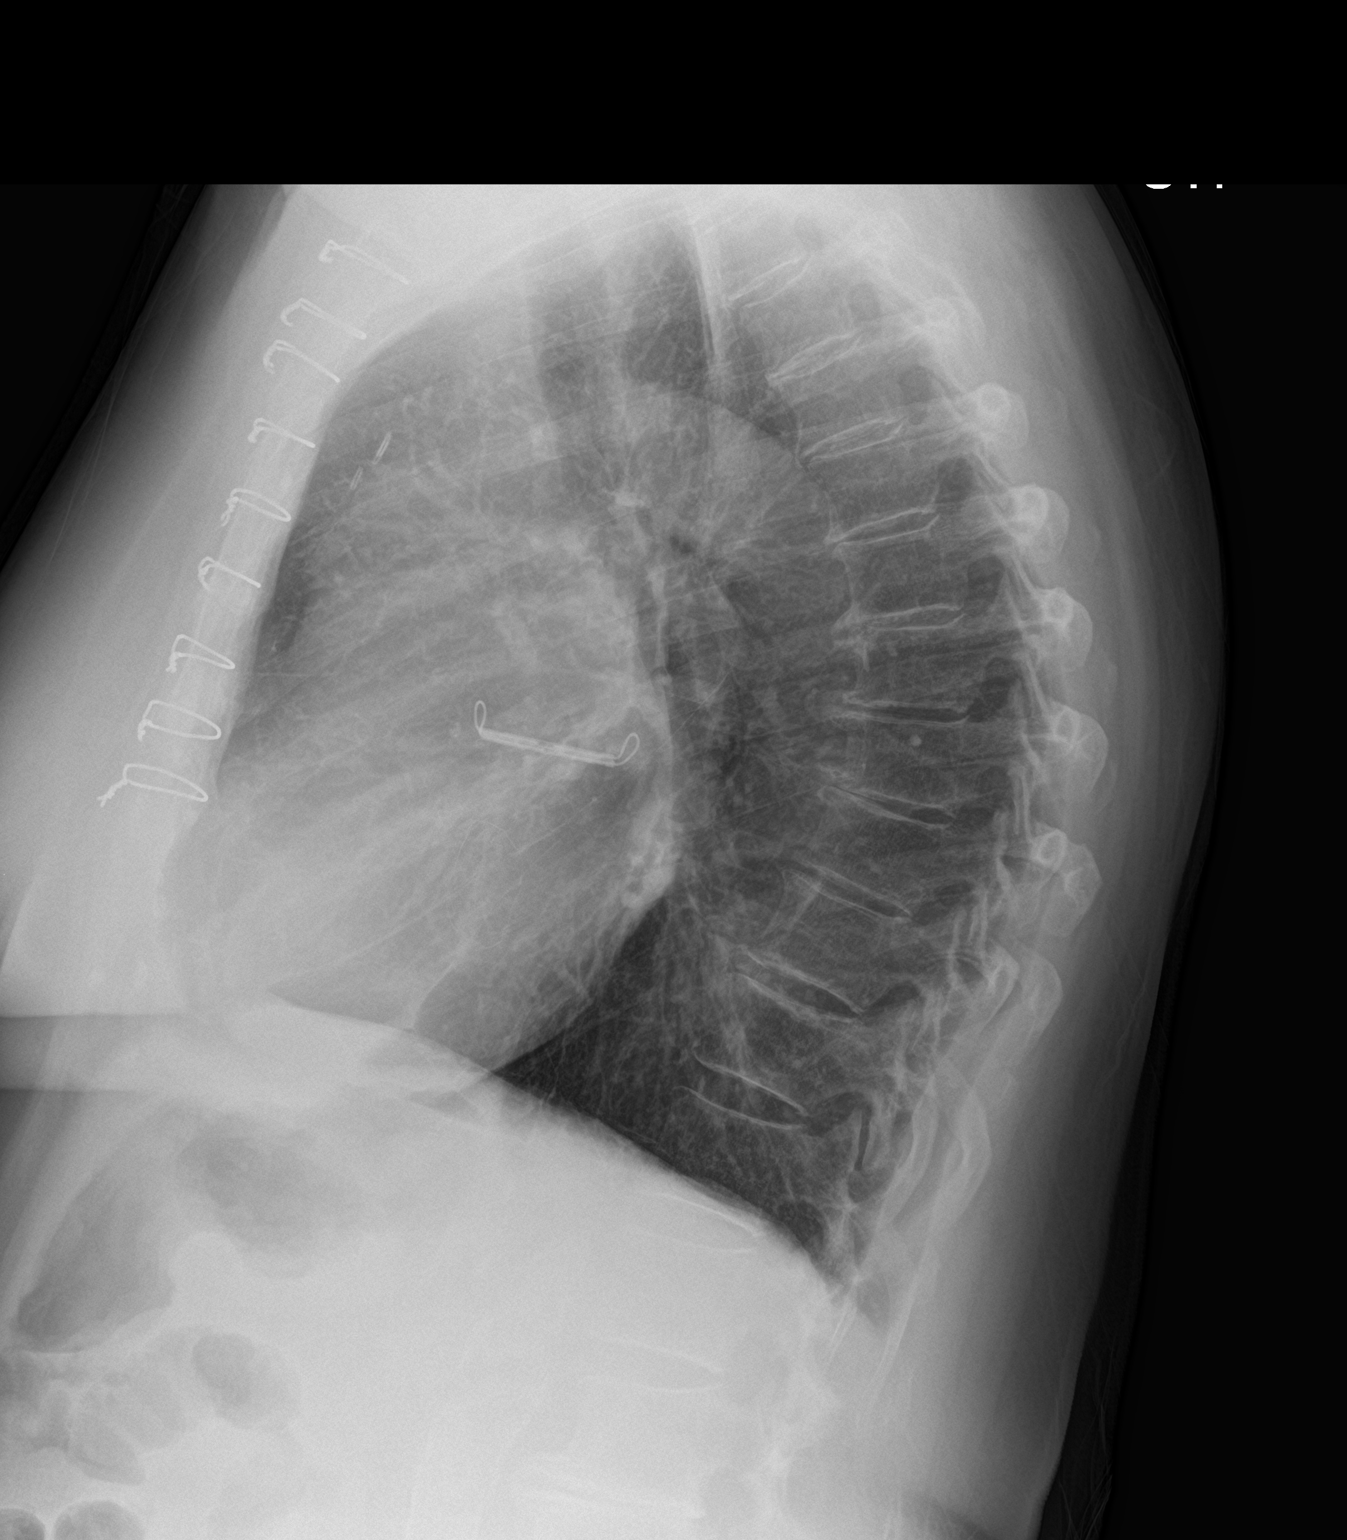

[2 of 2 positions shown; findings below may reference images not displayed]

FINDINGS: Cardiac shadow is stable. Postsurgical changes are again noted.
Previously seen effusions have resolved in the interval.
Hyperinflation consistent with COPD is noted. A few scattered
granulomas are again seen. No acute bony abnormality is noted.
IMPRESSION: COPD without acute abnormality.

## 2018-04-09 DIAGNOSIS — H2513 Age-related nuclear cataract, bilateral: Secondary | ICD-10-CM | POA: Diagnosis not present

## 2018-06-03 DIAGNOSIS — L570 Actinic keratosis: Secondary | ICD-10-CM | POA: Diagnosis not present

## 2018-06-03 DIAGNOSIS — D225 Melanocytic nevi of trunk: Secondary | ICD-10-CM | POA: Diagnosis not present

## 2018-06-03 DIAGNOSIS — L821 Other seborrheic keratosis: Secondary | ICD-10-CM | POA: Diagnosis not present

## 2018-06-03 DIAGNOSIS — Z85828 Personal history of other malignant neoplasm of skin: Secondary | ICD-10-CM | POA: Diagnosis not present

## 2018-06-04 ENCOUNTER — Telehealth: Payer: Self-pay | Admitting: Cardiology

## 2018-06-04 NOTE — Telephone Encounter (Signed)
Pt called after hours on call provider, me. She wanted to report to Dr. Martinique that she is having pain behind her left knee and down her leg. She was concerned about possible blood clot. She has no swelling of the lower leg, no redness and she is on Xarelto with no doses missed, so low likelihood of a clot. She reports good color and sensation of the lower leg. She called her orthopedist and is going to be seen tomorrow for evaluation. I feel this is appropriate. I mentioned a possible Baker's cyst and she says that rings a bell and that Dr. Martinique may have thought she had one in the past. I told her that if her symptoms become worse she can go to be assessed in the ED.  She feels comfortable with the current plan. She just wants for me to make Dr. Martinique aware as he has been her doctor for almost 20 years.   Daune Perch, AGNP-C [facility] 06/04/2018  6:58 PM

## 2018-06-05 DIAGNOSIS — M1712 Unilateral primary osteoarthritis, left knee: Secondary | ICD-10-CM | POA: Diagnosis not present

## 2018-06-05 DIAGNOSIS — M1990 Unspecified osteoarthritis, unspecified site: Secondary | ICD-10-CM | POA: Diagnosis not present

## 2018-06-26 DIAGNOSIS — M1712 Unilateral primary osteoarthritis, left knee: Secondary | ICD-10-CM | POA: Diagnosis not present

## 2018-06-26 DIAGNOSIS — M545 Low back pain: Secondary | ICD-10-CM | POA: Diagnosis not present

## 2018-06-26 DIAGNOSIS — M1990 Unspecified osteoarthritis, unspecified site: Secondary | ICD-10-CM | POA: Diagnosis not present

## 2018-08-01 ENCOUNTER — Other Ambulatory Visit: Payer: Self-pay | Admitting: Cardiology

## 2018-08-05 DIAGNOSIS — M1712 Unilateral primary osteoarthritis, left knee: Secondary | ICD-10-CM | POA: Diagnosis not present

## 2018-08-05 NOTE — Telephone Encounter (Signed)
Pt is an 84yof requesting xarelto 15mg , wt 83.5kg, scr 1.17 (08/07/17), ccr 49 ml/min. Will refill for one month but more labs will be needed for further refills lov w/ Martinique 04/27/17 and also needs cardiology appt

## 2018-08-06 ENCOUNTER — Other Ambulatory Visit: Payer: Self-pay | Admitting: Cardiology

## 2018-08-06 MED ORDER — RIVAROXABAN 15 MG PO TABS: 15.0000 mg | ORAL_TABLET | Freq: Every day | ORAL | 0 refills | Status: DC

## 2018-08-06 NOTE — Telephone Encounter (Signed)
Rx(s) sent to pharmacy electronically.  

## 2018-08-06 NOTE — Telephone Encounter (Signed)
°*  STAT* If patient is at the pharmacy, call can be transferred to refill team.   1. Which medications need to be refilled? (please list name of each medication and dose if known)  XARELTO 15 MG TABS tablet  2. Which pharmacy/location (including street and city if local pharmacy) is medication to be sent to?  [place], [place]  3. Do they need a 30 day or 90 day supply? [place] contacted the patient and said that Dr. Martinique needed to approve the refill request.  The patient is out of medication

## 2018-08-13 ENCOUNTER — Other Ambulatory Visit: Payer: Self-pay | Admitting: Cardiology

## 2018-08-14 DIAGNOSIS — M1712 Unilateral primary osteoarthritis, left knee: Secondary | ICD-10-CM | POA: Diagnosis not present

## 2018-08-21 DIAGNOSIS — M1712 Unilateral primary osteoarthritis, left knee: Secondary | ICD-10-CM | POA: Diagnosis not present

## 2018-09-03 ENCOUNTER — Telehealth: Payer: Self-pay | Admitting: Cardiology

## 2018-09-03 MED ORDER — RIVAROXABAN 15 MG PO TABS: 15.0000 mg | ORAL_TABLET | Freq: Every day | ORAL | 3 refills | Status: DC

## 2018-09-03 MED ORDER — DILTIAZEM HCL ER COATED BEADS 180 MG PO CP24: 180.0000 mg | ORAL_CAPSULE | Freq: Every day | ORAL | 3 refills | Status: DC

## 2018-09-03 NOTE — Telephone Encounter (Signed)
New Message    Pt would like for Melinda Gonzales to call her back  She has questions about an appt and her Medication    Please call

## 2018-09-03 NOTE — Telephone Encounter (Signed)
Spoke to patient she stated she needs 90 day refills on Xarelto and Diltiazem.Refills sent to pharmacy.

## 2018-09-16 ENCOUNTER — Encounter (INDEPENDENT_AMBULATORY_CARE_PROVIDER_SITE_OTHER): Payer: Medicare Other | Admitting: Ophthalmology

## 2018-09-16 ENCOUNTER — Other Ambulatory Visit: Payer: Self-pay

## 2018-09-16 DIAGNOSIS — H353132 Nonexudative age-related macular degeneration, bilateral, intermediate dry stage: Secondary | ICD-10-CM

## 2018-09-16 DIAGNOSIS — H2513 Age-related nuclear cataract, bilateral: Secondary | ICD-10-CM

## 2018-09-16 DIAGNOSIS — H43813 Vitreous degeneration, bilateral: Secondary | ICD-10-CM

## 2018-09-16 DIAGNOSIS — H47211 Primary optic atrophy, right eye: Secondary | ICD-10-CM | POA: Diagnosis not present

## 2018-09-18 DIAGNOSIS — M25562 Pain in left knee: Secondary | ICD-10-CM | POA: Diagnosis not present

## 2018-09-23 DIAGNOSIS — H25812 Combined forms of age-related cataract, left eye: Secondary | ICD-10-CM | POA: Diagnosis not present

## 2018-09-23 DIAGNOSIS — H35033 Hypertensive retinopathy, bilateral: Secondary | ICD-10-CM | POA: Diagnosis not present

## 2018-09-23 DIAGNOSIS — H353132 Nonexudative age-related macular degeneration, bilateral, intermediate dry stage: Secondary | ICD-10-CM | POA: Diagnosis not present

## 2018-09-23 DIAGNOSIS — H2511 Age-related nuclear cataract, right eye: Secondary | ICD-10-CM | POA: Diagnosis not present

## 2018-09-23 DIAGNOSIS — H4720 Unspecified optic atrophy: Secondary | ICD-10-CM | POA: Diagnosis not present

## 2018-09-30 ENCOUNTER — Telehealth: Payer: Self-pay | Admitting: Cardiology

## 2018-09-30 DIAGNOSIS — H2512 Age-related nuclear cataract, left eye: Secondary | ICD-10-CM | POA: Diagnosis not present

## 2018-09-30 DIAGNOSIS — H25812 Combined forms of age-related cataract, left eye: Secondary | ICD-10-CM | POA: Diagnosis not present

## 2018-09-30 NOTE — Telephone Encounter (Signed)
° ° ° °  Call from  Eye surgery center, stating patient was having some  SOB and increased HR after cataract  today but is doing better now. Would like sooner appointment with Dr Martinique. Scheduler spoke with spouse who declined APP appointment  Call husband Audelia Hives [phone]

## 2018-09-30 NOTE — Telephone Encounter (Signed)
Returned call to pt she states that she was in the opthmologist office and had her cataract surgery and MD told her to call and schedule a sooner appt to see Dr Martinique. there are no appointments available until November. She states that she will just keep scheduled appointment. She states that she thinks that she just had an anxiety attack "anyway" she declines APP appt. She will call back if she decides to make an appointment with APP.

## 2018-10-02 ENCOUNTER — Ambulatory Visit: Payer: Medicare Other | Admitting: Cardiology

## 2018-10-04 ENCOUNTER — Encounter: Payer: Self-pay | Admitting: Cardiology

## 2018-10-04 NOTE — Telephone Encounter (Signed)
error 

## 2018-10-05 ENCOUNTER — Telehealth: Payer: Self-pay | Admitting: Cardiology

## 2018-10-05 NOTE — Telephone Encounter (Signed)
Pt's husband called- his wife has been having exertional tachycardia.  No unusual chest pain, no resting tachycardia. I will call in metoprolol 12.5 mg BID and arrange for an office visit with an EKG next week.   Kerin Ransom PA-C 10/05/2018 12:13 PM

## 2018-10-08 DIAGNOSIS — R000 Tachycardia, unspecified: Secondary | ICD-10-CM

## 2018-10-08 NOTE — Progress Notes (Signed)
Cardiology Office Note   Date:  10/09/2018   ID:  [name], Hanifan 03-06-1934, MRN [id number]  PCP:  Kelton Pillar, MD  Cardiologist:  Peter Martinique, MD EP: None  Chief Complaint  Patient presents with  . Tachycardia  . Shortness of Breath      History of Present Illness: Melinda Gonzales is a 84 y.o. female with PMH of HOCM s/p septal myectomy with MVR for management of severe MR and MAZE in 2017, paroxysmal atrial fibrillation on xarelto, HLD, and CKD stage 2-3, who presents for the evaluation of exertional tachycardia.  She was last evaluated by cardiology at an outpatient visit with Dr. Martinique 03/2017, at which time she was doing well from a cardiac standpoint, though quite limited in activity. No medication changes occurred and she was recommended to follow-up in 6 months, though was lost to follow-up. Her last echocardiogram 03/2017 showed stable findings with good MV prosthesis function, EF 55-60%, G3DD, and mild LVH. Her last ischemic evaluation was a R/LHC in 2017 prior to her open heart surgery which showed non-obstructive CAD with 25%pLAD stenosis, 40% 1st diagonal stenosis, and 20% pRCA stenosis.   [name] called our office 10/05/2018 with complaints of exertional tachycardia without complaints of chest pain. She was started on metoprolol 12.5mg  BID and scheduled for this in office visit to follow-up on her symptoms. She returns today with her husband for follow-up of her tachycardia. She reported taking metoprolol for a couple days but stopped due to some nausea. She reported taking metoprolol on an empty stomach. She has also been experiencing DOE for the past couple weeks which has progressively worsening. No complaints of orthopnea or PND, but has noticed some swelling in her ankles today and rings were tighter on her hand this morning. She does not weigh herself regularly. No complaints of chest pain, dizziness, lightheadedness, or syncope.     Past Medical  History:  Diagnosis Date  . Chronic diastolic CHF (congestive heart failure) ([place]) 07/06/2015  . Dyspnea   . Heart murmur   . Hypercholesterolemia   . Hypertrophic obstructive cardiomyopathy (HOCM) ([place])   . LVH (left ventricular hypertrophy)    WITH NORMAL SYSTOLIC FUNCTION  . SOB (shortness of breath)     Past Surgical History:  Procedure Laterality Date  . CARDIAC CATHETERIZATION N/A 07/06/2015   Procedure: Right/Left Heart Cath and Coronary Angiography;  Surgeon: Peter M Martinique, MD;  Location: [facility];  Service: Cardiovascular;  Laterality: N/A;  . MITRAL VALVE REPLACEMENT N/A 11/15/2015   ligation of left atrial appendage, maze procedure at [facility]     Current Outpatient Medications  Medication Sig Dispense Refill  . diltiazem (CARDIZEM CD) 180 MG 24 hr capsule Take 1 capsule (180 mg total) by mouth daily. 90 capsule 3  . Multiple Vitamins-Minerals (PRESERVISION AREDS 2 PO) Take 1 capsule by mouth 2 (two) times daily.    . Rivaroxaban (XARELTO) 15 MG TABS tablet Take 1 tablet (15 mg total) by mouth daily with supper. 90 tablet 3  . rosuvastatin (CRESTOR) 5 MG tablet Take 1 tablet (5 mg total) daily by mouth. 90 tablet 3   No current facility-administered medications for this visit.     Allergies:   Oxycodone    Social History:  The patient  reports that she quit smoking about 53 years ago. She has never used smokeless tobacco. She reports that she does not drink alcohol or use drugs.   Family History:  The  patient's family history includes Heart attack (age of onset: 8) in her father; Heart failure (age of onset: 16) in her mother.    ROS:  Please see the history of present illness.   Otherwise, review of systems are positive for none.   All other systems are reviewed and negative.    PHYSICAL EXAM: VS:  BP 122/80   Pulse (!) 124   Ht 5' 1.5" (1.562 m)   Wt 180 lb 12.8 oz (82 kg)   SpO2 95%   BMI 33.61 kg/m  , BMI Body mass index is 33.61 kg/m.  GEN: Well nourished, well developed, in mild respiratory distress HEENT: sclera anicteric Neck: no JVD, carotid bruits, or masses Cardiac: tachycardic, regular rhythm; no murmurs, rubs, or gallops, 1+ LE edema  Respiratory: visibly SOB, speaking in short sentences, clear to auscultation bilaterally, no wheezes, rales, rhonchi GI: soft, nontender, nondistended, + BS MS: no deformity or atrophy Skin: warm and dry, no rash Neuro:  Strength and sensation are intact Psych: euthymic mood, full affect   EKG:  EKG is ordered today. The ekg ordered today demonstrates sinus tachycardia with rate 125 bpm and chronic LBBB.    Recent Labs: No results found for requested labs within last 8760 hours.    Lipid Panel    Component Value Date/Time   CHOL 196 02/24/2016 0839   TRIG 115 02/24/2016 0839   HDL 58 02/24/2016 0839   CHOLHDL 3.4 02/24/2016 0839   CHOLHDL 4.2 05/19/2015 0754   VLDL 30 05/19/2015 0754   LDLCALC 115 (H) 02/24/2016 0839      Wt Readings from Last 3 Encounters:  10/09/18 180 lb 12.8 oz (82 kg)  04/27/17 184 lb (83.5 kg)  10/26/16 181 lb (82.1 kg)      Other studies Reviewed: Additional studies/ records that were reviewed today include:   Echocardiogram 03/2017: Study Conclusions  - Valve surgery. Mitral valve replacement with a [employer]   porcine valve. 6mm Biocor bioprosthetic valve. - Left ventricle: The cavity size was normal. Wall thickness was   increased in a pattern of mild LVH. Systolic function was normal.   The estimated ejection fraction was in the range of 55% to 60%.   Wall motion was normal; there were no regional wall motion   abnormalities. Doppler parameters are consistent with a   reversible restrictive pattern, indicative of decreased left   ventricular diastolic compliance and/or increased left atrial   pressure (grade 3 diastolic dysfunction). - Aortic valve: There was trivial regurgitation. Mean gradient (S):   11 mm Hg. Peak  gradient (S): 19 mm Hg. - Mitral valve: A bioprosthesis was present. The prosthesis had a   normal range of motion. Mean gradient (D): 5 mm Hg. - Left atrium: The atrium was mildly dilated. - Pulmonary arteries: Systolic pressure was moderately increased.   PA peak pressure: 56 mm Hg (S).  Right/Left heart catheterization 07/2015:  Ost LAD to Prox LAD lesion, 25% stenosed.  Ost 1st Diag to 1st Diag lesion, 40% stenosed.  Prox RCA to Mid RCA lesion, 20% stenosed.   1. Nonobstructive CAD 2. Moderate to severe pulmonary HTN with elevated LV filling pressures. 3. Good cardiac output.  4. Mild LV gradient by pullback. Significant post PVC increase in gradient consistent with dynamic outflow obstruction.   Plan: consider surgical septal myectomy    ASSESSMENT AND PLAN:  1. Tachycardia: EKG today with sinus tachycardia with rate 125 bpm. She has not been taking metoprolol for the  past week due to some nausea when first starting the medication.  - Will plan for a 3 day zio patch monitor to evaluate for arrhythmias - Restart metoprolol 12.5mg  BID - encouraged her to take with food to minimize GI upset  2. Acute on chronic diastolic CHF: She reports DOE for the past couple weeks as well as some swelling in her legs/hands. She is visibly SOB today, speaking in short sentences. Lungs are clear on exam but she does have 1+ edema in her LE.  - Will check a BMET and BNP today.  - Will start lasix 40mg  daily x3 days, then 20mg  daily until her follow-up visit.  - Will repeat an echocardiogram to evaluate LV function and valve.   3. HOCM: s/p septal myectomy in 2017. Stable on echo 03/2017.She is now experiencing DOE which she states is reminiscent to before her open heart surgery.   - Will repeat an echocardiogram - Diuresis as above  4. Paroxysmal atrial fibrillation: episode occurred post-op after septal myectomy with MVR and MAZE. EKG today with sinus rhythm. No complaints of bleeding on  xarelto and aspirin 162mg  daily.  - Will check a CBC to r/o anemia as possible contributor to DOE.  - Will check a 3 day zio patch to evaluate for recurrent arrhythmia - Will restart metoprolol as above - Continue dilitazem for rate control  - Continue xarelto for stroke ppx  5. Non-obstructive CAD: noted to have mild disease on [facility] in 2017. She has been taking aspirin 162mg  daily.  - Unlikely DOE represents an anginal equivalent, though could consider an ischemic evaluation if no improvement with diuresis.  - Given need for anticoagulation, recommended stopping aspirin to minimize bleeding risk.  - Continue crestor  6. Severe MR s/p MVR in 2017: valve appeared stable on last echo 03/2017.  - Will repeat echo for monitoring of valve replacement - Continue SBE ppx for dental cleanings  7. HLD: LDL 70 on labs 07/2017 - Continue crestor  8. CKD stage 3: Cr 1.26 on labs 01/2018 - Will check a BMET today    Current medicines are reviewed at length with the patient today.  The patient does not have concerns regarding medicines.  The following changes have been made:  Restart metoprolol 12.5mg  BID. Start lasix 40mg  daily x3 days, then 20mg  daily until her follow-up visit  Labs/ tests ordered today include:   Orders Placed This Encounter  Procedures  . CBC  . Basic metabolic panel  . Brain natriuretic peptide  . LONG TERM MONITOR-LIVE TELEMETRY (3-14 DAYS)  . ECHOCARDIOGRAM COMPLETE     Disposition:   FU with me in 1 week  Signed, Abigail Butts, PA-C  10/09/2018 9:46 AM

## 2018-10-09 ENCOUNTER — Ambulatory Visit (INDEPENDENT_AMBULATORY_CARE_PROVIDER_SITE_OTHER): Payer: Medicare Other | Admitting: Medical

## 2018-10-09 ENCOUNTER — Other Ambulatory Visit: Payer: Self-pay

## 2018-10-09 ENCOUNTER — Telehealth: Payer: Self-pay | Admitting: *Deleted

## 2018-10-09 ENCOUNTER — Encounter: Payer: Self-pay | Admitting: Medical

## 2018-10-09 VITALS — BP 122/80 | HR 124 | Ht 61.5 in | Wt 180.8 lb

## 2018-10-09 DIAGNOSIS — Z79899 Other long term (current) drug therapy: Secondary | ICD-10-CM | POA: Diagnosis not present

## 2018-10-09 DIAGNOSIS — R0600 Dyspnea, unspecified: Secondary | ICD-10-CM | POA: Diagnosis not present

## 2018-10-09 DIAGNOSIS — Z952 Presence of prosthetic heart valve: Secondary | ICD-10-CM

## 2018-10-09 DIAGNOSIS — I5033 Acute on chronic diastolic (congestive) heart failure: Secondary | ICD-10-CM

## 2018-10-09 DIAGNOSIS — R0609 Other forms of dyspnea: Secondary | ICD-10-CM | POA: Diagnosis not present

## 2018-10-09 DIAGNOSIS — N183 Chronic kidney disease, stage 3 unspecified: Secondary | ICD-10-CM

## 2018-10-09 DIAGNOSIS — I2510 Atherosclerotic heart disease of native coronary artery without angina pectoris: Secondary | ICD-10-CM

## 2018-10-09 DIAGNOSIS — I421 Obstructive hypertrophic cardiomyopathy: Secondary | ICD-10-CM | POA: Diagnosis not present

## 2018-10-09 DIAGNOSIS — R000 Tachycardia, unspecified: Secondary | ICD-10-CM | POA: Diagnosis not present

## 2018-10-09 DIAGNOSIS — I480 Paroxysmal atrial fibrillation: Secondary | ICD-10-CM

## 2018-10-09 DIAGNOSIS — E7800 Pure hypercholesterolemia, unspecified: Secondary | ICD-10-CM

## 2018-10-09 DIAGNOSIS — R0602 Shortness of breath: Secondary | ICD-10-CM | POA: Diagnosis not present

## 2018-10-09 MED ORDER — FUROSEMIDE 20 MG PO TABS: 20.0000 mg | ORAL_TABLET | Freq: Every day | ORAL | 3 refills | Status: DC

## 2018-10-09 NOTE — Telephone Encounter (Signed)
3 day ZIO XT long term holter monitor mailed to patients home.  Instructions reviewed briefly as they are included in the monitor kit.

## 2018-10-09 NOTE — Patient Instructions (Addendum)
Medication Instructions:  STOP ASPIRIN TAKE METOPROLOL 12.5MG  TWICE DAILY TAKE LASIX 40MG  DAILY FOR 3 DAYS THEN BACK TO 20MG  DAILY If you need a refill on your cardiac medications before your next appointment, please call your pharmacy.  Labwork: CBC,BMP,BNP TODAY HERE IN OUR OFFICE AT [facility]   Take the provided lab slips with you to the lab for your blood draw.   When you have your labs (blood work) drawn today and your tests are completely normal, you will receive your results only by MyChart Message (if you have MyChart) -OR-  A paper copy in the mail.  If you have any lab test that is abnormal or we need to change your treatment, we will call you to review these results.  Testing/Procedures: Echocardiogram BEFORE FOLLOW UP ON 10-17-2018 - Your physician has requested that you have an echocardiogram. Echocardiography is a painless test that uses sound waves to create images of your heart. It provides your doctor with information about the size and shape of your heart and how well your heart's chambers and valves are working. This procedure takes approximately one hour. There are no restrictions for this procedure. This will be performed at our [facility] location - [address].  Your physician has recommended that you wear a 3 DAY DAY ZIO-PATCH monitor. The Zio patch cardiac monitor continuously records heart rhythm data for up to 14 days, this is for patients being evaluated for multiple types heart rhythms. For the first 24 hours post application, please avoid getting the Zio monitor wet in the shower or by excessive sweating during exercise. After that, feel free to carry on with regular activities. Keep soaps and lotions away from the ZIO XT Patch.  This will be placed at our [facility] location - [address], [address].      Follow-Up: You will need a follow up appointment on 10-17-2018 @ 115PM WITH Vianey Caniglia, PA-C.  You may see Peter Martinique, MD Roby Lofts, PA-C  or one of the following Advanced Practice Providers on your designated Care Team: [place], [place] . Fabian Sharp, PA-C     At [facility], you and your health needs are our priority.  As part of our continuing mission to provide you with exceptional heart care, we have created designated Provider Care Teams.  These Care Teams include your primary Cardiologist (physician) and Advanced Practice Providers (APPs -  Physician Assistants and Nurse Practitioners) who all work together to provide you with the care you need, when you need it.  Thank you for choosing [facility] at [facility]!!      Heart Failure Education: 1. Weigh yourself EVERY morning after you go to the bathroom but before you eat or drink anything. Write this number down in a weight log/diary. If you gain 3 pounds overnight or 5 pounds in a week, call the office. 2. Take your medicines as prescribed. If you have concerns about your medications, please call us before you stop taking them.  3. Eat low salt foods-Limit salt (sodium) to 2000 mg per day. This will help prevent your body from holding onto fluid. Read food labels as many processed foods have a lot of sodium, especially canned goods and prepackaged meats. If you would like some assistance choosing low sodium foods, we would be happy to set you up with a nutritionist. 4. Stay as active as you can everyday. Staying active will give you more energy and make your muscles stronger.  Start with 5 minutes at a time and work your way up to 30 minutes a day. Break up your activities--do some in the morning and some in the afternoon. Start with 3 days per week and work your way up to 5 days as you can.  If you have chest pain, feel short of breath, dizzy, or lightheaded, STOP. If you don't feel better after a short rest, call 911. If you do feel better, call the office to let us know you have symptoms with exercise. 5. Limit all fluids for the day to less than 2 liters. Fluid includes  all drinks, coffee, juice, ice chips, soup, jello, and all other liquids.

## 2018-10-09 NOTE — Addendum Note (Signed)
Addended by: Waylan Rocher on: 10/09/2018 11:02 AM   Modules accepted: Orders

## 2018-10-11 ENCOUNTER — Other Ambulatory Visit (INDEPENDENT_AMBULATORY_CARE_PROVIDER_SITE_OTHER): Payer: Medicare Other

## 2018-10-11 DIAGNOSIS — R000 Tachycardia, unspecified: Secondary | ICD-10-CM

## 2018-10-11 LAB — CBC
Hematocrit: 40.1 % (ref 34.0–46.6)
Hemoglobin: 12.8 g/dL (ref 11.1–15.9)
MCH: 30.4 pg (ref 26.6–33.0)
MCHC: 31.9 g/dL (ref 31.5–35.7)
MCV: 95 fL (ref 79–97)
Platelets: 241 10*3/uL (ref 150–450)
RBC: 4.21 x10E6/uL (ref 3.77–5.28)
RDW: 13.4 % (ref 11.7–15.4)
WBC: 8.8 10*3/uL (ref 3.4–10.8)

## 2018-10-11 LAB — BRAIN NATRIURETIC PEPTIDE: BNP: 467.5 pg/mL — ABNORMAL HIGH (ref 0.0–100.0)

## 2018-10-14 NOTE — Progress Notes (Signed)
The patient has been notified of the result and verbalized understanding.  All questions (if any) were answered. Jacqulynn Cadet, [place] 10/14/2018 5:14 PM

## 2018-10-15 ENCOUNTER — Ambulatory Visit (HOSPITAL_COMMUNITY): Payer: Medicare Other | Attending: Cardiology

## 2018-10-15 ENCOUNTER — Other Ambulatory Visit: Payer: Self-pay

## 2018-10-15 DIAGNOSIS — R0609 Other forms of dyspnea: Secondary | ICD-10-CM | POA: Diagnosis not present

## 2018-10-16 ENCOUNTER — Other Ambulatory Visit (INDEPENDENT_AMBULATORY_CARE_PROVIDER_SITE_OTHER): Payer: Medicare Other

## 2018-10-16 DIAGNOSIS — R000 Tachycardia, unspecified: Secondary | ICD-10-CM | POA: Diagnosis not present

## 2018-10-17 ENCOUNTER — Encounter: Payer: Self-pay | Admitting: Medical

## 2018-10-17 ENCOUNTER — Ambulatory Visit (INDEPENDENT_AMBULATORY_CARE_PROVIDER_SITE_OTHER): Payer: Medicare Other | Admitting: Medical

## 2018-10-17 ENCOUNTER — Other Ambulatory Visit: Payer: Self-pay

## 2018-10-17 VITALS — BP 122/76 | HR 119 | Ht 61.5 in | Wt 179.0 lb

## 2018-10-17 DIAGNOSIS — N183 Chronic kidney disease, stage 3 unspecified: Secondary | ICD-10-CM

## 2018-10-17 DIAGNOSIS — I2510 Atherosclerotic heart disease of native coronary artery without angina pectoris: Secondary | ICD-10-CM

## 2018-10-17 DIAGNOSIS — E7800 Pure hypercholesterolemia, unspecified: Secondary | ICD-10-CM | POA: Diagnosis not present

## 2018-10-17 DIAGNOSIS — I472 Ventricular tachycardia: Secondary | ICD-10-CM | POA: Diagnosis not present

## 2018-10-17 DIAGNOSIS — I421 Obstructive hypertrophic cardiomyopathy: Secondary | ICD-10-CM

## 2018-10-17 DIAGNOSIS — I480 Paroxysmal atrial fibrillation: Secondary | ICD-10-CM | POA: Diagnosis not present

## 2018-10-17 DIAGNOSIS — R000 Tachycardia, unspecified: Secondary | ICD-10-CM

## 2018-10-17 DIAGNOSIS — I5041 Acute combined systolic (congestive) and diastolic (congestive) heart failure: Secondary | ICD-10-CM

## 2018-10-17 DIAGNOSIS — Z952 Presence of prosthetic heart valve: Secondary | ICD-10-CM | POA: Diagnosis not present

## 2018-10-17 DIAGNOSIS — R0602 Shortness of breath: Secondary | ICD-10-CM | POA: Diagnosis not present

## 2018-10-17 DIAGNOSIS — I5032 Chronic diastolic (congestive) heart failure: Secondary | ICD-10-CM | POA: Diagnosis not present

## 2018-10-17 MED ORDER — FUROSEMIDE 40 MG PO TABS: ORAL_TABLET | ORAL | 0 refills | Status: DC

## 2018-10-17 MED ORDER — CARVEDILOL 6.25 MG PO TABS: 6.2500 mg | ORAL_TABLET | Freq: Two times a day (BID) | ORAL | 3 refills | Status: DC

## 2018-10-17 MED ORDER — POTASSIUM CHLORIDE CRYS ER 20 MEQ PO TBCR: 20.0000 meq | EXTENDED_RELEASE_TABLET | Freq: Every day | ORAL | 3 refills | Status: DC

## 2018-10-17 NOTE — Patient Instructions (Addendum)
Medication Instructions:  STOP Diltiazem  STOP Metoprolol  START Carvedilol 6.25 mg twice daily.  Increase Lasix to 40 mg twice daily for 3 days; then take 40 mg once daily.  If you need a refill on your cardiac medications before your next appointment, please call your pharmacy.   Lab work: Your physician recommends that you return for lab work today: BMET, TSH  If you have labs (blood work) drawn today and your tests are completely normal, you will receive your results only by: Marland Kitchen MyChart Message (if you have MyChart) OR . A paper copy in the mail If you have any lab test that is abnormal or we need to change your treatment, we will call you to review the results.   Follow-Up: At [facility], you and your health needs are our priority.  As part of our continuing mission to provide you with exceptional heart care, we have created designated Provider Care Teams.  These Care Teams include your primary Cardiologist (physician) and Advanced Practice Providers (APPs -  Physician Assistants and Nurse Practitioners) who all work together to provide you with the care you need, when you need it. . You have been scheduled for a follow-up appointment to see Dr. Martinique on Monday, 10/28/18 at 9:20 AM in the office.   Heart Failure Education: 1. Weigh yourself EVERY morning after you go to the bathroom but before you eat or drink anything. Write this number down in a weight log/diary. If you gain 3 pounds overnight or 5 pounds in a week, call the office. 2. Take your medicines as prescribed. If you have concerns about your medications, please call us before you stop taking them.  3. Eat low salt foods-Limit salt (sodium) to 2000 mg per day. This will help prevent your body from holding onto fluid. Read food labels as many processed foods have a lot of sodium, especially canned goods and prepackaged meats. If you would like some assistance choosing low sodium foods, we would be happy to set you up  with a nutritionist. 4. Stay as active as you can everyday. Staying active will give you more energy and make your muscles stronger. Start with 5 minutes at a time and work your way up to 30 minutes a day. Break up your activities--do some in the morning and some in the afternoon. Start with 3 days per week and work your way up to 5 days as you can.  If you have chest pain, feel short of breath, dizzy, or lightheaded, STOP. If you don't feel better after a short rest, call 911. If you do feel better, call the office to let us know you have symptoms with exercise. 5. Limit all fluids for the day to less than 2 liters. Fluid includes all drinks, coffee, juice, ice chips, soup, jello, and all other liquids.

## 2018-10-17 NOTE — Progress Notes (Addendum)
Cardiology Office Note   Date:  10/17/2018   ID:  Leone, King 1934/03/09, MRN [id number]  PCP:  Kelton Pillar, MD  Cardiologist:  Peter Martinique, MD EP: None  Chief Complaint  Patient presents with  . Shortness of Breath  . Tachycardia      History of Present Illness: Melinda Gonzales is a 84 y.o. female with PMH of HOCM s/p septal myectomy with MVR for management of severe MR and MAZE in 2017, paroxysmal atrial fibrillation on xarelto, HLD, and CKD stage 2-3, who presents for follow-up of SOB and tachycardia.  She was last evaluated by cardiology at an outpatient visit with myself 10/09/2018 and had complaints of DOE and tachycardia at that time. She was recommended to take lasix 40mg  x3 days, then 20mg  daily for presumed acute on chronic diastolic CHF. She was asked to restart metoprolol 12.5mg  BID for her tachycardia. She underwent an echocardiogram 10/15/2018 which showed EF 25-30%, diffuse LV hypokinesis, moderately reduced systolic function, moderately dilated LA, moderate TR, and stable mitral valve s/p MVR. Prior to this her EF was 55-60% 03/2017. Her last ischemic evaluation was a R/LHC in 2017 prior to her open heart surgery which showed non-obstructive CAD with 25%pLAD stenosis, 40% 1st diagonal stenosis, and 20% pRCA stenosis.  She returns today for close follow-up of her symptoms. We reviewed her echocardiogram results in detail. She has had no significant improvement in symptoms with recent medication changes. She noticed some improvement in LE edema with lasix, however this has since returned. She reports SOB with minimal activity and orthopnea. HR log reviewed with HR persistently in the upper 110s to low 120s despite addition of metoprolol 12.5mg  BID last week. She reports some nausea with metoprolol. No complaints of chest pain or erratic heart beats. She continues to feel like this all started with sudden onset tachycardia on 09/30/2018 when she went in for  cataract surgery.     Past Medical History:  Diagnosis Date  . Chronic diastolic CHF (congestive heart failure) ([place]) 07/06/2015  . Dyspnea   . Heart murmur   . Hypercholesterolemia   . Hypertrophic obstructive cardiomyopathy (HOCM) ([place])   . LVH (left ventricular hypertrophy)    WITH NORMAL SYSTOLIC FUNCTION  . SOB (shortness of breath)     Past Surgical History:  Procedure Laterality Date  . CARDIAC CATHETERIZATION N/A 07/06/2015   Procedure: Right/Left Heart Cath and Coronary Angiography;  Surgeon: Peter M Martinique, MD;  Location: [facility];  Service: Cardiovascular;  Laterality: N/A;  . MITRAL VALVE REPLACEMENT N/A 11/15/2015   ligation of left atrial appendage, maze procedure at [facility]     Current Outpatient Medications  Medication Sig Dispense Refill  . furosemide (LASIX) 40 MG tablet Take 40mg  (1 tab) by mouth twice daily for 3 days; then take 40mg  once daily. 36 tablet 0  . Multiple Vitamins-Minerals (PRESERVISION AREDS 2 PO) Take 1 capsule by mouth 2 (two) times daily.    . Rivaroxaban (XARELTO) 15 MG TABS tablet Take 1 tablet (15 mg total) by mouth daily with supper. 90 tablet 3  . rosuvastatin (CRESTOR) 5 MG tablet Take 1 tablet (5 mg total) daily by mouth. 90 tablet 3  . carvedilol (COREG) 6.25 MG tablet Take 1 tablet (6.25 mg total) by mouth 2 (two) times daily. 60 tablet 3  . potassium chloride SA (K-DUR) 20 MEQ tablet Take 1 tablet (20 mEq total) by mouth daily. 90 tablet 3   No current facility-administered  medications for this visit.     Allergies:   Oxycodone    Social History:  The patient  reports that she quit smoking about 53 years ago. She has never used smokeless tobacco. She reports that she does not drink alcohol or use drugs.   Family History:  The patient's family history includes Heart attack (age of onset: 44) in her father; Heart failure (age of onset: 84) in her mother.    ROS:  Please see the history of present illness.    Otherwise, review of systems are positive for none.   All other systems are reviewed and negative.    PHYSICAL EXAM: VS:  BP 122/76   Pulse (!) 119   Ht 5' 1.5" (1.562 m)   Wt 179 lb (81.2 kg)   BMI 33.27 kg/m  , BMI Body mass index is 33.27 kg/m. GEN: Well nourished, well developed, in no acute distress HEENT: sclera anicteric. Neck: no JVD, carotid bruits, or masses Cardiac: tachycardic, regular rhythm; no murmurs, rubs, or gallops, 1+ LE edema  Respiratory:  clear to auscultation bilaterally, still with sim increased work of breathing, though does appear improved from last week.  GI: soft, nontender, nondistended, + BS MS: no deformity or atrophy Skin: warm and dry, no rash Neuro:  Strength and sensation are intact Psych: euthymic mood, full affect   EKG:  EKG is ordered today. The ekg ordered today demonstrates sinus tachycardia with PVCs, rate 119bpm, chronic LBBB; P waves not clear in all leads but most apparent in V1-2, suggesting this is sinus.    Recent Labs: 10/09/2018: BNP 467.5; Hemoglobin 12.8; Platelets 241    Lipid Panel    Component Value Date/Time   CHOL 196 02/24/2016 0839   TRIG 115 02/24/2016 0839   HDL 58 02/24/2016 0839   CHOLHDL 3.4 02/24/2016 0839   CHOLHDL 4.2 05/19/2015 0754   VLDL 30 05/19/2015 0754   LDLCALC 115 (H) 02/24/2016 0839      Wt Readings from Last 3 Encounters:  10/17/18 179 lb (81.2 kg)  10/09/18 180 lb 12.8 oz (82 kg)  04/27/17 184 lb (83.5 kg)      Other studies Reviewed: Additional studies/ records that were reviewed today include:   Echocardiogram 10/15/2018: 1. The left ventricle has severely reduced systolic function, with an ejection fraction of 25-30%. The cavity size was normal. Left ventricular diastolic function could not be evaluated. Elevated mean left atrial pressure Left ventricular diffuse  hypokinesis.  2. The right ventricle has moderately reduced systolic function. The cavity was normal. Right ventricular  systolic pressure is mildly elevated with an estimated pressure of 41.8 mmHg.  3. Left atrial size was moderately dilated.  4. The tricuspid valve is grossly normal. Tricuspid valve regurgitation is moderate.  5. The aortic valve has an indeterminate number of cusps. Mild calcification of the aortic valve. Aortic valve regurgitation is trivial by color flow Doppler. No stenosis of the aortic valve.  6. The aorta is normal unless otherwise noted.  7. The inferior vena cava was dilated in size with >50% respiratory variability.  8. Septal dyssynergy and global hypokinesis; overall severe LV dysfunction; trace AI; s/p MVR with no MR; moderate LAE; moderate RV dysfunction; moderate TR and mild pulmonary hypertension.  Echocardiogram 03/2017: Study Conclusions  - Valve surgery. Mitral valve replacement with a [employer] porcine valve. 79mm Biocor bioprosthetic valve. - Left ventricle: The cavity size was normal. Wall thickness was increased in a pattern of mild LVH. Systolic function  was normal. The estimated ejection fraction was in the range of 55% to 60%. Wall motion was normal; there were no regional wall motion abnormalities. Doppler parameters are consistent with a reversible restrictive pattern, indicative of decreased left ventricular diastolic compliance and/or increased left atrial pressure (grade 3 diastolic dysfunction). - Aortic valve: There was trivial regurgitation. Mean gradient (S): 11 mm Hg. Peak gradient (S): 19 mm Hg. - Mitral valve: A bioprosthesis was present. The prosthesis had a normal range of motion. Mean gradient (D): 5 mm Hg. - Left atrium: The atrium was mildly dilated. - Pulmonary arteries: Systolic pressure was moderately increased. PA peak pressure: 56 mm Hg (S).  Right/Left heart catheterization 07/2015:  Ost LAD to Prox LAD lesion, 25% stenosed.  Ost 1st Diag to 1st Diag lesion, 40% stenosed.  Prox RCA to Mid RCA lesion, 20%  stenosed.  1. Nonobstructive CAD 2. Moderate to severe pulmonary HTN with elevated LV filling pressures. 3. Good cardiac output.  4. Mild LV gradient by pullback. Significant post PVC increase in gradient consistent with dynamic outflow obstruction.   Plan: consider surgical septal myectomy   ASSESSMENT AND PLAN:  1. Acute combined CHF: patient presents with persistent DOE and tachycardia. Echo 10/15/2018 revealed EF 25-30% (previously 55-60% 03/2017), diffuse LV hypokinesis, and stable MVR. She reported some improvement in SOB and LE edema with lasix 40mg  daily x3 days, though this returned with taking 20mg  daily. Her last ischemic evaluation was a R/LHC in 2017 prior to open heart surgery which showed mild-moderate non-obstructive CAD - would be surprised if this has progressed to obstructive disease. Likely this is tachycardia mediated, though unclear what is driving her tachycardia.  - Will transition from metoprolol to carvedilol 6.25mg  BID - Will increase lasix to 40mg  BID x3 days, then 40mg  daily going forward.  - Will send potassium 44mEq to be taken with lasix - Could consider addition of entresto if room in BP/Cr  - Will discontinue diltiazem at this time.  - Will check a TSH today for possible thryoid dysfunction etiology - Appointment scheduled with Dr. Martinique 10/28/2018 for close follow-up and consideration of [facility] for an ischemic evaluation vs medical management with repeat echo in 3 months to monitor for improvement in EF.  - We discussed ER precautions if symptoms worsen or she develops chest pain.   2. Tachycardia: EKG today again with what appears to be sinus tachycardia with rate 119 bpm. Question whether patient has been having atrial flutter. She is currently wearing a zio patch monitor which was placed 10/16/2018.  - Hopeful zio patch will shed some light on her tachycardia - sinus vs atrial flutter - Will transition from metoprolol to carvedilol 6.25mg  BID for rate  control - She is on xarelto for history of paroxysmal atrial fibrillation so is covered if her monitor does in fact reveal atrial flutter  3. HOCM: s/p septal myectomy in 2017. Stable on echo 03/2017.She is now experiencing DOE which she states is reminiscent to before her open heart surgery.   - Diuresis as above  4. Paroxysmal atrial fibrillation: episode occurred post-op after septal myectomy with MVR and MAZE. EKG today with sinus tachycardia rhythm. No complaints of bleeding on xarelto.  - Currently undergoing a 3 day zio patch to evaluate for recurrent arrhythmia - Will transition from metoprolol to carvedilol as above for rate control - Will stop diltiazem given newly reduced EF - Continue xarelto for stroke ppx  5. Non-obstructive CAD: noted to have mild disease on [facility]  in 2017.  - Unlikely DOE represents an anginal equivalent, though could consider an ischemic evaluation given newly reduced EF. Will defer to Dr. Martinique - patient scheduled for an appointment 10/28/2018.  - Continue crestor - Will start carvedilol 6.25mg  BID for management of #1 and 2.  6. Severe MR s/p MVR in 2017: valve appeared stable on echo 10/15/2018. - Continue SBE ppx for dental cleanings  7. HLD: LDL 70 on labs 07/2017 - Continue crestor  8. CKD stage 3: Cr 1.26 on labs 01/2018 - Will check a BMET today   Plan reviewed with Dr. Percival Spanish (DOD) today.   Current medicines are reviewed at length with the patient today.  The patient does not have concerns regarding medicines.  The following changes have been made:  STOP metoprolol and diltiazem. START carvedilol 6.25mg  BID. CHANGE lasix to 40mg  BID x3 days, then 40mg  daily going forward; take potassium 20 mEq with lasix dose  Labs/ tests ordered today include:    Orders Placed This Encounter  Procedures  . Basic metabolic panel  . TSH  . EKG 12-Lead     Disposition:   FU with Dr. Martinique 10/28/2018.  Signed, Melinda Butts, PA-C  10/17/2018  3:22 PM       EKG reviewed by Dr. Curt Bears. Felt that patient has atrial flutter with RVR. Recommended for DCCV. Patient contacted 10/18/2018 and confirms she has not missed any doses of her xarelto in the past month. We discussed risks/benefits of DCCV and she agrees to proceed. Patient scheduled for DCCV with Dr. Gardiner Rhyme 10/23/2018 at 10:00am. COVID-19 testing arranged. Patient had BMET 10/17/2018 but will need to have CBC testing on the day of her procedure due to quarantine restrictions. Plan discussed with Dr. Martinique who is in agreement.   Melinda Butts, PA-C 10/18/18; 5:00 PM

## 2018-10-17 NOTE — H&P (View-Only) (Signed)
Cardiology Office Note   Date:  10/17/2018   ID:  Melinda Gonzales, Melinda Gonzales November 06, 1934, MRN [id number]  PCP:  Kelton Pillar, MD  Cardiologist:  Peter Martinique, MD EP: None  Chief Complaint  Patient presents with  . Shortness of Breath  . Tachycardia      History of Present Illness: Melinda Gonzales is a 84 y.o. female with PMH of HOCM s/p septal myectomy with MVR for management of severe MR and MAZE in 2017, paroxysmal atrial fibrillation on xarelto, HLD, and CKD stage 2-3, who presents for follow-up of SOB and tachycardia.  She was last evaluated by cardiology at an outpatient visit with myself 10/09/2018 and had complaints of DOE and tachycardia at that time. She was recommended to take lasix 40mg  x3 days, then 20mg  daily for presumed acute on chronic diastolic CHF. She was asked to restart metoprolol 12.5mg  BID for her tachycardia. She underwent an echocardiogram 10/15/2018 which showed EF 25-30%, diffuse LV hypokinesis, moderately reduced systolic function, moderately dilated LA, moderate TR, and stable mitral valve s/p MVR. Prior to this her EF was 55-60% 03/2017. Her last ischemic evaluation was a R/LHC in 2017 prior to her open heart surgery which showed non-obstructive CAD with 25%pLAD stenosis, 40% 1st diagonal stenosis, and 20% pRCA stenosis.  She returns today for close follow-up of her symptoms. We reviewed her echocardiogram results in detail. She has had no significant improvement in symptoms with recent medication changes. She noticed some improvement in LE edema with lasix, however this has since returned. She reports SOB with minimal activity and orthopnea. HR log reviewed with HR persistently in the upper 110s to low 120s despite addition of metoprolol 12.5mg  BID last week. She reports some nausea with metoprolol. No complaints of chest pain or erratic heart beats. She continues to feel like this all started with sudden onset tachycardia on 09/30/2018 when she went in for  cataract surgery.     Past Medical History:  Diagnosis Date  . Chronic diastolic CHF (congestive heart failure) ([place]) 07/06/2015  . Dyspnea   . Heart murmur   . Hypercholesterolemia   . Hypertrophic obstructive cardiomyopathy (HOCM) ([place])   . LVH (left ventricular hypertrophy)    WITH NORMAL SYSTOLIC FUNCTION  . SOB (shortness of breath)     Past Surgical History:  Procedure Laterality Date  . CARDIAC CATHETERIZATION N/A 07/06/2015   Procedure: Right/Left Heart Cath and Coronary Angiography;  Surgeon: Peter M Martinique, MD;  Location: [place] [facility];  Service: Cardiovascular;  Laterality: N/A;  . MITRAL VALVE REPLACEMENT N/A 11/15/2015   ligation of left atrial appendage, maze procedure at [facility]     Current Outpatient Medications  Medication Sig Dispense Refill  . furosemide (LASIX) 40 MG tablet Take 40mg  (1 tab) by mouth twice daily for 3 days; then take 40mg  once daily. 36 tablet 0  . Multiple Vitamins-Minerals (PRESERVISION AREDS 2 PO) Take 1 capsule by mouth 2 (two) times daily.    . Rivaroxaban (XARELTO) 15 MG TABS tablet Take 1 tablet (15 mg total) by mouth daily with supper. 90 tablet 3  . rosuvastatin (CRESTOR) 5 MG tablet Take 1 tablet (5 mg total) daily by mouth. 90 tablet 3  . carvedilol (COREG) 6.25 MG tablet Take 1 tablet (6.25 mg total) by mouth 2 (two) times daily. 60 tablet 3  . potassium chloride SA (K-DUR) 20 MEQ tablet Take 1 tablet (20 mEq total) by mouth daily. 90 tablet 3   No current facility-administered  medications for this visit.     Allergies:   Oxycodone    Social History:  The patient  reports that she quit smoking about 53 years ago. She has never used smokeless tobacco. She reports that she does not drink alcohol or use drugs.   Family History:  The patient's family history includes Heart attack (age of onset: 71) in her father; Heart failure (age of onset: 72) in her mother.    ROS:  Please see the history of present illness.    Otherwise, review of systems are positive for none.   All other systems are reviewed and negative.    PHYSICAL EXAM: VS:  BP 122/76   Pulse (!) 119   Ht 5' 1.5" (1.562 m)   Wt 179 lb (81.2 kg)   BMI 33.27 kg/m  , BMI Body mass index is 33.27 kg/m. GEN: Well nourished, well developed, in no acute distress HEENT: sclera anicteric. Neck: no JVD, carotid bruits, or masses Cardiac: tachycardic, regular rhythm; no murmurs, rubs, or gallops, 1+ LE edema  Respiratory:  clear to auscultation bilaterally, still with sim increased work of breathing, though does appear improved from last week.  GI: soft, nontender, nondistended, + BS MS: no deformity or atrophy Skin: warm and dry, no rash Neuro:  Strength and sensation are intact Psych: euthymic mood, full affect   EKG:  EKG is ordered today. The ekg ordered today demonstrates sinus tachycardia with PVCs, rate 119bpm, chronic LBBB; P waves not clear in all leads but most apparent in V1-2, suggesting this is sinus.    Recent Labs: 10/09/2018: BNP 467.5; Hemoglobin 12.8; Platelets 241    Lipid Panel    Component Value Date/Time   CHOL 196 02/24/2016 0839   TRIG 115 02/24/2016 0839   HDL 58 02/24/2016 0839   CHOLHDL 3.4 02/24/2016 0839   CHOLHDL 4.2 05/19/2015 0754   VLDL 30 05/19/2015 0754   LDLCALC 115 (H) 02/24/2016 0839      Wt Readings from Last 3 Encounters:  10/17/18 179 lb (81.2 kg)  10/09/18 180 lb 12.8 oz (82 kg)  04/27/17 184 lb (83.5 kg)      Other studies Reviewed: Additional studies/ records that were reviewed today include:   Echocardiogram 10/15/2018: 1. The left ventricle has severely reduced systolic function, with an ejection fraction of 25-30%. The cavity size was normal. Left ventricular diastolic function could not be evaluated. Elevated mean left atrial pressure Left ventricular diffuse  hypokinesis.  2. The right ventricle has moderately reduced systolic function. The cavity was normal. Right ventricular  systolic pressure is mildly elevated with an estimated pressure of 41.8 mmHg.  3. Left atrial size was moderately dilated.  4. The tricuspid valve is grossly normal. Tricuspid valve regurgitation is moderate.  5. The aortic valve has an indeterminate number of cusps. Mild calcification of the aortic valve. Aortic valve regurgitation is trivial by color flow Doppler. No stenosis of the aortic valve.  6. The aorta is normal unless otherwise noted.  7. The inferior vena cava was dilated in size with >50% respiratory variability.  8. Septal dyssynergy and global hypokinesis; overall severe LV dysfunction; trace AI; s/p MVR with no MR; moderate LAE; moderate RV dysfunction; moderate TR and mild pulmonary hypertension.  Echocardiogram 03/2017: Study Conclusions  - Valve surgery. Mitral valve replacement with a [employer] porcine valve. 67mm Biocor bioprosthetic valve. - Left ventricle: The cavity size was normal. Wall thickness was increased in a pattern of mild LVH. Systolic function  was normal. The estimated ejection fraction was in the range of 55% to 60%. Wall motion was normal; there were no regional wall motion abnormalities. Doppler parameters are consistent with a reversible restrictive pattern, indicative of decreased left ventricular diastolic compliance and/or increased left atrial pressure (grade 3 diastolic dysfunction). - Aortic valve: There was trivial regurgitation. Mean gradient (S): 11 mm Hg. Peak gradient (S): 19 mm Hg. - Mitral valve: A bioprosthesis was present. The prosthesis had a normal range of motion. Mean gradient (D): 5 mm Hg. - Left atrium: The atrium was mildly dilated. - Pulmonary arteries: Systolic pressure was moderately increased. PA peak pressure: 56 mm Hg (S).  Right/Left heart catheterization 07/2015:  Ost LAD to Prox LAD lesion, 25% stenosed.  Ost 1st Diag to 1st Diag lesion, 40% stenosed.  Prox RCA to Mid RCA lesion, 20%  stenosed.  1. Nonobstructive CAD 2. Moderate to severe pulmonary HTN with elevated LV filling pressures. 3. Good cardiac output.  4. Mild LV gradient by pullback. Significant post PVC increase in gradient consistent with dynamic outflow obstruction.   Plan: consider surgical septal myectomy   ASSESSMENT AND PLAN:  1. Acute combined CHF: patient presents with persistent DOE and tachycardia. Echo 10/15/2018 revealed EF 25-30% (previously 55-60% 03/2017), diffuse LV hypokinesis, and stable MVR. She reported some improvement in SOB and LE edema with lasix 40mg  daily x3 days, though this returned with taking 20mg  daily. Her last ischemic evaluation was a R/LHC in 2017 prior to open heart surgery which showed mild-moderate non-obstructive CAD - would be surprised if this has progressed to obstructive disease. Likely this is tachycardia mediated, though unclear what is driving her tachycardia.  - Will transition from metoprolol to carvedilol 6.25mg  BID - Will increase lasix to 40mg  BID x3 days, then 40mg  daily going forward.  - Will send potassium 28mEq to be taken with lasix - Could consider addition of entresto if room in BP/Cr  - Will discontinue diltiazem at this time.  - Will check a TSH today for possible thryoid dysfunction etiology - Appointment scheduled with Dr. Martinique 10/28/2018 for close follow-up and consideration of [facility] for an ischemic evaluation vs medical management with repeat echo in 3 months to monitor for improvement in EF.  - We discussed ER precautions if symptoms worsen or she develops chest pain.   2. Tachycardia: EKG today again with what appears to be sinus tachycardia with rate 119 bpm. Question whether patient has been having atrial flutter. She is currently wearing a zio patch monitor which was placed 10/16/2018.  - Hopeful zio patch will shed some light on her tachycardia - sinus vs atrial flutter - Will transition from metoprolol to carvedilol 6.25mg  BID for rate  control - She is on xarelto for history of paroxysmal atrial fibrillation so is covered if her monitor does in fact reveal atrial flutter  3. HOCM: s/p septal myectomy in 2017. Stable on echo 03/2017.She is now experiencing DOE which she states is reminiscent to before her open heart surgery.   - Diuresis as above  4. Paroxysmal atrial fibrillation: episode occurred post-op after septal myectomy with MVR and MAZE. EKG today with sinus tachycardia rhythm. No complaints of bleeding on xarelto.  - Currently undergoing a 3 day zio patch to evaluate for recurrent arrhythmia - Will transition from metoprolol to carvedilol as above for rate control - Will stop diltiazem given newly reduced EF - Continue xarelto for stroke ppx  5. Non-obstructive CAD: noted to have mild disease on [facility]  in 2017.  - Unlikely DOE represents an anginal equivalent, though could consider an ischemic evaluation given newly reduced EF. Will defer to Dr. Martinique - patient scheduled for an appointment 10/28/2018.  - Continue crestor - Will start carvedilol 6.25mg  BID for management of #1 and 2.  6. Severe MR s/p MVR in 2017: valve appeared stable on echo 10/15/2018. - Continue SBE ppx for dental cleanings  7. HLD: LDL 70 on labs 07/2017 - Continue crestor  8. CKD stage 3: Cr 1.26 on labs 01/2018 - Will check a BMET today   Plan reviewed with Dr. Percival Spanish (DOD) today.   Current medicines are reviewed at length with the patient today.  The patient does not have concerns regarding medicines.  The following changes have been made:  STOP metoprolol and diltiazem. START carvedilol 6.25mg  BID. CHANGE lasix to 40mg  BID x3 days, then 40mg  daily going forward; take potassium 20 mEq with lasix dose  Labs/ tests ordered today include:    Orders Placed This Encounter  Procedures  . Basic metabolic panel  . TSH  . EKG 12-Lead     Disposition:   FU with Dr. Martinique 10/28/2018.  Signed, Abigail Butts, PA-C  10/17/2018  3:22 PM       EKG reviewed by Dr. Curt Bears. Felt that patient has atrial flutter with RVR. Recommended for DCCV. Patient contacted 10/18/2018 and confirms she has not missed any doses of her xarelto in the past month. We discussed risks/benefits of DCCV and she agrees to proceed. Patient scheduled for DCCV with Dr. Gardiner Rhyme 10/23/2018 at 10:00am. COVID-19 testing arranged. Patient had BMET 10/17/2018 but will need to have CBC testing on the day of her procedure due to quarantine restrictions. Plan discussed with Dr. Martinique who is in agreement.   Abigail Butts, PA-C 10/18/18; 5:00 PM

## 2018-10-18 ENCOUNTER — Other Ambulatory Visit: Payer: Self-pay

## 2018-10-18 ENCOUNTER — Other Ambulatory Visit: Payer: Self-pay | Admitting: Medical

## 2018-10-18 DIAGNOSIS — E039 Hypothyroidism, unspecified: Secondary | ICD-10-CM

## 2018-10-18 DIAGNOSIS — R5383 Other fatigue: Secondary | ICD-10-CM

## 2018-10-18 DIAGNOSIS — R7989 Other specified abnormal findings of blood chemistry: Secondary | ICD-10-CM

## 2018-10-18 LAB — BASIC METABOLIC PANEL
BUN/Creatinine Ratio: 15 (ref 12–28)
BUN: 20 mg/dL (ref 8–27)
CO2: 21 mmol/L (ref 20–29)
Calcium: 9.3 mg/dL (ref 8.7–10.3)
Chloride: 105 mmol/L (ref 96–106)
Creatinine, Ser: 1.33 mg/dL — ABNORMAL HIGH (ref 0.57–1.00)
GFR calc Af Amer: 43 mL/min/{1.73_m2} — ABNORMAL LOW (ref >59)
GFR calc non Af Amer: 37 mL/min/{1.73_m2} — ABNORMAL LOW (ref >59)
Glucose: 102 mg/dL — ABNORMAL HIGH (ref 65–99)
Potassium: 4.3 mmol/L (ref 3.5–5.2)
Sodium: 142 mmol/L (ref 134–144)

## 2018-10-18 LAB — TSH: TSH: 6.09 u[IU]/mL — ABNORMAL HIGH (ref 0.450–4.500)

## 2018-10-18 NOTE — Progress Notes (Signed)
Orders placed.

## 2018-10-19 ENCOUNTER — Other Ambulatory Visit (HOSPITAL_COMMUNITY)
Admission: RE | Admit: 2018-10-19 | Discharge: 2018-10-19 | Disposition: A | Discharge status: Home / Self Care | Payer: Medicare Other | Source: Ambulatory Visit | Attending: Cardiology | Admitting: Cardiology

## 2018-10-19 DIAGNOSIS — Z20828 Contact with and (suspected) exposure to other viral communicable diseases: Secondary | ICD-10-CM | POA: Diagnosis not present

## 2018-10-19 DIAGNOSIS — Z01812 Encounter for preprocedural laboratory examination: Secondary | ICD-10-CM | POA: Insufficient documentation

## 2018-10-20 LAB — NOVEL CORONAVIRUS, NAA (HOSP ORDER, SEND-OUT TO REF LAB; TAT 18-24 HRS): SARS-CoV-2, NAA: NOT DETECTED

## 2018-10-21 ENCOUNTER — Telehealth: Payer: Self-pay | Admitting: Cardiology

## 2018-10-21 NOTE — Telephone Encounter (Signed)
Spoke to patient advised husband can wait in waiting area at hospital.Advised he will need to wear a mask.

## 2018-10-21 NOTE — Telephone Encounter (Signed)
Procedure scheduled with Dr. Gardiner Rhyme for patient of Dr. Martinique Routed to primary concerning question

## 2018-10-21 NOTE — Telephone Encounter (Signed)
New Message  Patient is scheduled for a Cardioversion on 10/23/18 and would like to know if her husband will be able to accompany her. Please give patient a call back to confirm.

## 2018-10-23 ENCOUNTER — Encounter (HOSPITAL_COMMUNITY): Payer: Self-pay | Admitting: *Deleted

## 2018-10-23 ENCOUNTER — Encounter (HOSPITAL_COMMUNITY)
Admission: RE | Disposition: A | Discharge status: Home / Self Care | Payer: Self-pay | Source: Home / Self Care | Attending: Cardiology

## 2018-10-23 ENCOUNTER — Other Ambulatory Visit: Payer: Self-pay

## 2018-10-23 ENCOUNTER — Ambulatory Visit (HOSPITAL_COMMUNITY): Payer: Medicare Other | Admitting: Certified Registered Nurse Anesthetist

## 2018-10-23 ENCOUNTER — Ambulatory Visit (HOSPITAL_COMMUNITY)
Admission: RE | Admit: 2018-10-23 | Discharge: 2018-10-23 | Disposition: A | Discharge status: Home / Self Care | Payer: Medicare Other | Attending: Cardiology | Admitting: Cardiology

## 2018-10-23 DIAGNOSIS — I421 Obstructive hypertrophic cardiomyopathy: Secondary | ICD-10-CM | POA: Diagnosis not present

## 2018-10-23 DIAGNOSIS — I4892 Unspecified atrial flutter: Secondary | ICD-10-CM | POA: Diagnosis present

## 2018-10-23 DIAGNOSIS — I480 Paroxysmal atrial fibrillation: Secondary | ICD-10-CM | POA: Diagnosis not present

## 2018-10-23 DIAGNOSIS — Z7901 Long term (current) use of anticoagulants: Secondary | ICD-10-CM | POA: Diagnosis not present

## 2018-10-23 DIAGNOSIS — I272 Pulmonary hypertension, unspecified: Secondary | ICD-10-CM | POA: Insufficient documentation

## 2018-10-23 DIAGNOSIS — I5032 Chronic diastolic (congestive) heart failure: Secondary | ICD-10-CM | POA: Diagnosis not present

## 2018-10-23 DIAGNOSIS — E7800 Pure hypercholesterolemia, unspecified: Secondary | ICD-10-CM | POA: Diagnosis not present

## 2018-10-23 DIAGNOSIS — I2510 Atherosclerotic heart disease of native coronary artery without angina pectoris: Secondary | ICD-10-CM | POA: Insufficient documentation

## 2018-10-23 DIAGNOSIS — Z885 Allergy status to narcotic agent status: Secondary | ICD-10-CM | POA: Insufficient documentation

## 2018-10-23 DIAGNOSIS — R000 Tachycardia, unspecified: Secondary | ICD-10-CM | POA: Insufficient documentation

## 2018-10-23 DIAGNOSIS — I4891 Unspecified atrial fibrillation: Secondary | ICD-10-CM | POA: Diagnosis not present

## 2018-10-23 DIAGNOSIS — N183 Chronic kidney disease, stage 3 (moderate): Secondary | ICD-10-CM | POA: Diagnosis not present

## 2018-10-23 DIAGNOSIS — E785 Hyperlipidemia, unspecified: Secondary | ICD-10-CM | POA: Insufficient documentation

## 2018-10-23 DIAGNOSIS — Z79899 Other long term (current) drug therapy: Secondary | ICD-10-CM | POA: Insufficient documentation

## 2018-10-23 DIAGNOSIS — Z8249 Family history of ischemic heart disease and other diseases of the circulatory system: Secondary | ICD-10-CM | POA: Insufficient documentation

## 2018-10-23 DIAGNOSIS — Z87891 Personal history of nicotine dependence: Secondary | ICD-10-CM | POA: Diagnosis not present

## 2018-10-23 DIAGNOSIS — I110 Hypertensive heart disease with heart failure: Secondary | ICD-10-CM | POA: Diagnosis not present

## 2018-10-23 DIAGNOSIS — Z952 Presence of prosthetic heart valve: Secondary | ICD-10-CM | POA: Insufficient documentation

## 2018-10-23 HISTORY — PX: CARDIOVERSION: SHX1299

## 2018-10-23 LAB — CBC WITH DIFFERENTIAL/PLATELET
Abs Immature Granulocytes: 0.04 10*3/uL (ref 0.00–0.07)
Basophils Absolute: 0 10*3/uL (ref 0.0–0.1)
Basophils Relative: 0 %
Eosinophils Absolute: 0.1 10*3/uL (ref 0.0–0.5)
Eosinophils Relative: 1 %
HCT: 40.5 % (ref 36.0–46.0)
Hemoglobin: 13.4 g/dL (ref 12.0–15.0)
Immature Granulocytes: 1 %
Lymphocytes Relative: 15 %
Lymphs Abs: 1.3 10*3/uL (ref 0.7–4.0)
MCH: 31.2 pg (ref 26.0–34.0)
MCHC: 33.1 g/dL (ref 30.0–36.0)
MCV: 94.2 fL (ref 80.0–100.0)
Monocytes Absolute: 0.7 10*3/uL (ref 0.1–1.0)
Monocytes Relative: 8 %
Neutro Abs: 6.5 10*3/uL (ref 1.7–7.7)
Neutrophils Relative %: 75 %
Platelets: 230 10*3/uL (ref 150–400)
RBC: 4.3 MIL/uL (ref 3.87–5.11)
RDW: 14.1 % (ref 11.5–15.5)
WBC: 8.6 10*3/uL (ref 4.0–10.5)
nRBC: 0 % (ref 0.0–0.2)

## 2018-10-23 SURGERY — CARDIOVERSION
Anesthesia: General

## 2018-10-23 MED ORDER — SODIUM CHLORIDE 0.9% FLUSH: 3.0000 mL | Freq: Two times a day (BID) | INTRAVENOUS | Status: DC

## 2018-10-23 MED ORDER — SODIUM CHLORIDE 0.9 % IV SOLN: 250.0000 mL | INTRAVENOUS | Status: DC

## 2018-10-23 MED ORDER — SODIUM CHLORIDE 0.9 % IV SOLN
INTRAVENOUS | Status: DC
  Administered 2018-10-23: 500 mL via INTRAVENOUS

## 2018-10-23 MED ORDER — LIDOCAINE 2% (20 MG/ML) 5 ML SYRINGE
INTRAMUSCULAR | Status: DC | PRN
  Administered 2018-10-23: 50 mg via INTRAVENOUS

## 2018-10-23 MED ORDER — SODIUM CHLORIDE 0.9% FLUSH: 3.0000 mL | INTRAVENOUS | Status: DC | PRN

## 2018-10-23 MED ORDER — PROPOFOL 10 MG/ML IV BOLUS
INTRAVENOUS | Status: DC | PRN
  Administered 2018-10-23: 50 mg via INTRAVENOUS

## 2018-10-23 NOTE — Anesthesia Preprocedure Evaluation (Signed)
Anesthesia Evaluation  Patient identified by MRN, date of birth, ID band Patient awake    Reviewed: Allergy & Precautions, NPO status , Patient's Chart, lab work & pertinent test results, reviewed documented beta blocker date and time   Airway Mallampati: II  TM Distance: >3 FB     Dental no notable dental hx.    Pulmonary shortness of breath and with exertion, former smoker,    Pulmonary exam normal breath sounds clear to auscultation       Cardiovascular hypertension, Pt. on medications and Pt. on home beta blockers + CAD, +CHF and + DOE  Normal cardiovascular exam+ Valvular Problems/Murmurs MR  Rhythm:Regular Rate:Tachycardia  HOCM S/P septal myectomy S/P MVR Echo 10/15/2018  1. The left ventricle has severely reduced systolic function, with an ejection fraction of 25-30%. The cavity size was normal. Left ventricular diastolic function could not be evaluated. Elevated mean left atrial pressure Left ventricular diffuse hypokinesis.  2. The right ventricle has moderately reduced systolic function. The cavity was normal. Right ventricular systolic pressure is mildly elevated with an estimated pressure of 41.8 mmHg.  3. Left atrial size was moderately dilated.  4. The tricuspid valve is grossly normal. Tricuspid valve regurgitation is moderate.  5. The aortic valve has an indeterminate number of cusps. Mild calcification of the aortic valve. Aortic valve regurgitation is trivial by color flow Doppler. No stenosis of the aortic valve.  6. The aorta is normal unless otherwise noted.  7. The inferior vena cava was dilated in size with >50% respiratory variability.  8. Septal dyssynergy and global hypokinesis; overall severe LV dysfunction; trace AI; s/p MVR with no MR; moderate LAE; moderate RV dysfunction; moderate TR and mild pulmonary hypertension.  EKG 10/23/2018 ST, LBBB pattern   Neuro/Psych negative neurological ROS  negative  psych ROS   GI/Hepatic negative GI ROS, Neg liver ROS,   Endo/Other  Hyperlipidemia Obesity  Renal/GU Renal InsufficiencyRenal disease  negative genitourinary   Musculoskeletal negative musculoskeletal ROS (+)   Abdominal (+) + obese,   Peds  Hematology Xarelto - last dose 10/22/2018   Anesthesia Other Findings   Reproductive/Obstetrics                             Anesthesia Physical Anesthesia Plan  ASA: III  Anesthesia Plan: General   Post-op Pain Management:    Induction:   PONV Risk Score and Plan: 3 and Ondansetron and Treatment may vary due to age or medical condition  Airway Management Planned: Natural Airway and Mask  Additional Equipment:   Intra-op Plan:   Post-operative Plan:   Informed Consent: I have reviewed the patients History and Physical, chart, labs and discussed the procedure including the risks, benefits and alternatives for the proposed anesthesia with the patient or authorized representative who has indicated his/her understanding and acceptance.       Plan Discussed with: CRNA and Surgeon  Anesthesia Plan Comments:         Anesthesia Quick Evaluation

## 2018-10-23 NOTE — Discharge Instructions (Signed)

## 2018-10-23 NOTE — Interval H&P Note (Signed)
History and Physical Interval Note:  10/23/2018 10:01 AM  Melinda Gonzales  has presented today for surgery, with the diagnosis of atrial fibrillation.  The various methods of treatment have been discussed with the patient and family. After consideration of risks, benefits and other options for treatment, the patient has consented to  Procedure(s): CARDIOVERSION (N/A) as a surgical intervention.  The patient's history has been reviewed, patient examined, no change in status, stable for surgery.  I have reviewed the patient's chart and labs.  Questions were answered to the patient's satisfaction.     Donato Heinz

## 2018-10-23 NOTE — Anesthesia Procedure Notes (Addendum)
Procedure Name: General with mask airway Date/Time: 10/23/2018 8:54 AM Performed by: Elayne Snare, CRNA Pre-anesthesia Checklist: Patient identified, Emergency Drugs available, Suction available and Patient being monitored Patient Re-evaluated:Patient Re-evaluated prior to induction Oxygen Delivery Method: Ambu bag Preoxygenation: Pre-oxygenation with 100% oxygen

## 2018-10-23 NOTE — Anesthesia Postprocedure Evaluation (Signed)
Anesthesia Post Note  Patient: Melinda Gonzales  Procedure(s) Performed: CARDIOVERSION (N/A )     Patient location during evaluation: PACU Anesthesia Type: General Level of consciousness: awake and alert and oriented Pain management: pain level controlled Vital Signs Assessment: post-procedure vital signs reviewed and stable Respiratory status: spontaneous breathing, nonlabored ventilation and respiratory function stable Cardiovascular status: blood pressure returned to baseline and stable Postop Assessment: no apparent nausea or vomiting Anesthetic complications: no    Last Vitals:  Vitals:   10/23/18 1002 10/23/18 1008  BP: (!) 116/56 126/79  Pulse: 68 66  Resp: (!) 24 (!) 30  Temp: 37.1 C   SpO2: 92% 93%    Last Pain:  Vitals:   10/23/18 1008  TempSrc:   PainSc: 0-No pain                 Perseus Westall A.

## 2018-10-23 NOTE — CV Procedure (Signed)
Procedure:   DCCV  Indication:  Symptomatic atrial flutter  Procedure Note:  The patient signed informed consent.  They have had had therapeutic anticoagulation with Xarelto greater than 3 weeks.  Anesthesia was administered by Dr. Royce Macadamia.  Adequate airway was maintained throughout and vital followed per protocol.  They were cardioverted x 1 with 100J of biphasic synchronized energy.  They converted to NSR.  There were no apparent complications.  The patient had normal neuro status and respiratory status post procedure with vitals stable as recorded elsewhere.    Follow up:  They will continue on current medical therapy and follow up with cardiology as scheduled.  Oswaldo Milian, MD 10/23/2018 10:11 AM

## 2018-10-23 NOTE — Transfer of Care (Signed)
Immediate Anesthesia Transfer of Care Note  Patient: Melinda Gonzales  Procedure(s) Performed: CARDIOVERSION (N/A )  Patient Location: Endoscopy Unit  Anesthesia Type:General  Level of Consciousness: drowsy and responds to stimulation  Airway & Oxygen Therapy: Patient Spontanous Breathing  Post-op Assessment: Report given to RN and Post -op Vital signs reviewed and stable  Post vital signs: Reviewed and stable  Last Vitals:  Vitals Value Taken Time  BP 126/64 10/23/18 1001  Temp    Pulse 68 10/23/18 1002  Resp 22 10/23/18 1002  SpO2 92 % 10/23/18 1002  Vitals shown include unvalidated device data.  Last Pain:  Vitals:   10/23/18 0850  TempSrc: Oral  PainSc: 0-No pain         Complications: No apparent anesthesia complications

## 2018-10-23 NOTE — Progress Notes (Signed)
Melinda Gonzales Date of Birth: 03-Aug-1934   History of Present Illness: [name] is seen for follow up of HOCM  She was evaluated in 2011 with a Myoview stress test which was normal. She had an Echo at that time that showed moderate LVH with EF 65-70%. Mild MR. She does have a history of murmur. She is not a smoker. In early 2016 she presented with increased dyspnea and an Echo and cardiac MRI were done. These with both consistent with HOCM. LVOT gradient of 112 mm Hg at rest. She was started on low dose lasix and Toprol XL with good response initially with improved dyspnea.  Seen last year with symptoms of increased dyspnea. No increased edema or orthopnea but more dyspnea with walking or lifting. No chest pain. No dizziness or palpitations. Energy level has decreased. Repeat Echo showed severe LVOT gradient and MR.  She underwent cardiac cath with findings of LVOT gradient and MR. No significant CAD. Pulmonary HTN. She was referred to the [facility] clinic and underwent surgery on 11/05/15. Prior to surgery she developed Afib and had a DCCV. Surgery  included a septal myectomy, MVR with a #29 mm St. Jude  Biocor valve, left sided Cryo MAZE, and ligation of the left atrial appendage. Her post op course was remarkable for paroxysmal atrial fibrillation and she was started on amiodarone and Xarelto.  Ecg showed a LBBB. Repeat Echo on 11/11/15 showed normal LV function with EF 60%. Mean MV gradient 8 mm Hg. Trace MR. Mild TR. There was a fixed LVOT gradient of 32 mm Hg peak and mean of 16 mm Hg. Repeat Echo 02/03/16 showed no LVOT gradient and normal MV prosthesis function.   She was seen by Roby Lofts PA-C earlier this month with symptoms of persistent tachycardia and SOB. HR in the 120s. Echo was obtained and showed significant decline in LV function with EF 25-30%. After review of Ecg tracings and reviewing with EP it appeared she was in Atrial flutter. BNP was 467. TSH was mildly elevated  6.0. She was started on Coreg. Lasix was increased she was on Xarelto. She underwent  DCCV on 10/23/18.   On follow up today she notes she still doesn't feel well. She is SOB with exertion and has no energy. No palpitations or racing. Checking pulse daily and it has been regular. Weight is down 6 lbs.    Current Outpatient Medications on File Prior to Visit  Medication Sig Dispense Refill  . carvedilol (COREG) 6.25 MG tablet Take 1 tablet (6.25 mg total) by mouth 2 (two) times daily. 60 tablet 3  . furosemide (LASIX) 40 MG tablet Take 40mg  (1 tab) by mouth twice daily for 3 days; then take 40mg  once daily. (Patient taking differently: Take 40-80 mg by mouth See admin instructions. TAKE 2 TABLETS (80 MG) BY MOUTH ONCE DAILY FOR 3 DAYS, THEN TAKE 1 TABLET (40 MG) DAILY THEREAFTER.) 36 tablet 0  . Multiple Vitamins-Minerals (PRESERVISION AREDS 2 PO) Take 1 capsule by mouth 2 (two) times daily.    . potassium chloride SA (K-DUR) 20 MEQ tablet Take 1 tablet (20 mEq total) by mouth daily. 90 tablet 3  . prednisoLONE acetate (PRED FORTE) 1 % ophthalmic suspension Place 1 drop into the left eye 4 (four) times daily.    . Rivaroxaban (XARELTO) 15 MG TABS tablet Take 1 tablet (15 mg total) by mouth daily with supper. 90 tablet 3  . rosuvastatin (CRESTOR) 10 MG tablet Take 10 mg by  mouth daily.     No current facility-administered medications on file prior to visit.     Allergies  Allergen Reactions  . Oxycodone Nausea And Vomiting    And the sweats    Past Medical History:  Diagnosis Date  . Chronic diastolic CHF (congestive heart failure) ([place]) 07/06/2015  . Dyspnea   . Heart murmur   . Hypercholesterolemia   . Hypertrophic obstructive cardiomyopathy (HOCM) ([place])   . LVH (left ventricular hypertrophy)    WITH NORMAL SYSTOLIC FUNCTION  . SOB (shortness of breath)     Past Surgical History:  Procedure Laterality Date  . CARDIAC CATHETERIZATION N/A 07/06/2015   Procedure: Right/Left Heart Cath  and Coronary Angiography;  Surgeon: Reno Clasby M Martinique, MD;  Location: [place] [facility];  Service: Cardiovascular;  Laterality: N/A;  . CARDIOVERSION N/A 10/23/2018   Procedure: CARDIOVERSION;  Surgeon: Donato Heinz, MD;  Location: [facility] ENDOSCOPY;  Service: Endoscopy;  Laterality: N/A;  . MITRAL VALVE REPLACEMENT N/A 11/15/2015   ligation of left atrial appendage, maze procedure at [place] Use  Smoking Status Former Smoker  . Quit date: 05/26/1965  . Years since quitting: 53.4  Smokeless Tobacco Never Used    Social History   Substance and Sexual Activity  Alcohol Use No    Family History  Problem Relation Age of Onset  . Heart failure Mother 85  . Heart attack Father 25    Review of Systems: As noted in HPI.   All other systems were reviewed and are negative.  Physical Exam: BP 139/63   Pulse (!) 58   Temp (!) 96.9 F (36.1 C)   Ht 5' 1.5" (1.562 m)   Wt 173 lb 12.8 oz (78.8 kg)   SpO2 93%   BMI 32.31 kg/m   Body mass index is 32.31 kg/m. GENERAL:  Well appearing obese WF in NAD HEENT:  PERRL, EOMI, sclera are clear. Oropharynx is clear. NECK:  No jugular venous distention, carotid upstroke brisk and symmetric, no bruits, no thyromegaly or adenopathy LUNGS:  Clear to auscultation bilaterally CHEST:  Unremarkable HEART:  RRR,  PMI not displaced or sustained,S1 and S2 within normal limits, no S3, no S4: no clicks, no rubs, gr 1/6 systolic murmur at the apex>> RUSB ABD:  Soft, nontender. BS +, no masses or bruits. No hepatomegaly, no splenomegaly EXT:  2 + pulses throughout, no edema, no cyanosis no clubbing SKIN:  Warm and dry.  No rashes NEURO:  Alert and oriented x 3. Cranial nerves II through XII intact. PSYCH:  Cognitively intact    LABORATORY DATA:   Lab Results  Component Value Date   WBC 8.6 10/23/2018   HGB 13.4 10/23/2018   HCT 40.5 10/23/2018   PLT 230 10/23/2018   GLUCOSE 102 (H) 10/17/2018   CHOL  196 02/24/2016   TRIG 115 02/24/2016   HDL 58 02/24/2016   LDLCALC 115 (H) 02/24/2016   ALT 14 11/19/2015   AST 19 11/19/2015   NA 142 10/17/2018   K 4.3 10/17/2018   CL 105 10/17/2018   CREATININE 1.33 (H) 10/17/2018   BUN 20 10/17/2018   CO2 21 10/17/2018   TSH 6.090 (H) 10/17/2018   INR 1.03 07/06/2015   Labs dated 03/11/16: creatinine 1.6 Dated 07/27/16: cholesterol 192, triglycerides 165, HDL 52, LDL 107. Creatinine 1.27. Potassium and ALT normal.  Dated 02/07/17: BUN 27, creatinine 1.25. Glucose 113. Other chemistries normal. Cholesterol 199, triglycerides 212, HDL  44, LDL 113.  Dated 02/08/18: cholesterol 161, triglycerides 209, HDL 44, LDL 75. LFTs normal.  Ecg today shows NSR rate 56. LBBB.   Echo 02/03/16: Study Conclusions  - Left ventricle: The cavity size was normal. There was moderate   concentric hypertrophy. Systolic function was normal. The   estimated ejection fraction was in the range of 60% to 65%. Wall   motion was normal; there were no regional wall motion   abnormalities. The study is not technically sufficient to allow   evaluation of LV diastolic function due to the presence of a   bioprosthetic mitral valve. - Aortic valve: Valve mobility was mildly restricted. There was   mild stenosis. There was mild regurgitation. - Mitral valve: Mildly calcified annulus. A 61mm St. Jude biocor   bioprosthesis was present and functioning well. - Left atrium: The atrium was severely dilated. - Right ventricle: The cavity size was normal. Wall thickness was   normal. Systolic function was normal. - Tricuspid valve: There was mild regurgitation. - Pulmonary arteries: Systolic pressure was mildly increased. PA   peak pressure: 42 mm Hg (S).  Impressions:  - Since the last echo 06/15/2015, a bioprosthetic mitral valve is   present and functioning well. Septal wall thickness is now 1.5 cm   compared with 1.8 cm pre-operatively.  Echo 04/19/17: Study Conclusions  -  Valve surgery. Mitral valve replacement with a [employer] Medical   porcine valve. 32mm Biocor bioprosthetic valve. - Left ventricle: The cavity size was normal. Wall thickness was   increased in a pattern of mild LVH. Systolic function was normal.   The estimated ejection fraction was in the range of 55% to 60%.   Wall motion was normal; there were no regional wall motion   abnormalities. Doppler parameters are consistent with a   reversible restrictive pattern, indicative of decreased left   ventricular diastolic compliance and/or increased left atrial   pressure (grade 3 diastolic dysfunction). - Aortic valve: There was trivial regurgitation. Mean gradient (S):   11 mm Hg. Peak gradient (S): 19 mm Hg. - Mitral valve: A bioprosthesis was present. The prosthesis had a   normal range of motion. Mean gradient (D): 5 mm Hg. - Left atrium: The atrium was mildly dilated. - Pulmonary arteries: Systolic pressure was moderately increased.   PA peak pressure: 56 mm Hg (S).  Echocardiogram 10/15/2018: 1. The left ventricle has severely reduced systolic function, with an ejection fraction of 25-30%. The cavity size was normal. Left ventricular diastolic function could not be evaluated. Elevated mean left atrial pressure Left ventricular diffuse  hypokinesis. 2. The right ventricle has moderately reduced systolic function. The cavity was normal. Right ventricular systolic pressure is mildly elevated with an estimated pressure of 41.8 mmHg. 3. Left atrial size was moderately dilated. 4. The tricuspid valve is grossly normal. Tricuspid valve regurgitation is moderate. 5. The aortic valve has an indeterminate number of cusps. Mild calcification of the aortic valve. Aortic valve regurgitation is trivial by color flow Doppler. No stenosis of the aortic valve. 6. The aorta is normal unless otherwise noted. 7. The inferior vena cava was dilated in size with >50% respiratory variability. 8. Septal  dyssynergy and global hypokinesis; overall severe LV dysfunction; trace AI; s/p MVR with no MR; moderate LAE; moderate RV dysfunction; moderate TR and mild pulmonary hypertension.  Assessment / Plan: 1. HOCM.  Now s/p septal myectomy on 11/05/15. Excellent result by follow up Echo. No significant residual LVOT gradient.   2.  Severe MR s/p MVR with #29 St. Jude Biocor valve. Routine SBE prophylaxis. Normal function by Echo.  3.  Acute systolic CHF. New decrease in LV function. EF 25-30%. Likely tachycardia mediated with new Atrial flutter with RVR. Now s/p DCCV but still class 2-3 symptoms. On Coreg and maximally tolerated dose. On lasix. Will add Entresto 24/26 mg bid. Will need to monitor renal function closely with her CKD. Check BMET on Friday. If able to tolerate will have pharmacy see in 2 weeks for drug titration. Plan to repeat Echo in 3 months. If EF remains low despite restoration of NSR and optimal medical therapy may need to consider for BiV ICD.   4. Paroxysmal Atrial fibrillation post op open heart surgery. S/p left sided MAZE. Now with new onset Atrial flutter. Off diltiazem and on Coreg due to low EF. DCCV on 10/23/18. Had been on amiodarone post op heart surgery. Patient questions if this was the cause of her optic nerve problem. Continue Xarelto long term.  If recurrent arrhythmia will need EP to see.   5. Hypercholesterolemia. On low dose Crestor. No history of CAD  6. CKD stage 2-3.   7. Elevated TSH. 6. Will repeat full TFTs.    I will follow up in 2 months

## 2018-10-28 ENCOUNTER — Ambulatory Visit (INDEPENDENT_AMBULATORY_CARE_PROVIDER_SITE_OTHER): Payer: Medicare Other | Admitting: Cardiology

## 2018-10-28 ENCOUNTER — Encounter: Payer: Self-pay | Admitting: Cardiology

## 2018-10-28 ENCOUNTER — Other Ambulatory Visit: Payer: Self-pay

## 2018-10-28 VITALS — BP 139/63 | HR 58 | Temp 96.9°F | Ht 61.5 in | Wt 173.8 lb

## 2018-10-28 DIAGNOSIS — I480 Paroxysmal atrial fibrillation: Secondary | ICD-10-CM | POA: Diagnosis not present

## 2018-10-28 DIAGNOSIS — R000 Tachycardia, unspecified: Secondary | ICD-10-CM | POA: Diagnosis not present

## 2018-10-28 DIAGNOSIS — I2510 Atherosclerotic heart disease of native coronary artery without angina pectoris: Secondary | ICD-10-CM | POA: Diagnosis not present

## 2018-10-28 DIAGNOSIS — Z23 Encounter for immunization: Secondary | ICD-10-CM

## 2018-10-28 DIAGNOSIS — E039 Hypothyroidism, unspecified: Secondary | ICD-10-CM

## 2018-10-28 NOTE — Patient Instructions (Signed)
Continue your current medication  We will add Entresto 24/26 mg twice a day for heart failure  Restrict your salt intake.   We will check a blood test this Friday for kidney function  Follow up with pharmacy in 2 weeks for medication titration  I will see you in 2 months

## 2018-10-30 ENCOUNTER — Other Ambulatory Visit: Payer: Self-pay

## 2018-10-30 MED ORDER — ENTRESTO 24-26 MG PO TABS: 1.0000 | ORAL_TABLET | Freq: Two times a day (BID) | ORAL | 6 refills | Status: DC

## 2018-11-01 DIAGNOSIS — I480 Paroxysmal atrial fibrillation: Secondary | ICD-10-CM | POA: Diagnosis not present

## 2018-11-01 DIAGNOSIS — E039 Hypothyroidism, unspecified: Secondary | ICD-10-CM | POA: Diagnosis not present

## 2018-11-02 LAB — TSH+T4F+T3FREE
Free T4: 1.26 ng/dL (ref 0.82–1.77)
T3, Free: 3.4 pg/mL (ref 2.0–4.4)
TSH: 4.52 u[IU]/mL — ABNORMAL HIGH (ref 0.450–4.500)

## 2018-11-02 LAB — BASIC METABOLIC PANEL
BUN/Creatinine Ratio: 16 (ref 12–28)
BUN: 23 mg/dL (ref 8–27)
CO2: 21 mmol/L (ref 20–29)
Calcium: 8.9 mg/dL (ref 8.7–10.3)
Chloride: 104 mmol/L (ref 96–106)
Creatinine, Ser: 1.43 mg/dL — ABNORMAL HIGH (ref 0.57–1.00)
GFR calc Af Amer: 39 mL/min/{1.73_m2} — ABNORMAL LOW (ref >59)
GFR calc non Af Amer: 34 mL/min/{1.73_m2} — ABNORMAL LOW (ref >59)
Glucose: 145 mg/dL — ABNORMAL HIGH (ref 65–99)
Potassium: 4.5 mmol/L (ref 3.5–5.2)
Sodium: 142 mmol/L (ref 134–144)

## 2018-11-18 ENCOUNTER — Other Ambulatory Visit: Payer: Self-pay | Admitting: Medical

## 2018-11-19 ENCOUNTER — Ambulatory Visit: Payer: Medicare Other | Admitting: Cardiology

## 2018-11-20 ENCOUNTER — Telehealth: Payer: Self-pay

## 2018-11-20 MED ORDER — FUROSEMIDE 40 MG PO TABS: 40.0000 mg | ORAL_TABLET | Freq: Every day | ORAL | 3 refills | Status: DC

## 2018-11-20 NOTE — Telephone Encounter (Signed)
90 day Lasix refill sent to pharmacy.

## 2018-11-28 DIAGNOSIS — H401123 Primary open-angle glaucoma, left eye, severe stage: Secondary | ICD-10-CM | POA: Diagnosis not present

## 2018-11-28 DIAGNOSIS — H35033 Hypertensive retinopathy, bilateral: Secondary | ICD-10-CM | POA: Diagnosis not present

## 2018-11-28 DIAGNOSIS — H47012 Ischemic optic neuropathy, left eye: Secondary | ICD-10-CM | POA: Diagnosis not present

## 2018-12-14 NOTE — Progress Notes (Signed)
[name] Date of Birth: 03/09/1934   History of Present Illness: Melinda Gonzales is seen for follow up of HOCM  She was evaluated in 2011 with a Myoview stress test which was normal. She had an Echo at that time that showed moderate LVH with EF 65-70%. Mild MR. She does have a history of murmur. She is not a smoker. In early 2016 she presented with increased dyspnea and an Echo and cardiac MRI were done. These with both consistent with HOCM. LVOT gradient of 112 mm Hg at rest. She was started on low dose lasix and Toprol XL with good response initially with improved dyspnea.  Seen last year with symptoms of increased dyspnea. No increased edema or orthopnea but more dyspnea with walking or lifting. No chest pain. No dizziness or palpitations. Energy level has decreased. Repeat Echo showed severe LVOT gradient and MR.  She underwent cardiac cath with findings of LVOT gradient and MR. No significant CAD. Pulmonary HTN. She was referred to the [facility] clinic and underwent surgery on 11/05/15. Prior to surgery she developed Afib and had a DCCV. Surgery  included a septal myectomy, MVR with a #29 mm St. Jude  Biocor valve, left sided Cryo MAZE, and ligation of the left atrial appendage. Her post op course was remarkable for paroxysmal atrial fibrillation and she was started on amiodarone and Xarelto.  Ecg showed a LBBB. Repeat Echo on 11/11/15 showed normal LV function with EF 60%. Mean MV gradient 8 mm Hg. Trace MR. Mild TR. There was a fixed LVOT gradient of 32 mm Hg peak and mean of 16 mm Hg. Repeat Echo 02/03/16 showed no LVOT gradient and normal MV prosthesis function.   She was seen in September  with symptoms of persistent tachycardia and SOB. HR in the 120s. Echo was obtained and showed significant decline in LV function with EF 25-30%. After review of Ecg tracings and reviewing with EP it appeared she was in Atrial flutter. BNP was 467. TSH was mildly elevated 6.0. She was started on Coreg.  Lasix was increased she was on Xarelto. She underwent  DCCV on 10/23/18.   Since her cardioversion she has felt much better. No dyspnea. No edema. Weight is down. HR has been in the 60s. No palpitations.    Current Outpatient Medications on File Prior to Visit  Medication Sig Dispense Refill  . carvedilol (COREG) 6.25 MG tablet Take 1 tablet (6.25 mg total) by mouth 2 (two) times daily. 60 tablet 3  . furosemide (LASIX) 40 MG tablet Take 1 tablet (40 mg total) by mouth daily. 90 tablet 3  . Multiple Vitamins-Minerals (PRESERVISION AREDS 2 PO) Take 1 capsule by mouth 2 (two) times daily.    . potassium chloride SA (K-DUR) 20 MEQ tablet Take 1 tablet (20 mEq total) by mouth daily. 90 tablet 3  . Rivaroxaban (XARELTO) 15 MG TABS tablet Take 1 tablet (15 mg total) by mouth daily with supper. 90 tablet 3  . rosuvastatin (CRESTOR) 10 MG tablet Take 10 mg by mouth daily.    . sacubitril-valsartan (ENTRESTO) 24-26 MG Take 1 tablet by mouth 2 (two) times daily. 60 tablet 6   No current facility-administered medications on file prior to visit.     Allergies  Allergen Reactions  . Oxycodone Nausea And Vomiting    And the sweats    Past Medical History:  Diagnosis Date  . Chronic diastolic CHF (congestive heart failure) ([place]) 07/06/2015  . Dyspnea   . Heart  murmur   . Hypercholesterolemia   . Hypertrophic obstructive cardiomyopathy (HOCM) ([place])   . LVH (left ventricular hypertrophy)    WITH NORMAL SYSTOLIC FUNCTION  . SOB (shortness of breath)     Past Surgical History:  Procedure Laterality Date  . CARDIAC CATHETERIZATION N/A 07/06/2015   Procedure: Right/Left Heart Cath and Coronary Angiography;  Surgeon: Pattricia Weiher M Martinique, MD;  Location: [place] [facility] LAB;  Service: Cardiovascular;  Laterality: N/A;  . CARDIOVERSION N/A 10/23/2018   Procedure: CARDIOVERSION;  Surgeon: Donato Heinz, MD;  Location: [facility] ENDOSCOPY;  Service: Endoscopy;  Laterality: N/A;  . MITRAL VALVE REPLACEMENT N/A  11/15/2015   ligation of left atrial appendage, maze procedure at [place] Use  Smoking Status Former Smoker  . Quit date: 05/26/1965  . Years since quitting: 53.5  Smokeless Tobacco Never Used    Social History   Substance and Sexual Activity  Alcohol Use No    Family History  Problem Relation Age of Onset  . Heart failure Mother 39  . Heart attack Father 2    Review of Systems: As noted in HPI.   All other systems were reviewed and are negative.  Physical Exam: BP 110/62   Pulse 63   Ht 5\' 1"  (1.549 m)   Wt 175 lb 6.4 oz (79.6 kg)   SpO2 97%   BMI 33.14 kg/m   Body mass index is 33.14 kg/m. GENERAL:  Well appearing obese WF in NAD HEENT:  PERRL, EOMI, sclera are clear. Oropharynx is clear. NECK:  No jugular venous distention, carotid upstroke brisk and symmetric, no bruits, no thyromegaly or adenopathy LUNGS:  Clear to auscultation bilaterally CHEST:  Unremarkable HEART:  RRR,  PMI not displaced or sustained,S1 and S2 within normal limits, no S3, no S4: no clicks, no rubs, gr 1/6 systolic murmur at the apex>> RUSB ABD:  Soft, nontender. BS +, no masses or bruits. No hepatomegaly, no splenomegaly EXT:  2 + pulses throughout, no edema, no cyanosis no clubbing SKIN:  Warm and dry.  No rashes NEURO:  Alert and oriented x 3. Cranial nerves II through XII intact. PSYCH:  Cognitively intact    LABORATORY DATA:   Lab Results  Component Value Date   WBC 8.6 10/23/2018   HGB 13.4 10/23/2018   HCT 40.5 10/23/2018   PLT 230 10/23/2018   GLUCOSE 145 (H) 11/01/2018   CHOL 196 02/24/2016   TRIG 115 02/24/2016   HDL 58 02/24/2016   LDLCALC 115 (H) 02/24/2016   ALT 14 11/19/2015   AST 19 11/19/2015   NA 142 11/01/2018   K 4.5 11/01/2018   CL 104 11/01/2018   CREATININE 1.43 (H) 11/01/2018   BUN 23 11/01/2018   CO2 21 11/01/2018   TSH 4.520 (H) 11/01/2018   INR 1.03 07/06/2015   Labs dated 03/11/16: creatinine 1.6 Dated  07/27/16: cholesterol 192, triglycerides 165, HDL 52, LDL 107. Creatinine 1.27. Potassium and ALT normal.  Dated 02/07/17: BUN 27, creatinine 1.25. Glucose 113. Other chemistries normal. Cholesterol 199, triglycerides 212, HDL 44, LDL 113.  Dated 02/08/18: cholesterol 161, triglycerides 209, HDL 44, LDL 75. LFTs normal.   Echo 02/03/16: Study Conclusions  - Left ventricle: The cavity size was normal. There was moderate   concentric hypertrophy. Systolic function was normal. The   estimated ejection fraction was in the range of 60% to 65%. Wall   motion was normal; there were no regional wall motion  abnormalities. The study is not technically sufficient to allow   evaluation of LV diastolic function due to the presence of a   bioprosthetic mitral valve. - Aortic valve: Valve mobility was mildly restricted. There was   mild stenosis. There was mild regurgitation. - Mitral valve: Mildly calcified annulus. A 20mm St. Jude biocor   bioprosthesis was present and functioning well. - Left atrium: The atrium was severely dilated. - Right ventricle: The cavity size was normal. Wall thickness was   normal. Systolic function was normal. - Tricuspid valve: There was mild regurgitation. - Pulmonary arteries: Systolic pressure was mildly increased. PA   peak pressure: 42 mm Hg (S).  Impressions:  - Since the last echo 06/15/2015, a bioprosthetic mitral valve is   present and functioning well. Septal wall thickness is now 1.5 cm   compared with 1.8 cm pre-operatively.  Echo 04/19/17: Study Conclusions  - Valve surgery. Mitral valve replacement with a [employer]. Jude Medical   porcine valve. 73mm Biocor bioprosthetic valve. - Left ventricle: The cavity size was normal. Wall thickness was   increased in a pattern of mild LVH. Systolic function was normal.   The estimated ejection fraction was in the range of 55% to 60%.   Wall motion was normal; there were no regional wall motion   abnormalities. Doppler  parameters are consistent with a   reversible restrictive pattern, indicative of decreased left   ventricular diastolic compliance and/or increased left atrial   pressure (grade 3 diastolic dysfunction). - Aortic valve: There was trivial regurgitation. Mean gradient (S):   11 mm Hg. Peak gradient (S): 19 mm Hg. - Mitral valve: A bioprosthesis was present. The prosthesis had a   normal range of motion. Mean gradient (D): 5 mm Hg. - Left atrium: The atrium was mildly dilated. - Pulmonary arteries: Systolic pressure was moderately increased.   PA peak pressure: 56 mm Hg (S).  Echocardiogram 10/15/2018: 1. The left ventricle has severely reduced systolic function, with an ejection fraction of 25-30%. The cavity size was normal. Left ventricular diastolic function could not be evaluated. Elevated mean left atrial pressure Left ventricular diffuse  hypokinesis. 2. The right ventricle has moderately reduced systolic function. The cavity was normal. Right ventricular systolic pressure is mildly elevated with an estimated pressure of 41.8 mmHg. 3. Left atrial size was moderately dilated. 4. The tricuspid valve is grossly normal. Tricuspid valve regurgitation is moderate. 5. The aortic valve has an indeterminate number of cusps. Mild calcification of the aortic valve. Aortic valve regurgitation is trivial by color flow Doppler. No stenosis of the aortic valve. 6. The aorta is normal unless otherwise noted. 7. The inferior vena cava was dilated in size with >50% respiratory variability. 8. Septal dyssynergy and global hypokinesis; overall severe LV dysfunction; trace AI; s/p MVR with no MR; moderate LAE; moderate RV dysfunction; moderate TR and mild pulmonary hypertension.  Assessment / Plan: 1. HOCM.  Now s/p septal myectomy on 11/05/15. Excellent result by follow up Echo. No significant residual LVOT gradient.   2. Severe MR s/p MVR with #29 St. Jude Biocor valve. Routine SBE prophylaxis.  Normal function by Echo.  3.  Acute systolic CHF. New decrease in LV function. EF 25-30%. Likely tachycardia mediated with new Atrial flutter with RVR. Now s/p DCCV with improved symptoms.  On Coreg and maximally tolerated dose. On lasix and  Entresto 24/26 mg bid.  Plan to repeat Echo in 1 month. If EF remains low despite restoration of NSR will need  to increase Entresto dose and consider for BiV ICD.   4. Paroxysmal Atrial fibrillation post op open heart surgery. S/p left sided MAZE. Seen in September with  new onset Atrial flutter. Off diltiazem and on Coreg due to low EF. DCCV on 10/23/18. Had been on amiodarone post op heart surgery. Patient questions if this was the cause of her optic nerve problem. Continue Xarelto long term.  If recurrent arrhythmia will need EP to see.   5. Hypercholesterolemia. On low dose Crestor. No history of CAD  6. CKD stage 2-3.   7. Elevated TSH. 6.  Repeat 4.5. normal T4 and T3 levels.    I will follow up in 3 months

## 2018-12-16 ENCOUNTER — Ambulatory Visit (INDEPENDENT_AMBULATORY_CARE_PROVIDER_SITE_OTHER): Payer: Medicare Other | Admitting: Cardiology

## 2018-12-16 ENCOUNTER — Encounter: Payer: Self-pay | Admitting: Cardiology

## 2018-12-16 ENCOUNTER — Other Ambulatory Visit: Payer: Self-pay

## 2018-12-16 VITALS — BP 110/62 | HR 63 | Ht 61.0 in | Wt 175.4 lb

## 2018-12-16 DIAGNOSIS — Z952 Presence of prosthetic heart valve: Secondary | ICD-10-CM | POA: Diagnosis not present

## 2018-12-16 DIAGNOSIS — I2510 Atherosclerotic heart disease of native coronary artery without angina pectoris: Secondary | ICD-10-CM | POA: Diagnosis not present

## 2018-12-16 DIAGNOSIS — I480 Paroxysmal atrial fibrillation: Secondary | ICD-10-CM

## 2018-12-16 DIAGNOSIS — I5041 Acute combined systolic (congestive) and diastolic (congestive) heart failure: Secondary | ICD-10-CM | POA: Diagnosis not present

## 2018-12-16 DIAGNOSIS — I484 Atypical atrial flutter: Secondary | ICD-10-CM

## 2018-12-16 DIAGNOSIS — I421 Obstructive hypertrophic cardiomyopathy: Secondary | ICD-10-CM

## 2018-12-16 DIAGNOSIS — I340 Nonrheumatic mitral (valve) insufficiency: Secondary | ICD-10-CM

## 2018-12-16 DIAGNOSIS — I5032 Chronic diastolic (congestive) heart failure: Secondary | ICD-10-CM

## 2018-12-16 NOTE — Patient Instructions (Signed)
Medication Instructions:  Continue same medications  Lab Work: None ordered  Testing/Procedures: Schedule Echo in 1 month  Follow-Up: At [facility], you and your health needs are our priority.  As part of our continuing mission to provide you with exceptional heart care, we have created designated Provider Care Teams.  These Care Teams include your primary Cardiologist (physician) and Advanced Practice Providers (APPs -  Physician Assistants and Nurse Practitioners) who all work together to provide you with the care you need, when you need it.  Your next appointment:  Friday 03/21/19 at 1:40 pm   The format for your next appointment:  Office   Provider:  Dr.Jordan

## 2019-01-09 DIAGNOSIS — H401123 Primary open-angle glaucoma, left eye, severe stage: Secondary | ICD-10-CM | POA: Diagnosis not present

## 2019-01-09 DIAGNOSIS — H353132 Nonexudative age-related macular degeneration, bilateral, intermediate dry stage: Secondary | ICD-10-CM | POA: Diagnosis not present

## 2019-01-09 DIAGNOSIS — H4720 Unspecified optic atrophy: Secondary | ICD-10-CM | POA: Diagnosis not present

## 2019-01-09 DIAGNOSIS — H47011 Ischemic optic neuropathy, right eye: Secondary | ICD-10-CM | POA: Diagnosis not present

## 2019-01-09 DIAGNOSIS — H47012 Ischemic optic neuropathy, left eye: Secondary | ICD-10-CM | POA: Diagnosis not present

## 2019-01-09 DIAGNOSIS — H2511 Age-related nuclear cataract, right eye: Secondary | ICD-10-CM | POA: Diagnosis not present

## 2019-01-09 DIAGNOSIS — H35033 Hypertensive retinopathy, bilateral: Secondary | ICD-10-CM | POA: Diagnosis not present

## 2019-01-09 DIAGNOSIS — Z961 Presence of intraocular lens: Secondary | ICD-10-CM | POA: Diagnosis not present

## 2019-01-13 ENCOUNTER — Other Ambulatory Visit: Payer: Self-pay

## 2019-01-13 ENCOUNTER — Ambulatory Visit (HOSPITAL_COMMUNITY): Payer: Medicare Other | Attending: Cardiovascular Disease

## 2019-01-13 DIAGNOSIS — I5032 Chronic diastolic (congestive) heart failure: Secondary | ICD-10-CM | POA: Diagnosis not present

## 2019-01-13 DIAGNOSIS — I340 Nonrheumatic mitral (valve) insufficiency: Secondary | ICD-10-CM | POA: Diagnosis not present

## 2019-01-15 ENCOUNTER — Telehealth: Payer: Self-pay | Admitting: Cardiology

## 2019-01-15 NOTE — Telephone Encounter (Signed)
    Please return call to patient with echo results

## 2019-01-15 NOTE — Telephone Encounter (Signed)
Pt called and verbalized understanding of her Echo results.

## 2019-02-03 ENCOUNTER — Telehealth: Payer: Self-pay | Admitting: Cardiology

## 2019-02-03 NOTE — Telephone Encounter (Signed)
New Message:   Pt wants to know if they will be able to the COVID Vaccine here?

## 2019-02-05 NOTE — Telephone Encounter (Signed)
Spoke to patient 02/03/19 advised to call PCP or local health department about covid vaccine.Advised our office will not be giving covid vaccines.

## 2019-02-21 ENCOUNTER — Other Ambulatory Visit: Payer: Self-pay | Admitting: Medical

## 2019-02-24 ENCOUNTER — Ambulatory Visit: Payer: Medicare Other | Attending: Internal Medicine

## 2019-02-24 DIAGNOSIS — Z23 Encounter for immunization: Secondary | ICD-10-CM | POA: Insufficient documentation

## 2019-02-24 NOTE — Progress Notes (Signed)
   Covid-19 Vaccination Clinic  Name:  Melinda Gonzales    MRN: [id number] DOB: 29-Jul-1934  02/24/2019  Melinda Gonzales was observed post Covid-19 immunization for 15 minutes without incidence. She was provided with Vaccine Information Sheet and instruction to access the V-Safe system.   Melinda Gonzales was instructed to call 911 with any severe reactions post vaccine: Marland Kitchen Difficulty breathing  . Swelling of your face and throat  . A fast heartbeat  . A bad rash all over your body  . Dizziness and weakness    Immunizations Administered    Name Date Dose VIS Date Route   Pfizer COVID-19 Vaccine 02/24/2019  9:55 AM 0.3 mL 01/10/2019 Intramuscular   Manufacturer: [place]   Lot: [id number]   [place]: [id number]

## 2019-03-17 ENCOUNTER — Ambulatory Visit: Payer: Medicare Other | Attending: Internal Medicine

## 2019-03-17 DIAGNOSIS — Z23 Encounter for immunization: Secondary | ICD-10-CM

## 2019-03-17 NOTE — Progress Notes (Signed)
   Covid-19 Vaccination Clinic  Name:  Melinda Gonzales    MRN: [id number] DOB: 1935-01-27  03/17/2019  [name] was observed post Covid-19 immunization for 15 minutes without incidence. She was provided with Vaccine Information Sheet and instruction to access the V-Safe system.   [name] was instructed to call 911 with any severe reactions post vaccine: Marland Kitchen Difficulty breathing  . Swelling of your face and throat  . A fast heartbeat  . A bad rash all over your body  . Dizziness and weakness    Immunizations Administered    Name Date Dose VIS Date Route   Pfizer COVID-19 Vaccine 03/17/2019  9:28 AM 0.3 mL 01/10/2019 Intramuscular   Manufacturer: [place]   Lot: [id number]   [place]: [id number]

## 2019-03-20 NOTE — Progress Notes (Signed)
[name] Date of Birth: 04/12/1934   History of Present Illness: [name] is seen for follow up of HOCM  She was evaluated in 2011 with a Myoview stress test which was normal. She had an Echo at that time that showed moderate LVH with EF 65-70%. Mild MR. She does have a history of murmur. She is not a smoker. In early 2016 she presented with increased dyspnea and an Echo and cardiac MRI were done. These with both consistent with HOCM. LVOT gradient of 112 mm Hg at rest. She was started on low dose lasix and Toprol XL with good response initially with improved dyspnea.  Seen last year with symptoms of increased dyspnea. No increased edema or orthopnea but more dyspnea with walking or lifting. No chest pain. No dizziness or palpitations. Energy level has decreased. Repeat Echo showed severe LVOT gradient and MR.  She underwent cardiac cath with findings of LVOT gradient and MR. No significant CAD. Pulmonary HTN. She was referred to the [facility] clinic and underwent surgery on 11/05/15. Prior to surgery she developed Afib and had a DCCV. Surgery  included a septal myectomy, MVR with a #29 mm St. Jude  Biocor valve, left sided Cryo MAZE, and ligation of the left atrial appendage. Her post op course was remarkable for paroxysmal atrial fibrillation and she was started on amiodarone and Xarelto.  Ecg showed a LBBB. Repeat Echo on 11/11/15 showed normal LV function with EF 60%. Mean MV gradient 8 mm Hg. Trace MR. Mild TR. There was a fixed LVOT gradient of 32 mm Hg peak and mean of 16 mm Hg. Repeat Echo 02/03/16 showed no LVOT gradient and normal MV prosthesis function.   She was seen in September 2020 with symptoms of persistent tachycardia and SOB. HR in the 120s. Echo was obtained and showed significant decline in LV function with EF 25-30%. After review of Ecg tracings and reviewing with EP it appeared she was in Atrial flutter. BNP was 467. TSH was mildly elevated 6.0. She was started on  Coreg. Lasix was increased she was on Xarelto. She underwent  DCCV on 10/23/18. Subsequent Echo in December showed EF had returned to normal.   Since her cardioversion she has felt much better. No dyspnea. No edema.  HR has been in the 60s. No palpitations. She has gained some weight this winter.    Current Outpatient Medications on File Prior to Visit  Medication Sig Dispense Refill  . carvedilol (COREG) 6.25 MG tablet TAKE 1 TABLET (6.25 MG TOTAL) BY MOUTH 2 (TWO) TIMES DAILY. 180 tablet 2  . furosemide (LASIX) 40 MG tablet Take 1 tablet (40 mg total) by mouth daily. 90 tablet 3  . potassium chloride SA (K-DUR) 20 MEQ tablet Take 1 tablet (20 mEq total) by mouth daily. 90 tablet 3  . Rivaroxaban (XARELTO) 15 MG TABS tablet Take 1 tablet (15 mg total) by mouth daily with supper. 90 tablet 3  . rosuvastatin (CRESTOR) 10 MG tablet Take 10 mg by mouth daily.    . sacubitril-valsartan (ENTRESTO) 24-26 MG Take 1 tablet by mouth 2 (two) times daily. 60 tablet 6   No current facility-administered medications on file prior to visit.    Allergies  Allergen Reactions  . Oxycodone Nausea And Vomiting    And the sweats    Past Medical History:  Diagnosis Date  . Chronic diastolic CHF (congestive heart failure) ([place]) 07/06/2015  . Dyspnea   . Heart murmur   . Hypercholesterolemia   .  Hypertrophic obstructive cardiomyopathy (HOCM) ([place])   . LVH (left ventricular hypertrophy)    WITH NORMAL SYSTOLIC FUNCTION  . SOB (shortness of breath)     Past Surgical History:  Procedure Laterality Date  . CARDIAC CATHETERIZATION N/A 07/06/2015   Procedure: Right/Left Heart Cath and Coronary Angiography;  Surgeon: Romulo Okray M [place], MD;  Location: [place] CV LAB;  Service: Cardiovascular;  Laterality: N/A;  . CARDIOVERSION N/A 10/23/2018   Procedure: CARDIOVERSION;  Surgeon: Donato Heinz, MD;  Location: [facility] ENDOSCOPY;  Service: Endoscopy;  Laterality: N/A;  . MITRAL VALVE REPLACEMENT N/A 11/15/2015    ligation of left atrial appendage, maze procedure at [place] Use  Smoking Status Former Smoker  . Quit date: 05/26/1965  . Years since quitting: 53.8  Smokeless Tobacco Never Used    Social History   Substance and Sexual Activity  Alcohol Use No    Family History  Problem Relation Age of Onset  . Heart failure Mother 3  . Heart attack Father 35    Review of Systems: As noted in HPI.   All other systems were reviewed and are negative.  Physical Exam: BP 134/62 (BP Location: Right Arm, Patient Position: Sitting, Cuff Size: Large)   Pulse 60   Temp (!) 96.8 F (36 C)   Ht 5\' 2"  (1.575 m)   Wt 182 lb (82.6 kg)   BMI 33.29 kg/m   Body mass index is 33.29 kg/m. GENERAL:  Well appearing obese WF in NAD HEENT:  PERRL, EOMI, sclera are clear. Oropharynx is clear. NECK:  No jugular venous distention, carotid upstroke brisk and symmetric, no bruits, no thyromegaly or adenopathy LUNGS:  Clear to auscultation bilaterally CHEST:  Unremarkable HEART:  RRR,  PMI not displaced or sustained,S1 and S2 within normal limits, no S3, no S4: no clicks, no rubs, gr 1/6 systolic murmur at the apex>> RUSB ABD:  Soft, nontender. BS +, no masses or bruits. No hepatomegaly, no splenomegaly EXT:  2 + pulses throughout, no edema, no cyanosis no clubbing SKIN:  Warm and dry.  No rashes NEURO:  Alert and oriented x 3. Cranial nerves II through XII intact. PSYCH:  Cognitively intact    LABORATORY DATA:   Lab Results  Component Value Date   WBC 8.6 10/23/2018   HGB 13.4 10/23/2018   HCT 40.5 10/23/2018   PLT 230 10/23/2018   GLUCOSE 145 (H) 11/01/2018   CHOL 196 02/24/2016   TRIG 115 02/24/2016   HDL 58 02/24/2016   LDLCALC 115 (H) 02/24/2016   ALT 14 11/19/2015   AST 19 11/19/2015   NA 142 11/01/2018   K 4.5 11/01/2018   CL 104 11/01/2018   CREATININE 1.43 (H) 11/01/2018   BUN 23 11/01/2018   CO2 21 11/01/2018   TSH 4.520 (H) 11/01/2018    INR 1.03 07/06/2015   Labs dated 03/11/16: creatinine 1.6 Dated 07/27/16: cholesterol 192, triglycerides 165, HDL 52, LDL 107. Creatinine 1.27. Potassium and ALT normal.  Dated 02/07/17: BUN 27, creatinine 1.25. Glucose 113. Other chemistries normal. Cholesterol 199, triglycerides 212, HDL 44, LDL 113.  Dated 02/08/18: cholesterol 161, triglycerides 209, HDL 44, LDL 75. LFTs normal.   Echo 02/03/16: Study Conclusions  - Left ventricle: The cavity size was normal. There was moderate   concentric hypertrophy. Systolic function was normal. The   estimated ejection fraction was in the range of 60% to 65%. Wall   motion was normal; there were no regional  wall motion   abnormalities. The study is not technically sufficient to allow   evaluation of LV diastolic function due to the presence of a   bioprosthetic mitral valve. - Aortic valve: Valve mobility was mildly restricted. There was   mild stenosis. There was mild regurgitation. - Mitral valve: Mildly calcified annulus. A 23mm St. Jude biocor   bioprosthesis was present and functioning well. - Left atrium: The atrium was severely dilated. - Right ventricle: The cavity size was normal. Wall thickness was   normal. Systolic function was normal. - Tricuspid valve: There was mild regurgitation. - Pulmonary arteries: Systolic pressure was mildly increased. PA   peak pressure: 42 mm Hg (S).  Impressions:  - Since the last echo 06/15/2015, a bioprosthetic mitral valve is   present and functioning well. Septal wall thickness is now 1.5 cm   compared with 1.8 cm pre-operatively.  Echo 04/19/17: Study Conclusions  - Valve surgery. Mitral valve replacement with a [employer]   porcine valve. 63mm Biocor bioprosthetic valve. - Left ventricle: The cavity size was normal. Wall thickness was   increased in a pattern of mild LVH. Systolic function was normal.   The estimated ejection fraction was in the range of 55% to 60%.   Wall motion was  normal; there were no regional wall motion   abnormalities. Doppler parameters are consistent with a   reversible restrictive pattern, indicative of decreased left   ventricular diastolic compliance and/or increased left atrial   pressure (grade 3 diastolic dysfunction). - Aortic valve: There was trivial regurgitation. Mean gradient (S):   11 mm Hg. Peak gradient (S): 19 mm Hg. - Mitral valve: A bioprosthesis was present. The prosthesis had a   normal range of motion. Mean gradient (D): 5 mm Hg. - Left atrium: The atrium was mildly dilated. - Pulmonary arteries: Systolic pressure was moderately increased.   PA peak pressure: 56 mm Hg (S).  Echocardiogram 10/15/2018: 1. The left ventricle has severely reduced systolic function, with an ejection fraction of 25-30%. The cavity size was normal. Left ventricular diastolic function could not be evaluated. Elevated mean left atrial pressure Left ventricular diffuse  hypokinesis. 2. The right ventricle has moderately reduced systolic function. The cavity was normal. Right ventricular systolic pressure is mildly elevated with an estimated pressure of 41.8 mmHg. 3. Left atrial size was moderately dilated. 4. The tricuspid valve is grossly normal. Tricuspid valve regurgitation is moderate. 5. The aortic valve has an indeterminate number of cusps. Mild calcification of the aortic valve. Aortic valve regurgitation is trivial by color flow Doppler. No stenosis of the aortic valve. 6. The aorta is normal unless otherwise noted. 7. The inferior vena cava was dilated in size with >50% respiratory variability. 8. Septal dyssynergy and global hypokinesis; overall severe LV dysfunction; trace AI; s/p MVR with no MR; moderate LAE; moderate RV dysfunction; moderate TR and mild pulmonary hypertension.  Echo: 01/13/19: IMPRESSIONS    1. Left ventricular ejection fraction, by visual estimation, is 60 to  65%. The left ventricle has normal function. There  is severely increased  left ventricular hypertrophy.  2. Left ventricular diastolic function could not be evaluated.  3. The left ventricle has no regional wall motion abnormalities.  4. Global right ventricle has normal systolic function.The right  ventricular size is normal. No increase in right ventricular wall  thickness.  5. Left atrial size was normal.  6. Right atrial size was normal.  7. The mitral valve has been repaired/replaced. No  evidence of mitral  valve regurgitation.  8. The tricuspid valve is grossly normal. Tricuspid valve regurgitation  is trivial.  9. The aortic valve is abnormal. Aortic valve regurgitation is mild. Mild  aortic valve sclerosis without stenosis.  10. Pulmonic regurgitation is mild.  11. The pulmonic valve was normal in structure. Pulmonic valve  regurgitation is mild.  12. The atrial septum is grossly normal.   Assessment / Plan: 1. HOCM.  Now s/p septal myectomy on 11/05/15. Excellent result by follow up Echo. No significant residual LVOT gradient.   2. Severe MR s/p MVR with #29 St. Jude Biocor valve. Routine SBE prophylaxis. Normal function by Echo.  3.  Acute systolic CHF. New decrease in LV function this fall with atrial flutter. EF 25-30%. Likely tachycardia mediated with new Atrial flutter with RVR. Now s/p DCCV with improved symptoms.  EF has normalized based on Echo in December. On Coreg, lasix and  Entresto 24/26 mg bid.    4. Paroxysmal Atrial fibrillation post op open heart surgery. S/p left sided MAZE. Seen in September with  new onset Atrial flutter. Off diltiazem and on Coreg due to low EF. DCCV on 10/23/18. Had been on amiodarone post op heart surgery. Patient questions if this was the cause of her optic nerve problem. Continue Xarelto long term.  If recurrent arrhythmia will need EP to see.   5. Hypercholesterolemia. On low dose Crestor. No history of CAD  6. CKD stage 2-3.   7. Elevated TSH. 6.  Repeat 4.5. normal T4 and  T3 levels. Last TSH down to 4.5.    I will follow up in 6 months

## 2019-03-21 ENCOUNTER — Other Ambulatory Visit: Payer: Self-pay

## 2019-03-21 ENCOUNTER — Ambulatory Visit (INDEPENDENT_AMBULATORY_CARE_PROVIDER_SITE_OTHER): Payer: Medicare Other | Admitting: Cardiology

## 2019-03-21 VITALS — BP 134/62 | HR 60 | Temp 96.8°F | Ht 62.0 in | Wt 182.0 lb

## 2019-03-21 DIAGNOSIS — Z952 Presence of prosthetic heart valve: Secondary | ICD-10-CM | POA: Diagnosis not present

## 2019-03-21 DIAGNOSIS — I484 Atypical atrial flutter: Secondary | ICD-10-CM | POA: Diagnosis not present

## 2019-03-21 DIAGNOSIS — I421 Obstructive hypertrophic cardiomyopathy: Secondary | ICD-10-CM

## 2019-05-26 ENCOUNTER — Other Ambulatory Visit: Payer: Self-pay

## 2019-05-26 ENCOUNTER — Encounter (INDEPENDENT_AMBULATORY_CARE_PROVIDER_SITE_OTHER): Payer: Medicare Other | Admitting: Ophthalmology

## 2019-05-26 DIAGNOSIS — H43813 Vitreous degeneration, bilateral: Secondary | ICD-10-CM

## 2019-05-26 DIAGNOSIS — I10 Essential (primary) hypertension: Secondary | ICD-10-CM

## 2019-05-26 DIAGNOSIS — H353132 Nonexudative age-related macular degeneration, bilateral, intermediate dry stage: Secondary | ICD-10-CM | POA: Diagnosis not present

## 2019-05-26 DIAGNOSIS — H35033 Hypertensive retinopathy, bilateral: Secondary | ICD-10-CM

## 2019-06-10 ENCOUNTER — Telehealth: Payer: Self-pay | Admitting: Cardiology

## 2019-06-10 ENCOUNTER — Other Ambulatory Visit: Payer: Self-pay | Admitting: Cardiology

## 2019-06-10 MED ORDER — ENTRESTO 24-26 MG PO TABS: 1.0000 | ORAL_TABLET | Freq: Two times a day (BID) | ORAL | 2 refills | Status: DC

## 2019-06-10 NOTE — Telephone Encounter (Signed)
Per Sharyn Lull at [facility] this is not for an [place] PA, it is for a refill. I gave her a verbal RX for Entresto 24-26 mg take 1 tablet PO BID #180 with 2 refills and also e-scribed it to them as well. Sharyn Lull was very appreciative of my call and asst with this matter as the pt insisted that she call us for this refill and not just fax [place] a request.

## 2019-06-10 NOTE — Telephone Encounter (Signed)
Message sent to prior authorization nurse. 

## 2019-06-10 NOTE — Telephone Encounter (Signed)
Pt c/o medication issue:  1. Name of Medication: sacubitril-valsartan (ENTRESTO) 24-26 MG  2. How are you currently taking this medication (dosage and times per day)? As directed  3. Are you having a reaction (difficulty breathing--STAT)? No  4. What is your medication issue? Sharyn Lull from [facility] is calling because she states that the patient is very adamant on getting this medication authorized so she can get her refill. She states that the patient only has one tablet left. Sharyn Lull was calling for patient to see if there could be a speed up on things.

## 2019-08-27 DIAGNOSIS — M1712 Unilateral primary osteoarthritis, left knee: Secondary | ICD-10-CM | POA: Diagnosis not present

## 2019-08-27 DIAGNOSIS — M1711 Unilateral primary osteoarthritis, right knee: Secondary | ICD-10-CM | POA: Diagnosis not present

## 2019-09-04 ENCOUNTER — Other Ambulatory Visit: Payer: Self-pay | Admitting: Cardiology

## 2019-09-21 NOTE — Progress Notes (Deleted)
Melinda Gonzales Date of Birth: 1934-08-23   History of Present Illness: [name] is seen for follow up of HOCM  She was evaluated in 2011 with a Myoview stress test which was normal. She had an Echo at that time that showed moderate LVH with EF 65-70%. Mild MR. She does have a history of murmur. She is not a smoker. In early 2016 she presented with increased dyspnea and an Echo and cardiac MRI were done. These with both consistent with HOCM. LVOT gradient of 112 mm Hg at rest. She was started on low dose lasix and Toprol XL with good response initially with improved dyspnea.  Seen last year with symptoms of increased dyspnea. No increased edema or orthopnea but more dyspnea with walking or lifting. No chest pain. No dizziness or palpitations. Energy level has decreased. Repeat Echo showed severe LVOT gradient and MR.  She underwent cardiac cath with findings of LVOT gradient and MR. No significant CAD. Pulmonary HTN. She was referred to the [facility] clinic and underwent surgery on 11/05/15. Prior to surgery she developed Afib and had a DCCV. Surgery  included a septal myectomy, MVR with a #29 mm [employer]. Jude  Biocor valve, left sided Cryo MAZE, and ligation of the left atrial appendage. Her post op course was remarkable for paroxysmal atrial fibrillation and she was started on amiodarone and Xarelto.  Ecg showed a LBBB. Repeat Echo on 11/11/15 showed normal LV function with EF 60%. Mean MV gradient 8 mm Hg. Trace MR. Mild TR. There was a fixed LVOT gradient of 32 mm Hg peak and mean of 16 mm Hg. Repeat Echo 02/03/16 showed no LVOT gradient and normal MV prosthesis function.   She was seen in September 2020 with symptoms of persistent tachycardia and SOB. HR in the 120s. Echo was obtained and showed significant decline in LV function with EF 25-30%. After review of Ecg tracings and reviewing with EP it appeared she was in Atrial flutter. BNP was 467. TSH was mildly elevated 6.0. She was started on  Coreg. Lasix was increased she was on Xarelto. She underwent  DCCV on 10/23/18. Subsequent Echo in December showed EF had returned to normal.   Since her cardioversion she has felt much better. No dyspnea. No edema.  HR has been in the 60s. No palpitations. She has gained some weight this winter.    Current Outpatient Medications on File Prior to Visit  Medication Sig Dispense Refill  . carvedilol (COREG) 6.25 MG tablet TAKE 1 TABLET (6.25 MG TOTAL) BY MOUTH 2 (TWO) TIMES DAILY. 180 tablet 2  . furosemide (LASIX) 40 MG tablet Take 1 tablet (40 mg total) by mouth daily. 90 tablet 3  . potassium chloride SA (K-DUR) 20 MEQ tablet Take 1 tablet (20 mEq total) by mouth daily. 90 tablet 3  . rosuvastatin (CRESTOR) 10 MG tablet Take 10 mg by mouth daily.    . sacubitril-valsartan (ENTRESTO) 24-26 MG Take 1 tablet by mouth 2 (two) times daily. 180 tablet 2  . XARELTO 15 MG TABS tablet TAKE 1 TABLET (15 MG TOTAL) BY MOUTH DAILY WITH SUPPER. 30 tablet 3   No current facility-administered medications on file prior to visit.    Allergies  Allergen Reactions  . Oxycodone Nausea And Vomiting    And the sweats    Past Medical History:  Diagnosis Date  . Chronic diastolic CHF (congestive heart failure) ([place]) 07/06/2015  . Dyspnea   . Heart murmur   . Hypercholesterolemia   .  Hypertrophic obstructive cardiomyopathy (HOCM) ([place])   . LVH (left ventricular hypertrophy)    WITH NORMAL SYSTOLIC FUNCTION  . SOB (shortness of breath)     Past Surgical History:  Procedure Laterality Date  . CARDIAC CATHETERIZATION N/A 07/06/2015   Procedure: Right/Left Heart Cath and Coronary Angiography;  Surgeon: Leetta Hendriks M [place], MD;  Location: [place] CV LAB;  Service: Cardiovascular;  Laterality: N/A;  . CARDIOVERSION N/A 10/23/2018   Procedure: CARDIOVERSION;  Surgeon: Donato Heinz, MD;  Location: [facility] ENDOSCOPY;  Service: Endoscopy;  Laterality: N/A;  . MITRAL VALVE REPLACEMENT N/A 11/15/2015   ligation  of left atrial appendage, maze procedure at [place] Use  Smoking Status Former Smoker  . Quit date: 05/26/1965  . Years since quitting: 54.3  Smokeless Tobacco Never Used    Social History   Substance and Sexual Activity  Alcohol Use No    Family History  Problem Relation Age of Onset  . Heart failure Mother 88  . Heart attack Father 36    Review of Systems: As noted in HPI.   All other systems were reviewed and are negative.  Physical Exam: There were no vitals taken for this visit.  There is no height or weight on file to calculate BMI. GENERAL:  Well appearing obese WF in NAD HEENT:  PERRL, EOMI, sclera are clear. Oropharynx is clear. NECK:  No jugular venous distention, carotid upstroke brisk and symmetric, no bruits, no thyromegaly or adenopathy LUNGS:  Clear to auscultation bilaterally CHEST:  Unremarkable HEART:  RRR,  PMI not displaced or sustained,S1 and S2 within normal limits, no S3, no S4: no clicks, no rubs, gr 1/6 systolic murmur at the apex>> RUSB ABD:  Soft, nontender. BS +, no masses or bruits. No hepatomegaly, no splenomegaly EXT:  2 + pulses throughout, no edema, no cyanosis no clubbing SKIN:  Warm and dry.  No rashes NEURO:  Alert and oriented x 3. Cranial nerves II through XII intact. PSYCH:  Cognitively intact    LABORATORY DATA:   Lab Results  Component Value Date   WBC 8.6 10/23/2018   HGB 13.4 10/23/2018   HCT 40.5 10/23/2018   PLT 230 10/23/2018   GLUCOSE 145 (H) 11/01/2018   CHOL 196 02/24/2016   TRIG 115 02/24/2016   HDL 58 02/24/2016   LDLCALC 115 (H) 02/24/2016   ALT 14 11/19/2015   AST 19 11/19/2015   NA 142 11/01/2018   K 4.5 11/01/2018   CL 104 11/01/2018   CREATININE 1.43 (H) 11/01/2018   BUN 23 11/01/2018   CO2 21 11/01/2018   TSH 4.520 (H) 11/01/2018   INR 1.03 07/06/2015   Labs dated 03/11/16: creatinine 1.6 Dated 07/27/16: cholesterol 192, triglycerides 165, HDL 52, LDL 107.  Creatinine 1.27. Potassium and ALT normal.  Dated 02/07/17: BUN 27, creatinine 1.25. Glucose 113. Other chemistries normal. Cholesterol 199, triglycerides 212, HDL 44, LDL 113.  Dated 02/08/18: cholesterol 161, triglycerides 209, HDL 44, LDL 75. LFTs normal.   Echo 02/03/16: Study Conclusions  - Left ventricle: The cavity size was normal. There was moderate   concentric hypertrophy. Systolic function was normal. The   estimated ejection fraction was in the range of 60% to 65%. Wall   motion was normal; there were no regional wall motion   abnormalities. The study is not technically sufficient to allow   evaluation of LV diastolic function due to the presence of a   bioprosthetic mitral valve. -  Aortic valve: Valve mobility was mildly restricted. There was   mild stenosis. There was mild regurgitation. - Mitral valve: Mildly calcified annulus. A 44mm St. Jude biocor   bioprosthesis was present and functioning well. - Left atrium: The atrium was severely dilated. - Right ventricle: The cavity size was normal. Wall thickness was   normal. Systolic function was normal. - Tricuspid valve: There was mild regurgitation. - Pulmonary arteries: Systolic pressure was mildly increased. PA   peak pressure: 42 mm Hg (S).  Impressions:  - Since the last echo 06/15/2015, a bioprosthetic mitral valve is   present and functioning well. Septal wall thickness is now 1.5 cm   compared with 1.8 cm pre-operatively.  Echo 04/19/17: Study Conclusions  - Valve surgery. Mitral valve replacement with a [employer]   porcine valve. 107mm Biocor bioprosthetic valve. - Left ventricle: The cavity size was normal. Wall thickness was   increased in a pattern of mild LVH. Systolic function was normal.   The estimated ejection fraction was in the range of 55% to 60%.   Wall motion was normal; there were no regional wall motion   abnormalities. Doppler parameters are consistent with a   reversible restrictive  pattern, indicative of decreased left   ventricular diastolic compliance and/or increased left atrial   pressure (grade 3 diastolic dysfunction). - Aortic valve: There was trivial regurgitation. Mean gradient (S):   11 mm Hg. Peak gradient (S): 19 mm Hg. - Mitral valve: A bioprosthesis was present. The prosthesis had a   normal range of motion. Mean gradient (D): 5 mm Hg. - Left atrium: The atrium was mildly dilated. - Pulmonary arteries: Systolic pressure was moderately increased.   PA peak pressure: 56 mm Hg (S).  Echocardiogram 10/15/2018: 1. The left ventricle has severely reduced systolic function, with an ejection fraction of 25-30%. The cavity size was normal. Left ventricular diastolic function could not be evaluated. Elevated mean left atrial pressure Left ventricular diffuse  hypokinesis. 2. The right ventricle has moderately reduced systolic function. The cavity was normal. Right ventricular systolic pressure is mildly elevated with an estimated pressure of 41.8 mmHg. 3. Left atrial size was moderately dilated. 4. The tricuspid valve is grossly normal. Tricuspid valve regurgitation is moderate. 5. The aortic valve has an indeterminate number of cusps. Mild calcification of the aortic valve. Aortic valve regurgitation is trivial by color flow Doppler. No stenosis of the aortic valve. 6. The aorta is normal unless otherwise noted. 7. The inferior vena cava was dilated in size with >50% respiratory variability. 8. Septal dyssynergy and global hypokinesis; overall severe LV dysfunction; trace AI; s/p MVR with no MR; moderate LAE; moderate RV dysfunction; moderate TR and mild pulmonary hypertension.  Echo: 01/13/19: IMPRESSIONS    1. Left ventricular ejection fraction, by visual estimation, is 60 to  65%. The left ventricle has normal function. There is severely increased  left ventricular hypertrophy.  2. Left ventricular diastolic function could not be evaluated.  3. The  left ventricle has no regional wall motion abnormalities.  4. Global right ventricle has normal systolic function.The right  ventricular size is normal. No increase in right ventricular wall  thickness.  5. Left atrial size was normal.  6. Right atrial size was normal.  7. The mitral valve has been repaired/replaced. No evidence of mitral  valve regurgitation.  8. The tricuspid valve is grossly normal. Tricuspid valve regurgitation  is trivial.  9. The aortic valve is abnormal. Aortic valve regurgitation is mild.  Mild  aortic valve sclerosis without stenosis.  10. Pulmonic regurgitation is mild.  11. The pulmonic valve was normal in structure. Pulmonic valve  regurgitation is mild.  12. The atrial septum is grossly normal.   Assessment / Plan: 1. HOCM.  Now s/p septal myectomy on 11/05/15. Excellent result by follow up Echo. No significant residual LVOT gradient.   2. Severe MR s/p MVR with #29 St. Jude Biocor valve. Routine SBE prophylaxis. Normal function by Echo.  3.  Acute systolic CHF. New decrease in LV function this fall with atrial flutter. EF 25-30%. Likely tachycardia mediated with new Atrial flutter with RVR. Now s/p DCCV with improved symptoms.  EF has normalized based on Echo in December. On Coreg, lasix and  Entresto 24/26 mg bid.    4. Paroxysmal Atrial fibrillation post op open heart surgery. S/p left sided MAZE. Seen in September with  new onset Atrial flutter. Off diltiazem and on Coreg due to low EF. DCCV on 10/23/18. Had been on amiodarone post op heart surgery. Patient questions if this was the cause of her optic nerve problem. Continue Xarelto long term.  If recurrent arrhythmia will need EP to see.   5. Hypercholesterolemia. On low dose Crestor. No history of CAD  6. CKD stage 2-3.   7. Elevated TSH. 6.  Repeat 4.5. normal T4 and T3 levels. Last TSH down to 4.5.    I will follow up in 6 months

## 2019-09-25 ENCOUNTER — Ambulatory Visit: Payer: Medicare Other | Admitting: Cardiology

## 2019-10-12 NOTE — Progress Notes (Signed)
Melinda Gonzales Date of Birth: [birth year]   History of Present Illness: Melinda Gonzales is seen for follow up of HOCM  She was evaluated in 2011 with a Myoview stress test which was normal. She had an Echo at that time that showed moderate LVH with EF 65-70%. Mild MR. She does have a history of murmur. She is not a smoker. In early 2016 she presented with increased dyspnea and an Echo and cardiac MRI were done. These with both consistent with HOCM. LVOT gradient of 112 mm Hg at rest. She was started on low dose lasix and Toprol XL with good response initially with improved dyspnea.  Seen last year with symptoms of increased dyspnea. No increased edema or orthopnea but more dyspnea with walking or lifting. No chest pain. No dizziness or palpitations. Energy level has decreased. Repeat Echo showed severe LVOT gradient and MR.  She underwent cardiac cath with findings of LVOT gradient and MR. No significant CAD. Pulmonary HTN. She was referred to the [facility] clinic and underwent surgery on 11/05/15. Prior to surgery she developed Afib and had a DCCV. Surgery  included a septal myectomy, MVR with a #29 mm St. Jude  Biocor valve, left sided Cryo MAZE, and ligation of the left atrial appendage. Her post op course was remarkable for paroxysmal atrial fibrillation and she was started on amiodarone and Xarelto.  Ecg showed a LBBB. Repeat Echo on 11/11/15 showed normal LV function with EF 60%. Mean MV gradient 8 mm Hg. Trace MR. Mild TR. There was a fixed LVOT gradient of 32 mm Hg peak and mean of 16 mm Hg. Repeat Echo 02/03/16 showed no LVOT gradient and normal MV prosthesis function.   She was seen in September 2020 with symptoms of persistent tachycardia and SOB. HR in the 120s. Echo was obtained and showed significant decline in LV function with EF 25-30%. After review of Ecg tracings and reviewing with EP it appeared she was in Atrial flutter. BNP was 467. TSH was mildly elevated 6.0. She was started on  Coreg. Lasix was increased she was on Xarelto. She underwent  DCCV on 10/23/18. Subsequent Echo in December showed EF had returned to normal.   On follow up today she is doing very well.  No dyspnea. No edema. No chest pain or dizziness.   HR has been in the 60s. Feels well.    Current Outpatient Medications on File Prior to Visit  Medication Sig Dispense Refill   carvedilol (COREG) 6.25 MG tablet TAKE 1 TABLET (6.25 MG TOTAL) BY MOUTH 2 (TWO) TIMES DAILY. 180 tablet 2   furosemide (LASIX) 40 MG tablet Take 1 tablet (40 mg total) by mouth daily. 90 tablet 3   potassium chloride SA (K-DUR) 20 MEQ tablet Take 1 tablet (20 mEq total) by mouth daily. 90 tablet 3   rosuvastatin (CRESTOR) 10 MG tablet Take 10 mg by mouth daily.     sacubitril-valsartan (ENTRESTO) 24-26 MG Take 1 tablet by mouth 2 (two) times daily. 180 tablet 2   XARELTO 15 MG TABS tablet TAKE 1 TABLET (15 MG TOTAL) BY MOUTH DAILY WITH SUPPER. 30 tablet 3   No current facility-administered medications on file prior to visit.    Allergies  Allergen Reactions   Oxycodone Nausea And Vomiting    And the sweats    Past Medical History:  Diagnosis Date   Chronic diastolic CHF (congestive heart failure) ([place]) 07/06/2015   Dyspnea    Heart murmur    Hypercholesterolemia  Hypertrophic obstructive cardiomyopathy (HOCM) ([facility])    LVH (left ventricular hypertrophy)    WITH NORMAL SYSTOLIC FUNCTION   SOB (shortness of breath)     Past Surgical History:  Procedure Laterality Date   CARDIAC CATHETERIZATION N/A 07/06/2015   Procedure: Right/Left Heart Cath and Coronary Angiography;  Surgeon: Aleksis Jiggetts M [place], MD;  Location: [place] CV LAB;  Service: Cardiovascular;  Laterality: N/A;   CARDIOVERSION N/A 10/23/2018   Procedure: CARDIOVERSION;  Surgeon: Donato Heinz, MD;  Location: [facility] ENDOSCOPY;  Service: Endoscopy;  Laterality: N/A;   MITRAL VALVE REPLACEMENT N/A 11/15/2015   ligation of left atrial  appendage, maze procedure at [place] History   Tobacco Use  Smoking Status Former Smoker   Quit date: 05/26/1965   Years since quitting: 54.4  Smokeless Tobacco Never Used    Social History   Substance and Sexual Activity  Alcohol Use No    Family History  Problem Relation Age of Onset   Heart failure Mother 30   Heart attack Father 74    Review of Systems: As noted in HPI.   All other systems were reviewed and are negative.  Physical Exam: BP (!) 120/59    Pulse 60    Ht 5\' 2"  (1.575 m)    Wt 183 lb 3.2 oz (83.1 kg)    SpO2 97%    BMI 33.51 kg/m   Body mass index is 33.51 kg/m. GENERAL:  Well appearing obese WF in NAD HEENT:  PERRL, EOMI, sclera are clear. Oropharynx is clear. NECK:  No jugular venous distention, carotid upstroke brisk and symmetric, no bruits, no thyromegaly or adenopathy LUNGS:  Clear to auscultation bilaterally CHEST:  Unremarkable HEART:  RRR,  PMI not displaced or sustained,S1 and S2 within normal limits, no S3, no S4: no clicks, no rubs, gr 1/6 systolic murmur at the apex>> RUSB ABD:  Soft, nontender. BS +, no masses or bruits. No hepatomegaly, no splenomegaly EXT:  2 + pulses throughout, no edema, no cyanosis no clubbing SKIN:  Warm and dry.  No rashes NEURO:  Alert and oriented x 3. Cranial nerves II through XII intact. PSYCH:  Cognitively intact    LABORATORY DATA:   Lab Results  Component Value Date   WBC 8.6 10/23/2018   HGB 13.4 10/23/2018   HCT 40.5 10/23/2018   PLT 230 10/23/2018   GLUCOSE 145 (H) 11/01/2018   CHOL 196 02/24/2016   TRIG 115 02/24/2016   HDL 58 02/24/2016   LDLCALC 115 (H) 02/24/2016   ALT 14 11/19/2015   AST 19 11/19/2015   NA 142 11/01/2018   K 4.5 11/01/2018   CL 104 11/01/2018   CREATININE 1.43 (H) 11/01/2018   BUN 23 11/01/2018   CO2 21 11/01/2018   TSH 4.520 (H) 11/01/2018   INR 1.03 07/06/2015   Labs dated 03/11/16: creatinine 1.6 Dated 07/27/16: cholesterol 192,  triglycerides 165, HDL 52, LDL 107. Creatinine 1.27. Potassium and ALT normal.  Dated 02/07/17: BUN 27, creatinine 1.25. Glucose 113. Other chemistries normal. Cholesterol 199, triglycerides 212, HDL 44, LDL 113.  Dated 02/08/18: cholesterol 161, triglycerides 209, HDL 44, LDL 75. LFTs normal. Dated 10/13/19: A1c 6.7%. triglycerides 269, HDL 39. Unable to see other results in [place] today shows NSR with occ. PVC. LBBB. Rate 60. I have personally reviewed and interpreted this study.  Echo 02/03/16: Study Conclusions  - Left ventricle: The cavity size was normal. There was moderate   concentric hypertrophy. Systolic  function was normal. The   estimated ejection fraction was in the range of 60% to 65%. Wall   motion was normal; there were no regional wall motion   abnormalities. The study is not technically sufficient to allow   evaluation of LV diastolic function due to the presence of a   bioprosthetic mitral valve. - Aortic valve: Valve mobility was mildly restricted. There was   mild stenosis. There was mild regurgitation. - Mitral valve: Mildly calcified annulus. A 3mm St. Jude biocor   bioprosthesis was present and functioning well. - Left atrium: The atrium was severely dilated. - Right ventricle: The cavity size was normal. Wall thickness was   normal. Systolic function was normal. - Tricuspid valve: There was mild regurgitation. - Pulmonary arteries: Systolic pressure was mildly increased. PA   peak pressure: 42 mm Hg (S).  Impressions:  - Since the last echo 06/15/2015, a bioprosthetic mitral valve is   present and functioning well. Septal wall thickness is now 1.5 cm   compared with 1.8 cm pre-operatively.  Echo 04/19/17: Study Conclusions  - Valve surgery. Mitral valve replacement with a [employer] Medical   porcine valve. 69mm Biocor bioprosthetic valve. - Left ventricle: The cavity size was normal. Wall thickness was   increased in a pattern of mild LVH. Systolic  function was normal.   The estimated ejection fraction was in the range of 55% to 60%.   Wall motion was normal; there were no regional wall motion   abnormalities. Doppler parameters are consistent with a   reversible restrictive pattern, indicative of decreased left   ventricular diastolic compliance and/or increased left atrial   pressure (grade 3 diastolic dysfunction). - Aortic valve: There was trivial regurgitation. Mean gradient (S):   11 mm Hg. Peak gradient (S): 19 mm Hg. - Mitral valve: A bioprosthesis was present. The prosthesis had a   normal range of motion. Mean gradient (D): 5 mm Hg. - Left atrium: The atrium was mildly dilated. - Pulmonary arteries: Systolic pressure was moderately increased.   PA peak pressure: 56 mm Hg (S).  Echocardiogram 10/15/2018: 1. The left ventricle has severely reduced systolic function, with an ejection fraction of 25-30%. The cavity size was normal. Left ventricular diastolic function could not be evaluated. Elevated mean left atrial pressure Left ventricular diffuse  hypokinesis. 2. The right ventricle has moderately reduced systolic function. The cavity was normal. Right ventricular systolic pressure is mildly elevated with an estimated pressure of 41.8 mmHg. 3. Left atrial size was moderately dilated. 4. The tricuspid valve is grossly normal. Tricuspid valve regurgitation is moderate. 5. The aortic valve has an indeterminate number of cusps. Mild calcification of the aortic valve. Aortic valve regurgitation is trivial by color flow Doppler. No stenosis of the aortic valve. 6. The aorta is normal unless otherwise noted. 7. The inferior vena cava was dilated in size with >50% respiratory variability. 8. Septal dyssynergy and global hypokinesis; overall severe LV dysfunction; trace AI; s/p MVR with no MR; moderate LAE; moderate RV dysfunction; moderate TR and mild pulmonary hypertension.  Echo: 01/13/19: IMPRESSIONS    1. Left  ventricular ejection fraction, by visual estimation, is 60 to  65%. The left ventricle has normal function. There is severely increased  left ventricular hypertrophy.  2. Left ventricular diastolic function could not be evaluated.  3. The left ventricle has no regional wall motion abnormalities.  4. Global right ventricle has normal systolic function.The right  ventricular size is normal. No increase in right  ventricular wall  thickness.  5. Left atrial size was normal.  6. Right atrial size was normal.  7. The mitral valve has been repaired/replaced. No evidence of mitral  valve regurgitation.  8. The tricuspid valve is grossly normal. Tricuspid valve regurgitation  is trivial.  9. The aortic valve is abnormal. Aortic valve regurgitation is mild. Mild  aortic valve sclerosis without stenosis.  10. Pulmonic regurgitation is mild.  11. The pulmonic valve was normal in structure. Pulmonic valve  regurgitation is mild.  12. The atrial septum is grossly normal.   Assessment / Plan: 1. HOCM.  s/p septal myectomy on 11/05/15. Excellent result by follow up Echo. No significant residual LVOT gradient.   2. Severe MR s/p MVR with #29 St. Jude Biocor valve. Routine SBE prophylaxis. Normal function by Echo.  3.  Acute systolic CHF. New decrease in LV function associated with atrial flutter. EF 25-30%. Likely tachycardia mediated with new Atrial flutter with RVR. Now s/p DCCV with improved symptoms.  EF has normalized based on Echo in December. On Coreg, lasix and  Entresto 24/26 mg bid.    4. Paroxysmal Atrial fibrillation post op open heart surgery. S/p left sided MAZE. Seen in September with  new onset Atrial flutter. Off diltiazem and on Coreg due to low EF. DCCV on 10/23/18. Had been on amiodarone post op heart surgery. Patient questions if this was the cause of her optic nerve problem. Continue Xarelto long term.  If recurrent arrhythmia will need EP to see.   5. Hypercholesterolemia.  On low dose Crestor. No history of CAD. Request copy of full labs from Dr Laurann [place].  6. CKD stage 2-3.     I will follow up in 6 months

## 2019-10-13 DIAGNOSIS — Z1389 Encounter for screening for other disorder: Secondary | ICD-10-CM | POA: Diagnosis not present

## 2019-10-13 DIAGNOSIS — N183 Chronic kidney disease, stage 3 unspecified: Secondary | ICD-10-CM | POA: Diagnosis not present

## 2019-10-13 DIAGNOSIS — K219 Gastro-esophageal reflux disease without esophagitis: Secondary | ICD-10-CM | POA: Diagnosis not present

## 2019-10-13 DIAGNOSIS — E7800 Pure hypercholesterolemia, unspecified: Secondary | ICD-10-CM | POA: Diagnosis not present

## 2019-10-13 DIAGNOSIS — I421 Obstructive hypertrophic cardiomyopathy: Secondary | ICD-10-CM | POA: Diagnosis not present

## 2019-10-13 DIAGNOSIS — Z0000 Encounter for general adult medical examination without abnormal findings: Secondary | ICD-10-CM | POA: Diagnosis not present

## 2019-10-13 DIAGNOSIS — E669 Obesity, unspecified: Secondary | ICD-10-CM | POA: Diagnosis not present

## 2019-10-13 DIAGNOSIS — R7303 Prediabetes: Secondary | ICD-10-CM | POA: Diagnosis not present

## 2019-10-13 DIAGNOSIS — Z23 Encounter for immunization: Secondary | ICD-10-CM | POA: Diagnosis not present

## 2019-10-13 DIAGNOSIS — I420 Dilated cardiomyopathy: Secondary | ICD-10-CM | POA: Diagnosis not present

## 2019-10-13 DIAGNOSIS — Y712 Prosthetic and other implants, materials and accessory cardiovascular devices associated with adverse incidents: Secondary | ICD-10-CM | POA: Diagnosis not present

## 2019-10-13 DIAGNOSIS — I4891 Unspecified atrial fibrillation: Secondary | ICD-10-CM | POA: Diagnosis not present

## 2019-10-14 ENCOUNTER — Other Ambulatory Visit: Payer: Self-pay

## 2019-10-14 ENCOUNTER — Encounter: Payer: Self-pay | Admitting: Cardiology

## 2019-10-14 ENCOUNTER — Ambulatory Visit (INDEPENDENT_AMBULATORY_CARE_PROVIDER_SITE_OTHER): Payer: Medicare Other | Admitting: Cardiology

## 2019-10-14 VITALS — BP 120/59 | HR 60 | Ht 62.0 in | Wt 183.2 lb

## 2019-10-14 DIAGNOSIS — N1831 Chronic kidney disease, stage 3a: Secondary | ICD-10-CM

## 2019-10-14 DIAGNOSIS — I484 Atypical atrial flutter: Secondary | ICD-10-CM | POA: Diagnosis not present

## 2019-10-14 DIAGNOSIS — I421 Obstructive hypertrophic cardiomyopathy: Secondary | ICD-10-CM | POA: Diagnosis not present

## 2019-10-14 DIAGNOSIS — Z952 Presence of prosthetic heart valve: Secondary | ICD-10-CM

## 2019-10-14 MED ORDER — ROSUVASTATIN CALCIUM 10 MG PO TABS: 10.0000 mg | ORAL_TABLET | Freq: Every day | ORAL | 3 refills | Status: DC

## 2019-11-04 DIAGNOSIS — Z23 Encounter for immunization: Secondary | ICD-10-CM | POA: Diagnosis not present

## 2019-11-10 ENCOUNTER — Other Ambulatory Visit: Payer: Self-pay | Admitting: Medical

## 2019-11-10 DIAGNOSIS — Z85828 Personal history of other malignant neoplasm of skin: Secondary | ICD-10-CM | POA: Diagnosis not present

## 2019-11-10 DIAGNOSIS — D485 Neoplasm of uncertain behavior of skin: Secondary | ICD-10-CM | POA: Diagnosis not present

## 2019-11-10 DIAGNOSIS — D0439 Carcinoma in situ of skin of other parts of face: Secondary | ICD-10-CM | POA: Diagnosis not present

## 2019-11-10 NOTE — Telephone Encounter (Signed)
Rx has been sent to the pharmacy electronically. ° °

## 2019-11-11 DIAGNOSIS — H47012 Ischemic optic neuropathy, left eye: Secondary | ICD-10-CM | POA: Diagnosis not present

## 2019-11-11 DIAGNOSIS — Z961 Presence of intraocular lens: Secondary | ICD-10-CM | POA: Diagnosis not present

## 2019-11-11 DIAGNOSIS — H2511 Age-related nuclear cataract, right eye: Secondary | ICD-10-CM | POA: Diagnosis not present

## 2019-11-11 DIAGNOSIS — H47011 Ischemic optic neuropathy, right eye: Secondary | ICD-10-CM | POA: Diagnosis not present

## 2019-11-11 DIAGNOSIS — H401123 Primary open-angle glaucoma, left eye, severe stage: Secondary | ICD-10-CM | POA: Diagnosis not present

## 2019-11-11 DIAGNOSIS — H4720 Unspecified optic atrophy: Secondary | ICD-10-CM | POA: Diagnosis not present

## 2019-11-11 DIAGNOSIS — H353132 Nonexudative age-related macular degeneration, bilateral, intermediate dry stage: Secondary | ICD-10-CM | POA: Diagnosis not present

## 2019-11-11 DIAGNOSIS — H35033 Hypertensive retinopathy, bilateral: Secondary | ICD-10-CM | POA: Diagnosis not present

## 2019-11-26 ENCOUNTER — Encounter (INDEPENDENT_AMBULATORY_CARE_PROVIDER_SITE_OTHER): Payer: Medicare Other | Admitting: Ophthalmology

## 2019-12-10 ENCOUNTER — Other Ambulatory Visit: Payer: Self-pay | Admitting: Cardiology

## 2019-12-10 ENCOUNTER — Other Ambulatory Visit: Payer: Self-pay | Admitting: Medical

## 2020-01-09 ENCOUNTER — Other Ambulatory Visit: Payer: Self-pay | Admitting: Cardiology

## 2020-01-09 NOTE — Telephone Encounter (Signed)
Prescription refill request for Xarelto received.  Indication: Atrial Fibrillation Last office visit:  10/2019  [place] Weight:83.1 kg Age: 85 Scr: 1.79  10/2019 CrCl: 31.24 ml/min  Prescription refilled

## 2020-01-13 DIAGNOSIS — R7303 Prediabetes: Secondary | ICD-10-CM | POA: Diagnosis not present

## 2020-01-14 DIAGNOSIS — L718 Other rosacea: Secondary | ICD-10-CM | POA: Diagnosis not present

## 2020-01-14 DIAGNOSIS — Z85828 Personal history of other malignant neoplasm of skin: Secondary | ICD-10-CM | POA: Diagnosis not present

## 2020-02-09 ENCOUNTER — Other Ambulatory Visit: Payer: Self-pay | Admitting: Cardiology

## 2020-03-09 ENCOUNTER — Other Ambulatory Visit: Payer: Self-pay | Admitting: Cardiology

## 2020-03-24 DIAGNOSIS — M1712 Unilateral primary osteoarthritis, left knee: Secondary | ICD-10-CM | POA: Diagnosis not present

## 2020-03-24 DIAGNOSIS — M1711 Unilateral primary osteoarthritis, right knee: Secondary | ICD-10-CM | POA: Diagnosis not present

## 2020-03-29 ENCOUNTER — Telehealth: Payer: Self-pay | Admitting: Cardiology

## 2020-03-29 NOTE — Telephone Encounter (Signed)
Patient's husband is requesting to speak with Dr. Doug Sou nurse regarding the patient's upcoming appointment on 04/12/20. He states he has a question for the nurse and he will discuss further when he speaks with her.

## 2020-03-29 NOTE — Telephone Encounter (Signed)
Returned call to patients husband (okay per DPR)  who states that he was calling in regards to symptoms he has been experiencing. Please see telephone encounter in patients husbands chart.

## 2020-04-08 ENCOUNTER — Other Ambulatory Visit: Payer: Self-pay | Admitting: Cardiology

## 2020-04-08 NOTE — Telephone Encounter (Signed)
45f, 83.1kg, scr Creatinine, Serum 1.760 mg/ 01/13/2020, ccr 32, lovw/jordan 10/14/19. Pt requesting refill of xarelto 15 mg and was granted. Will route to [facility] refill pool for entresto

## 2020-04-09 NOTE — Progress Notes (Signed)
[name] Date of Birth: 24-Apr-1934   History of Present Illness: Mrs. Norma Fredrickson is seen for follow up of HOCM  She was evaluated in 2011 with a Myoview stress test which was normal. She had an Echo at that time that showed moderate LVH with EF 65-70%. Mild MR. She does have a history of murmur. She is not a smoker. In early 2016 she presented with increased dyspnea and an Echo and cardiac MRI were done. These with both consistent with HOCM. LVOT gradient of 112 mm Hg at rest. She was started on low dose lasix and Toprol XL with good response initially with improved dyspnea.  Seen last year with symptoms of increased dyspnea. No increased edema or orthopnea but more dyspnea with walking or lifting. No chest pain. No dizziness or palpitations. Energy level has decreased. Repeat Echo showed severe LVOT gradient and MR.  She underwent cardiac cath with findings of LVOT gradient and MR. No significant CAD. Pulmonary HTN. She was referred to the [facility] clinic and underwent surgery on 11/05/15. Prior to surgery she developed Afib and had a DCCV. Surgery  included a septal myectomy, MVR with a #29 mm [employer] Jude  Biocor valve, left sided Cryo MAZE, and ligation of the left atrial appendage. Her post op course was remarkable for paroxysmal atrial fibrillation and she was started on amiodarone and Xarelto.  Ecg showed a LBBB. Repeat Echo on 11/11/15 showed normal LV function with EF 60%. Mean MV gradient 8 mm Hg. Trace MR. Mild TR. There was a fixed LVOT gradient of 32 mm Hg peak and mean of 16 mm Hg. Repeat Echo 02/03/16 showed no LVOT gradient and normal MV prosthesis function.   She was seen in September 2020 with symptoms of persistent tachycardia and SOB. HR in the 120s. Echo was obtained and showed significant decline in LV function with EF 25-30%. After review of Ecg tracings and reviewing with EP it appeared she was in Atrial flutter. BNP was 467. TSH was mildly elevated 6.0. She was started on  Coreg. Lasix was increased she was on Xarelto. She underwent  DCCV on 10/23/18. Subsequent Echo in December showed EF had returned to normal.   On follow up today she is doing very well.  No dyspnea. No edema. No chest pain or dizziness.   Husband noted HR sometimes will go up to 104 but then comes back down. She has gained weight. Notes she drinks a half bottle of wine daily.    Current Outpatient Medications on File Prior to Visit  Medication Sig Dispense Refill  . carvedilol (COREG) 6.25 MG tablet TAKE 1 TABLET BY MOUTH TWICE A DAY 180 tablet 3  . furosemide (LASIX) 40 MG tablet TAKE 1 TABLET (40 MG TOTAL) BY MOUTH DAILY. 30 tablet 8  . potassium chloride SA (KLOR-CON) 20 MEQ tablet TAKE 1 TABLET BY MOUTH DAILY 90 tablet 2  . rosuvastatin (CRESTOR) 10 MG tablet Take 1 tablet (10 mg total) by mouth daily. 90 tablet 3  . sacubitril-valsartan (ENTRESTO) 24-26 MG Take 1 tablet by mouth 2 (two) times daily. 60 tablet 1  . XARELTO 15 MG TABS tablet TAKE 1 TABLET BY MOUTH DAILY WITH SUPPER 30 tablet 3   No current facility-administered medications on file prior to visit.    Allergies  Allergen Reactions  . Oxycodone Nausea And Vomiting    And the sweats    Past Medical History:  Diagnosis Date  . Chronic diastolic CHF (congestive heart failure) ([place])  07/06/2015  . Dyspnea   . Heart murmur   . Hypercholesterolemia   . Hypertrophic obstructive cardiomyopathy (HOCM) ([place])   . LVH (left ventricular hypertrophy)    WITH NORMAL SYSTOLIC FUNCTION  . SOB (shortness of breath)     Past Surgical History:  Procedure Laterality Date  . CARDIAC CATHETERIZATION N/A 07/06/2015   Procedure: Right/Left Heart Cath and Coronary Angiography;  Surgeon: Tarique Loveall M [place], MD;  Location: [place] [facility];  Service: Cardiovascular;  Laterality: N/A;  . CARDIOVERSION N/A 10/23/2018   Procedure: CARDIOVERSION;  Surgeon: Donato Heinz, MD;  Location: [facility] ENDOSCOPY;  Service: Endoscopy;  Laterality: N/A;   . MITRAL VALVE REPLACEMENT N/A 11/15/2015   ligation of left atrial appendage, maze procedure at [place] Use  Smoking Status Former Smoker  . Quit date: 05/26/1965  . Years since quitting: 54.9  Smokeless Tobacco Never Used    Social History   Substance and Sexual Activity  Alcohol Use No    Family History  Problem Relation Age of Onset  . Heart failure Mother 16  . Heart attack Father 52    Review of Systems: As noted in HPI.   All other systems were reviewed and are negative.  Physical Exam: BP 128/66 (BP Location: Right Arm, Patient Position: Sitting, Cuff Size: Large)   Pulse 68   Ht 5\' 2"  (1.575 m)   Wt 194 lb (88 kg)   BMI 35.48 kg/m   Body mass index is 35.48 kg/m. GENERAL:  Well appearing obese WF in NAD HEENT:  PERRL, EOMI, sclera are clear. Oropharynx is clear. NECK:  No jugular venous distention, carotid upstroke brisk and symmetric, no bruits, no thyromegaly or adenopathy LUNGS:  Clear to auscultation bilaterally CHEST:  Unremarkable HEART:  RRR,  PMI not displaced or sustained,S1 and S2 within normal limits, no S3, no S4: no clicks, no rubs, gr 1/6 systolic murmur at the apex>> RUSB ABD:  Soft, nontender. BS +, no masses or bruits. No hepatomegaly, no splenomegaly EXT:  2 + pulses throughout, no edema, no cyanosis no clubbing SKIN:  Warm and dry.  No rashes NEURO:  Alert and oriented x 3. Cranial nerves II through XII intact. PSYCH:  Cognitively intact    LABORATORY DATA:   Lab Results  Component Value Date   WBC 8.6 10/23/2018   HGB 13.4 10/23/2018   HCT 40.5 10/23/2018   PLT 230 10/23/2018   GLUCOSE 145 (H) 11/01/2018   CHOL 196 02/24/2016   TRIG 115 02/24/2016   HDL 58 02/24/2016   LDLCALC 115 (H) 02/24/2016   ALT 14 11/19/2015   AST 19 11/19/2015   NA 142 11/01/2018   K 4.5 11/01/2018   CL 104 11/01/2018   CREATININE 1.43 (H) 11/01/2018   BUN 23 11/01/2018   CO2 21 11/01/2018   TSH 4.520  (H) 11/01/2018   INR 1.03 07/06/2015   Labs dated 03/11/16: creatinine 1.6 Dated 07/27/16: cholesterol 192, triglycerides 165, HDL 52, LDL 107. Creatinine 1.27. Potassium and ALT normal.  Dated 02/07/17: BUN 27, creatinine 1.25. Glucose 113. Other chemistries normal. Cholesterol 199, triglycerides 212, HDL 44, LDL 113.  Dated 02/08/18: cholesterol 161, triglycerides 209, HDL 44, LDL 75. LFTs normal. Dated 10/13/19: A1c 6.7%. triglycerides 269, HDL 39. Unable to see other results in [place] Dated 01/13/20: BUN 33, creatinine 1.76. glucose 137. Potassium 5.1. sodium 144. A1c 6.4%    Echo 02/03/16: Study Conclusions  - Left ventricle: The cavity  size was normal. There was moderate   concentric hypertrophy. Systolic function was normal. The   estimated ejection fraction was in the range of 60% to 65%. Wall   motion was normal; there were no regional wall motion   abnormalities. The study is not technically sufficient to allow   evaluation of LV diastolic function due to the presence of a   bioprosthetic mitral valve. - Aortic valve: Valve mobility was mildly restricted. There was   mild stenosis. There was mild regurgitation. - Mitral valve: Mildly calcified annulus. A 36mm [employer] Jude biocor   bioprosthesis was present and functioning well. - Left atrium: The atrium was severely dilated. - Right ventricle: The cavity size was normal. Wall thickness was   normal. Systolic function was normal. - Tricuspid valve: There was mild regurgitation. - Pulmonary arteries: Systolic pressure was mildly increased. PA   peak pressure: 42 mm Hg (S).  Impressions:  - Since the last echo 06/15/2015, a bioprosthetic mitral valve is   present and functioning well. Septal wall thickness is now 1.5 cm   compared with 1.8 cm pre-operatively.  Echo 04/19/17: Study Conclusions  - Valve surgery. Mitral valve replacement with a [employer]   porcine valve. 66mm Biocor bioprosthetic valve. - Left ventricle: The  cavity size was normal. Wall thickness was   increased in a pattern of mild LVH. Systolic function was normal.   The estimated ejection fraction was in the range of 55% to 60%.   Wall motion was normal; there were no regional wall motion   abnormalities. Doppler parameters are consistent with a   reversible restrictive pattern, indicative of decreased left   ventricular diastolic compliance and/or increased left atrial   pressure (grade 3 diastolic dysfunction). - Aortic valve: There was trivial regurgitation. Mean gradient (S):   11 mm Hg. Peak gradient (S): 19 mm Hg. - Mitral valve: A bioprosthesis was present. The prosthesis had a   normal range of motion. Mean gradient (D): 5 mm Hg. - Left atrium: The atrium was mildly dilated. - Pulmonary arteries: Systolic pressure was moderately increased.   PA peak pressure: 56 mm Hg (S).  Echocardiogram 10/15/2018: 1. The left ventricle has severely reduced systolic function, with an ejection fraction of 25-30%. The cavity size was normal. Left ventricular diastolic function could not be evaluated. Elevated mean left atrial pressure Left ventricular diffuse  hypokinesis. 2. The right ventricle has moderately reduced systolic function. The cavity was normal. Right ventricular systolic pressure is mildly elevated with an estimated pressure of 41.8 mmHg. 3. Left atrial size was moderately dilated. 4. The tricuspid valve is grossly normal. Tricuspid valve regurgitation is moderate. 5. The aortic valve has an indeterminate number of cusps. Mild calcification of the aortic valve. Aortic valve regurgitation is trivial by color flow Doppler. No stenosis of the aortic valve. 6. The aorta is normal unless otherwise noted. 7. The inferior vena cava was dilated in size with >50% respiratory variability. 8. Septal dyssynergy and global hypokinesis; overall severe LV dysfunction; trace AI; s/p MVR with no MR; moderate LAE; moderate RV dysfunction; moderate  TR and mild pulmonary hypertension.  Echo: 01/13/19: IMPRESSIONS    1. Left ventricular ejection fraction, by visual estimation, is 60 to  65%. The left ventricle has normal function. There is severely increased  left ventricular hypertrophy.  2. Left ventricular diastolic function could not be evaluated.  3. The left ventricle has no regional wall motion abnormalities.  4. Global right ventricle has normal systolic  function.The right  ventricular size is normal. No increase in right ventricular wall  thickness.  5. Left atrial size was normal.  6. Right atrial size was normal.  7. The mitral valve has been repaired/replaced. No evidence of mitral  valve regurgitation.  8. The tricuspid valve is grossly normal. Tricuspid valve regurgitation  is trivial.  9. The aortic valve is abnormal. Aortic valve regurgitation is mild. Mild  aortic valve sclerosis without stenosis.  10. Pulmonic regurgitation is mild.  11. The pulmonic valve was normal in structure. Pulmonic valve  regurgitation is mild.  12. The atrial septum is grossly normal.   Assessment / Plan: 1. HOCM.  s/p septal myectomy on 11/05/15. Excellent result by follow up Echo. No significant residual LVOT gradient.   2. Severe MR s/p MVR with #29 [employer] Biocor valve. Routine SBE prophylaxis. Normal function by Echo.  3.  Chronic systolic CHF. LV dysfunction associated with atrial flutter. EF 25-30%. Likely tachycardia mediated with new Atrial flutter with RVR. Now s/p DCCV with improved symptoms.  EF has normalized based on Echo in December 2020. On Coreg, lasix and  Entresto 24/26 mg bid.    4. Paroxysmal Atrial fibrillation post op open heart surgery. S/p left sided MAZE. Seen in September with  new onset Atrial flutter.  DCCV on 10/23/18. Had been on amiodarone post op heart surgery. Patient questions if this was the cause of her optic nerve problem. Continue Xarelto long term.  Appears to be in NSR now.   5.  Hypercholesterolemia. On low dose Crestor. No history of CAD.  6. CKD stage 2-3.   7. Obesity. Discussed weight loss strategy of limiting Etoh intake and restricting carbohydrate intake.     I will follow up in 6 months

## 2020-04-12 ENCOUNTER — Ambulatory Visit (INDEPENDENT_AMBULATORY_CARE_PROVIDER_SITE_OTHER): Payer: Medicare Other | Admitting: Cardiology

## 2020-04-12 ENCOUNTER — Encounter: Payer: Self-pay | Admitting: Cardiology

## 2020-04-12 ENCOUNTER — Other Ambulatory Visit: Payer: Self-pay

## 2020-04-12 VITALS — BP 128/66 | HR 68 | Ht 62.0 in | Wt 194.0 lb

## 2020-04-12 DIAGNOSIS — I480 Paroxysmal atrial fibrillation: Secondary | ICD-10-CM

## 2020-04-12 DIAGNOSIS — Z952 Presence of prosthetic heart valve: Secondary | ICD-10-CM

## 2020-04-12 DIAGNOSIS — I421 Obstructive hypertrophic cardiomyopathy: Secondary | ICD-10-CM

## 2020-04-12 DIAGNOSIS — N1831 Chronic kidney disease, stage 3a: Secondary | ICD-10-CM | POA: Diagnosis not present

## 2020-04-12 DIAGNOSIS — I5032 Chronic diastolic (congestive) heart failure: Secondary | ICD-10-CM

## 2020-04-12 NOTE — Patient Instructions (Signed)
Reduce your wine intake  You need to lose weight  Stay active

## 2020-05-11 DIAGNOSIS — H401123 Primary open-angle glaucoma, left eye, severe stage: Secondary | ICD-10-CM | POA: Diagnosis not present

## 2020-05-11 DIAGNOSIS — H47012 Ischemic optic neuropathy, left eye: Secondary | ICD-10-CM | POA: Diagnosis not present

## 2020-05-11 DIAGNOSIS — H47011 Ischemic optic neuropathy, right eye: Secondary | ICD-10-CM | POA: Diagnosis not present

## 2020-05-20 DIAGNOSIS — Z23 Encounter for immunization: Secondary | ICD-10-CM | POA: Diagnosis not present

## 2020-06-10 ENCOUNTER — Other Ambulatory Visit: Payer: Self-pay | Admitting: Cardiology

## 2020-07-19 DIAGNOSIS — D485 Neoplasm of uncertain behavior of skin: Secondary | ICD-10-CM | POA: Diagnosis not present

## 2020-07-19 DIAGNOSIS — D040 Carcinoma in situ of skin of lip: Secondary | ICD-10-CM | POA: Diagnosis not present

## 2020-07-19 DIAGNOSIS — L570 Actinic keratosis: Secondary | ICD-10-CM | POA: Diagnosis not present

## 2020-07-19 DIAGNOSIS — Z85828 Personal history of other malignant neoplasm of skin: Secondary | ICD-10-CM | POA: Diagnosis not present

## 2020-08-11 ENCOUNTER — Other Ambulatory Visit: Payer: Self-pay | Admitting: Medical

## 2020-08-11 ENCOUNTER — Other Ambulatory Visit: Payer: Self-pay | Admitting: Cardiology

## 2020-08-11 NOTE — Telephone Encounter (Signed)
This is Dr. Jordan's pt. °

## 2020-08-11 NOTE — Telephone Encounter (Signed)
51F, 88KG, Creatinine, Serum 1.760 mg/ 01/13/2020, LOVW/ [place] 04/12/20, CCR 33.1

## 2020-09-09 ENCOUNTER — Other Ambulatory Visit: Payer: Self-pay | Admitting: Medical

## 2020-09-09 ENCOUNTER — Other Ambulatory Visit: Payer: Self-pay | Admitting: Cardiology

## 2020-10-06 DIAGNOSIS — M1712 Unilateral primary osteoarthritis, left knee: Secondary | ICD-10-CM | POA: Diagnosis not present

## 2020-10-07 ENCOUNTER — Other Ambulatory Visit: Payer: Self-pay | Admitting: Cardiology

## 2020-10-09 DIAGNOSIS — Z23 Encounter for immunization: Secondary | ICD-10-CM | POA: Diagnosis not present

## 2020-11-16 DIAGNOSIS — H47011 Ischemic optic neuropathy, right eye: Secondary | ICD-10-CM | POA: Diagnosis not present

## 2020-11-16 DIAGNOSIS — Z961 Presence of intraocular lens: Secondary | ICD-10-CM | POA: Diagnosis not present

## 2020-11-16 DIAGNOSIS — H47012 Ischemic optic neuropathy, left eye: Secondary | ICD-10-CM | POA: Diagnosis not present

## 2020-11-16 DIAGNOSIS — H353132 Nonexudative age-related macular degeneration, bilateral, intermediate dry stage: Secondary | ICD-10-CM | POA: Diagnosis not present

## 2020-11-16 DIAGNOSIS — H4720 Unspecified optic atrophy: Secondary | ICD-10-CM | POA: Diagnosis not present

## 2020-11-16 DIAGNOSIS — H401123 Primary open-angle glaucoma, left eye, severe stage: Secondary | ICD-10-CM | POA: Diagnosis not present

## 2020-11-16 DIAGNOSIS — H2511 Age-related nuclear cataract, right eye: Secondary | ICD-10-CM | POA: Diagnosis not present

## 2020-11-24 DIAGNOSIS — M1712 Unilateral primary osteoarthritis, left knee: Secondary | ICD-10-CM | POA: Diagnosis not present

## 2020-12-01 DIAGNOSIS — M1712 Unilateral primary osteoarthritis, left knee: Secondary | ICD-10-CM | POA: Diagnosis not present

## 2020-12-03 DIAGNOSIS — I420 Dilated cardiomyopathy: Secondary | ICD-10-CM | POA: Diagnosis not present

## 2020-12-03 DIAGNOSIS — Z1389 Encounter for screening for other disorder: Secondary | ICD-10-CM | POA: Diagnosis not present

## 2020-12-03 DIAGNOSIS — Z0000 Encounter for general adult medical examination without abnormal findings: Secondary | ICD-10-CM | POA: Diagnosis not present

## 2020-12-03 DIAGNOSIS — Y712 Prosthetic and other implants, materials and accessory cardiovascular devices associated with adverse incidents: Secondary | ICD-10-CM | POA: Diagnosis not present

## 2020-12-03 DIAGNOSIS — C801 Malignant (primary) neoplasm, unspecified: Secondary | ICD-10-CM | POA: Diagnosis not present

## 2020-12-03 DIAGNOSIS — N183 Chronic kidney disease, stage 3 unspecified: Secondary | ICD-10-CM | POA: Diagnosis not present

## 2020-12-03 DIAGNOSIS — K219 Gastro-esophageal reflux disease without esophagitis: Secondary | ICD-10-CM | POA: Diagnosis not present

## 2020-12-03 DIAGNOSIS — I421 Obstructive hypertrophic cardiomyopathy: Secondary | ICD-10-CM | POA: Diagnosis not present

## 2020-12-03 DIAGNOSIS — R7303 Prediabetes: Secondary | ICD-10-CM | POA: Diagnosis not present

## 2020-12-03 DIAGNOSIS — E669 Obesity, unspecified: Secondary | ICD-10-CM | POA: Diagnosis not present

## 2020-12-03 DIAGNOSIS — I4891 Unspecified atrial fibrillation: Secondary | ICD-10-CM | POA: Diagnosis not present

## 2020-12-03 DIAGNOSIS — E7800 Pure hypercholesterolemia, unspecified: Secondary | ICD-10-CM | POA: Diagnosis not present

## 2020-12-08 DIAGNOSIS — M1712 Unilateral primary osteoarthritis, left knee: Secondary | ICD-10-CM | POA: Diagnosis not present

## 2021-01-15 DIAGNOSIS — Z20822 Contact with and (suspected) exposure to covid-19: Secondary | ICD-10-CM | POA: Diagnosis not present

## 2021-01-25 DIAGNOSIS — Z20822 Contact with and (suspected) exposure to covid-19: Secondary | ICD-10-CM | POA: Diagnosis not present

## 2021-01-25 DIAGNOSIS — J22 Unspecified acute lower respiratory infection: Secondary | ICD-10-CM | POA: Diagnosis not present

## 2021-01-25 DIAGNOSIS — R059 Cough, unspecified: Secondary | ICD-10-CM | POA: Diagnosis not present

## 2021-02-03 ENCOUNTER — Ambulatory Visit
Admission: RE | Admit: 2021-02-03 | Discharge: 2021-02-03 | Disposition: A | Discharge status: Home / Self Care | Payer: Medicare Other | Source: Ambulatory Visit | Attending: Internal Medicine | Admitting: Internal Medicine

## 2021-02-03 ENCOUNTER — Other Ambulatory Visit: Payer: Self-pay | Admitting: Internal Medicine

## 2021-02-03 DIAGNOSIS — R059 Cough, unspecified: Secondary | ICD-10-CM | POA: Diagnosis not present

## 2021-02-03 DIAGNOSIS — R062 Wheezing: Secondary | ICD-10-CM | POA: Diagnosis not present

## 2021-02-03 DIAGNOSIS — M79672 Pain in left foot: Secondary | ICD-10-CM | POA: Diagnosis not present

## 2021-02-22 ENCOUNTER — Other Ambulatory Visit: Payer: Self-pay | Admitting: Cardiology

## 2021-02-22 NOTE — Telephone Encounter (Signed)
Prescription refill request for Xarelto received.   Indication: afib  Last office visit: 04/12/2020, [place] Weight: 88 kg  Age: 87 Scr: 1.67, 12/03/2020 CrCl: 35 ml/min   Refill sent.

## 2021-03-06 NOTE — H&P (View-Only) (Signed)
Melinda Gonzales Date of Birth: Dec 24, 1934   History of Present Illness: [name] is seen for follow up of HOCM  She was evaluated in 2011 with a Myoview stress test which was normal. She had an Echo at that time that showed moderate LVH with EF 65-70%. Mild MR. She does have a history of murmur. She is not a smoker. In early 2016 she presented with increased dyspnea and an Echo and cardiac MRI were done. These with both consistent with HOCM. LVOT gradient of 112 mm Hg at rest. She was started on low dose lasix and Toprol XL with good response initially with improved dyspnea.  Seen last year with symptoms of increased dyspnea. No increased edema or orthopnea but more dyspnea with walking or lifting. No chest pain. No dizziness or palpitations. Energy level has decreased. Repeat Echo showed severe LVOT gradient and MR.  She underwent cardiac cath with findings of LVOT gradient and MR. No significant CAD. Pulmonary HTN. She was referred to the [facility] clinic and underwent surgery on 11/05/15. Prior to surgery she developed Afib and had a DCCV. Surgery  included a septal myectomy, MVR with a #29 mm [employer]  Biocor valve, left sided Cryo MAZE, and ligation of the left atrial appendage. Her post op course was remarkable for paroxysmal atrial fibrillation and she was started on amiodarone and Xarelto.  Ecg showed a LBBB. Repeat Echo on 11/11/15 showed normal LV function with EF 60%. Mean MV gradient 8 mm Hg. Trace MR. Mild TR. There was a fixed LVOT gradient of 32 mm Hg peak and mean of 16 mm Hg. Repeat Echo 02/03/16 showed no LVOT gradient and normal MV prosthesis function.   She was seen in September 2020 with symptoms of persistent tachycardia and SOB. HR in the 120s. Echo was obtained and showed significant decline in LV function with EF 25-30%. After review of Ecg tracings and reviewing with EP it appeared she was in Atrial flutter. BNP was 467. TSH was mildly elevated 6.0. She was started on  Coreg. Lasix was increased she was on Xarelto. She underwent  DCCV on 10/23/18. Subsequent Echo in December showed EF had returned to normal.   On follow up today she is doing very well.  She denies any dyspnea. No edema. No chest pain. Feels a little lightheaded and nauseated in the am but gets better after she eats.     Current Outpatient Medications on File Prior to Visit  Medication Sig Dispense Refill   carvedilol (COREG) 6.25 MG tablet TAKE 1 TABLET BY MOUTH TWICE A DAY 60 tablet 6   ENTRESTO 24-26 MG TAKE 1 TABLET BY MOUTH TWICE A DAY 60 tablet 10   furosemide (LASIX) 40 MG tablet TAKE 1 TABLET BY MOUTH DAILY 30 tablet 8   potassium chloride SA (KLOR-CON) 20 MEQ tablet TAKE 1 TABLET BY MOUTH DAILY 30 tablet 6   Rivaroxaban (XARELTO) 15 MG TABS tablet TAKE 1 TABLET BY MOUTH DAILY WITH SUPPER 30 tablet 5   rosuvastatin (CRESTOR) 10 MG tablet TAKE 1 TABLET (10 MG TOTAL) BY MOUTH DAILY. 30 tablet 6   No current facility-administered medications on file prior to visit.    Allergies  Allergen Reactions   Oxycodone Nausea And Vomiting    And the sweats    Past Medical History:  Diagnosis Date   Chronic diastolic CHF (congestive heart failure) ([place]) 07/06/2015   Dyspnea    Heart murmur    Hypercholesterolemia    Hypertrophic  obstructive cardiomyopathy (HOCM) ([facility])    LVH (left ventricular hypertrophy)    WITH NORMAL SYSTOLIC FUNCTION   SOB (shortness of breath)     Past Surgical History:  Procedure Laterality Date   CARDIAC CATHETERIZATION N/A 07/06/2015   Procedure: Right/Left Heart Cath and Coronary Angiography;  Surgeon: Zoii Florer M Martinique, MD;  Location: [place] [facility];  Service: Cardiovascular;  Laterality: N/A;   CARDIOVERSION N/A 10/23/2018   Procedure: CARDIOVERSION;  Surgeon: Donato Heinz, MD;  Location: [facility] ENDOSCOPY;  Service: Endoscopy;  Laterality: N/A;   MITRAL VALVE REPLACEMENT N/A 11/15/2015   ligation of left atrial appendage, maze procedure at [place] History   Tobacco Use  Smoking Status Former   Types: Cigarettes   Quit date: 05/26/1965   Years since quitting: 55.8  Smokeless Tobacco Never    Social History   Substance and Sexual Activity  Alcohol Use No    Family History  Problem Relation Age of Onset   Heart failure Mother 33   Heart attack Father 65    Review of Systems: As noted in HPI.   All other systems were reviewed and are negative.  Physical Exam: BP 112/80 (BP Location: Left Arm, Patient Position: Sitting, Cuff Size: Normal)    Pulse (!) 103    Ht 5\' 2"  (1.575 m)    Wt 183 lb (83 kg)    BMI 33.47 kg/m   Body mass index is 33.47 kg/m. GENERAL:  Well appearing obese WF in NAD HEENT:  PERRL, EOMI, sclera are clear. Oropharynx is clear. NECK:  No jugular venous distention, carotid upstroke brisk and symmetric, no bruits, no thyromegaly or adenopathy LUNGS:  Clear to auscultation bilaterally CHEST:  Unremarkable HEART:  IRRR,  PMI not displaced or sustained,S1 and S2 within normal limits, no S3, no S4: no clicks, no rubs, gr 1/6 systolic murmur at the apex>> RUSB ABD:  Soft, nontender. BS +, no masses or bruits. No hepatomegaly, no splenomegaly EXT:  2 + pulses throughout, no edema, no cyanosis no clubbing SKIN:  Warm and dry.  No rashes NEURO:  Alert and oriented x 3. Cranial nerves II through XII intact. PSYCH:  Cognitively intact    LABORATORY DATA:   Lab Results  Component Value Date   WBC 8.6 10/23/2018   HGB 13.4 10/23/2018   HCT 40.5 10/23/2018   PLT 230 10/23/2018   GLUCOSE 145 (H) 11/01/2018   CHOL 196 02/24/2016   TRIG 115 02/24/2016   HDL 58 02/24/2016   LDLCALC 115 (H) 02/24/2016   ALT 14 11/19/2015   AST 19 11/19/2015   NA 142 11/01/2018   K 4.5 11/01/2018   CL 104 11/01/2018   CREATININE 1.43 (H) 11/01/2018   BUN 23 11/01/2018   CO2 21 11/01/2018   TSH 4.520 (H) 11/01/2018   INR 1.03 07/06/2015   Labs dated 03/11/16: creatinine 1.6 Dated 07/27/16:  cholesterol 192, triglycerides 165, HDL 52, LDL 107. Creatinine 1.27. Potassium and ALT normal.  Dated 02/07/17: BUN 27, creatinine 1.25. Glucose 113. Other chemistries normal. Cholesterol 199, triglycerides 212, HDL 44, LDL 113.  Dated 02/08/18: cholesterol 161, triglycerides 209, HDL 44, LDL 75. LFTs normal. Dated 10/13/19: A1c 6.7%. triglycerides 269, HDL 39. Unable to see other results in [place] Dated 01/13/20: BUN 33, creatinine 1.76. glucose 137. Potassium 5.1. sodium 144. A1c 6.4% Dated 12/03/20: cholesterol 151, triglycerides 185, HDL 42, LDL 78. A1c 6.5%. BUN 37, creatinine 1.67. otherwise CMET normal.  Ecg today shows  atrial flutter with 2:1 AV conduction. Rate 103. LBBB. I have personally reviewed and interpreted this study.   Echo 02/03/16: Study Conclusions   - Left ventricle: The cavity size was normal. There was moderate   concentric hypertrophy. Systolic function was normal. The   estimated ejection fraction was in the range of 60% to 65%. Wall   motion was normal; there were no regional wall motion   abnormalities. The study is not technically sufficient to allow   evaluation of LV diastolic function due to the presence of a   bioprosthetic mitral valve. - Aortic valve: Valve mobility was mildly restricted. There was   mild stenosis. There was mild regurgitation. - Mitral valve: Mildly calcified annulus. A 39mm [employer] Jude biocor   bioprosthesis was present and functioning well. - Left atrium: The atrium was severely dilated. - Right ventricle: The cavity size was normal. Wall thickness was   normal. Systolic function was normal. - Tricuspid valve: There was mild regurgitation. - Pulmonary arteries: Systolic pressure was mildly increased. PA   peak pressure: 42 mm Hg (S).   Impressions:   - Since the last echo 06/15/2015, a bioprosthetic mitral valve is   present and functioning well. Septal wall thickness is now 1.5 cm   compared with 1.8 cm pre-operatively.  Echo 04/19/17:  Study Conclusions   - Valve surgery. Mitral valve replacement with a [employer]   porcine valve. 17mm Biocor bioprosthetic valve. - Left ventricle: The cavity size was normal. Wall thickness was   increased in a pattern of mild LVH. Systolic function was normal.   The estimated ejection fraction was in the range of 55% to 60%.   Wall motion was normal; there were no regional wall motion   abnormalities. Doppler parameters are consistent with a   reversible restrictive pattern, indicative of decreased left   ventricular diastolic compliance and/or increased left atrial   pressure (grade 3 diastolic dysfunction). - Aortic valve: There was trivial regurgitation. Mean gradient (S):   11 mm Hg. Peak gradient (S): 19 mm Hg. - Mitral valve: A bioprosthesis was present. The prosthesis had a   normal range of motion. Mean gradient (D): 5 mm Hg. - Left atrium: The atrium was mildly dilated. - Pulmonary arteries: Systolic pressure was moderately increased.   PA peak pressure: 56 mm Hg (S).  Echocardiogram 10/15/2018: 1. The left ventricle has severely reduced systolic function, with an ejection fraction of 25-30%. The cavity size was normal. Left ventricular diastolic function could not be evaluated. Elevated mean left atrial pressure Left ventricular diffuse  hypokinesis.  2. The right ventricle has moderately reduced systolic function. The cavity was normal. Right ventricular systolic pressure is mildly elevated with an estimated pressure of 41.8 mmHg.  3. Left atrial size was moderately dilated.  4. The tricuspid valve is grossly normal. Tricuspid valve regurgitation is moderate.  5. The aortic valve has an indeterminate number of cusps. Mild calcification of the aortic valve. Aortic valve regurgitation is trivial by color flow Doppler. No stenosis of the aortic valve.  6. The aorta is normal unless otherwise noted.  7. The inferior vena cava was dilated in size with >50% respiratory  variability.  8. Septal dyssynergy and global hypokinesis; overall severe LV dysfunction; trace AI; s/p MVR with no MR; moderate LAE; moderate RV dysfunction; moderate TR and mild pulmonary hypertension.  Echo: 01/13/19: IMPRESSIONS     1. Left ventricular ejection fraction, by visual estimation, is 60 to  65%. The left  ventricle has normal function. There is severely increased  left ventricular hypertrophy.   2. Left ventricular diastolic function could not be evaluated.   3. The left ventricle has no regional wall motion abnormalities.   4. Global right ventricle has normal systolic function.The right  ventricular size is normal. No increase in right ventricular wall  thickness.   5. Left atrial size was normal.   6. Right atrial size was normal.   7. The mitral valve has been repaired/replaced. No evidence of mitral  valve regurgitation.   8. The tricuspid valve is grossly normal. Tricuspid valve regurgitation  is trivial.   9. The aortic valve is abnormal. Aortic valve regurgitation is mild. Mild  aortic valve sclerosis without stenosis.  10. Pulmonic regurgitation is mild.  11. The pulmonic valve was normal in structure. Pulmonic valve  regurgitation is mild.  12. The atrial septum is grossly normal.    Assessment / Plan: 1. HOCM.  s/p septal myectomy on 11/05/15. Excellent result by follow up Echo. No significant residual LVOT gradient.   2. Severe MR s/p MVR with #29 [employer] Biocor valve. Routine SBE prophylaxis.   3.  Chronic systolic CHF. LV dysfunction associated in the past with atrial flutter. EF 25-30%. Likely tachycardia mediated with new Atrial flutter with RVR. After DCCV EF  normalized based on Echo in December 2020. On Coreg, lasix and  Entresto 24/26 mg bid.  Will update Echo now especially since she is in [place] today.  4. Paroxysmal Atrial fibrillation/flutter.  post op open heart surgery. S/p left sided MAZE. Seen in September 2020 with  new onset Atrial  flutter.  DCCV on 10/23/18. Had been on amiodarone post op heart surgery. Patient questions if this was the cause of her optic nerve problem. Has been on Xarelto long term and hasn't missed any doses. Recommend DCCV to restore NSR. If AFlutter recurs will need to consider AAD therapy.  5. Hypercholesterolemia. On low dose Crestor. No history of CAD.  6. CKD stage 2-3.   7. Obesity. Discussed weight loss strategy of limiting Etoh intake and restricting carbohydrate intake.     I will follow up post DCCV

## 2021-03-06 NOTE — Progress Notes (Signed)
Melinda Gonzales Date of Birth: 1934/12/27   History of Present Illness: [name] is seen for follow up of HOCM  She was evaluated in 2011 with a Myoview stress test which was normal. She had an Echo at that time that showed moderate LVH with EF 65-70%. Mild MR. She does have a history of murmur. She is not a smoker. In early 2016 she presented with increased dyspnea and an Echo and cardiac MRI were done. These with both consistent with HOCM. LVOT gradient of 112 mm Hg at rest. She was started on low dose lasix and Toprol XL with good response initially with improved dyspnea.  Seen last year with symptoms of increased dyspnea. No increased edema or orthopnea but more dyspnea with walking or lifting. No chest pain. No dizziness or palpitations. Energy level has decreased. Repeat Echo showed severe LVOT gradient and MR.  She underwent cardiac cath with findings of LVOT gradient and MR. No significant CAD. Pulmonary HTN. She was referred to the [facility] clinic and underwent surgery on 11/05/15. Prior to surgery she developed Afib and had a DCCV. Surgery  included a septal myectomy, MVR with a #29 mm St. Jude  Biocor valve, left sided Cryo MAZE, and ligation of the left atrial appendage. Her post op course was remarkable for paroxysmal atrial fibrillation and she was started on amiodarone and Xarelto.  Ecg showed a LBBB. Repeat Echo on 11/11/15 showed normal LV function with EF 60%. Mean MV gradient 8 mm Hg. Trace MR. Mild TR. There was a fixed LVOT gradient of 32 mm Hg peak and mean of 16 mm Hg. Repeat Echo 02/03/16 showed no LVOT gradient and normal MV prosthesis function.   She was seen in September 2020 with symptoms of persistent tachycardia and SOB. HR in the 120s. Echo was obtained and showed significant decline in LV function with EF 25-30%. After review of Ecg tracings and reviewing with EP it appeared she was in Atrial flutter. BNP was 467. TSH was mildly elevated 6.0. She was started on  Coreg. Lasix was increased she was on Xarelto. She underwent  DCCV on 10/23/18. Subsequent Echo in December showed EF had returned to normal.   On follow up today she is doing very well.  She denies any dyspnea. No edema. No chest pain. Feels a little lightheaded and nauseated in the am but gets better after she eats.     Current Outpatient Medications on File Prior to Visit  Medication Sig Dispense Refill   carvedilol (COREG) 6.25 MG tablet TAKE 1 TABLET BY MOUTH TWICE A DAY 60 tablet 6   ENTRESTO 24-26 MG TAKE 1 TABLET BY MOUTH TWICE A DAY 60 tablet 10   furosemide (LASIX) 40 MG tablet TAKE 1 TABLET BY MOUTH DAILY 30 tablet 8   potassium chloride SA (KLOR-CON) 20 MEQ tablet TAKE 1 TABLET BY MOUTH DAILY 30 tablet 6   Rivaroxaban (XARELTO) 15 MG TABS tablet TAKE 1 TABLET BY MOUTH DAILY WITH SUPPER 30 tablet 5   rosuvastatin (CRESTOR) 10 MG tablet TAKE 1 TABLET (10 MG TOTAL) BY MOUTH DAILY. 30 tablet 6   No current facility-administered medications on file prior to visit.    Allergies  Allergen Reactions   Oxycodone Nausea And Vomiting    And the sweats    Past Medical History:  Diagnosis Date   Chronic diastolic CHF (congestive heart failure) ([place]) 07/06/2015   Dyspnea    Heart murmur    Hypercholesterolemia    Hypertrophic  obstructive cardiomyopathy (HOCM) ([facility])    LVH (left ventricular hypertrophy)    WITH NORMAL SYSTOLIC FUNCTION   SOB (shortness of breath)     Past Surgical History:  Procedure Laterality Date   CARDIAC CATHETERIZATION N/A 07/06/2015   Procedure: Right/Left Heart Cath and Coronary Angiography;  Surgeon: Vidya Bamford M Martinique, MD;  Location: [facility];  Service: Cardiovascular;  Laterality: N/A;   CARDIOVERSION N/A 10/23/2018   Procedure: CARDIOVERSION;  Surgeon: Donato Heinz, MD;  Location: [facility] ENDOSCOPY;  Service: Endoscopy;  Laterality: N/A;   MITRAL VALVE REPLACEMENT N/A 11/15/2015   ligation of left atrial appendage, maze procedure at [place] History   Tobacco Use  Smoking Status Former   Types: Cigarettes   Quit date: 05/26/1965   Years since quitting: 55.8  Smokeless Tobacco Never    Social History   Substance and Sexual Activity  Alcohol Use No    Family History  Problem Relation Age of Onset   Heart failure Mother 70   Heart attack Father 43    Review of Systems: As noted in HPI.   All other systems were reviewed and are negative.  Physical Exam: BP 112/80 (BP Location: Left Arm, Patient Position: Sitting, Cuff Size: Normal)    Pulse (!) 103    Ht 5\' 2"  (1.575 m)    Wt 183 lb (83 kg)    BMI 33.47 kg/m   Body mass index is 33.47 kg/m. GENERAL:  Well appearing obese WF in NAD HEENT:  PERRL, EOMI, sclera are clear. Oropharynx is clear. NECK:  No jugular venous distention, carotid upstroke brisk and symmetric, no bruits, no thyromegaly or adenopathy LUNGS:  Clear to auscultation bilaterally CHEST:  Unremarkable HEART:  IRRR,  PMI not displaced or sustained,S1 and S2 within normal limits, no S3, no S4: no clicks, no rubs, gr 1/6 systolic murmur at the apex>> RUSB ABD:  Soft, nontender. BS +, no masses or bruits. No hepatomegaly, no splenomegaly EXT:  2 + pulses throughout, no edema, no cyanosis no clubbing SKIN:  Warm and dry.  No rashes NEURO:  Alert and oriented x 3. Cranial nerves II through XII intact. PSYCH:  Cognitively intact    LABORATORY DATA:   Lab Results  Component Value Date   WBC 8.6 10/23/2018   HGB 13.4 10/23/2018   HCT 40.5 10/23/2018   PLT 230 10/23/2018   GLUCOSE 145 (H) 11/01/2018   CHOL 196 02/24/2016   TRIG 115 02/24/2016   HDL 58 02/24/2016   LDLCALC 115 (H) 02/24/2016   ALT 14 11/19/2015   AST 19 11/19/2015   NA 142 11/01/2018   K 4.5 11/01/2018   CL 104 11/01/2018   CREATININE 1.43 (H) 11/01/2018   BUN 23 11/01/2018   CO2 21 11/01/2018   TSH 4.520 (H) 11/01/2018   INR 1.03 07/06/2015   Labs dated 03/11/16: creatinine 1.6 Dated 07/27/16:  cholesterol 192, triglycerides 165, HDL 52, LDL 107. Creatinine 1.27. Potassium and ALT normal.  Dated 02/07/17: BUN 27, creatinine 1.25. Glucose 113. Other chemistries normal. Cholesterol 199, triglycerides 212, HDL 44, LDL 113.  Dated 02/08/18: cholesterol 161, triglycerides 209, HDL 44, LDL 75. LFTs normal. Dated 10/13/19: A1c 6.7%. triglycerides 269, HDL 39. Unable to see other results in [place] Dated 01/13/20: BUN 33, creatinine 1.76. glucose 137. Potassium 5.1. sodium 144. A1c 6.4% Dated 12/03/20: cholesterol 151, triglycerides 185, HDL 42, LDL 78. A1c 6.5%. BUN 37, creatinine 1.67. otherwise CMET normal.  Ecg today shows  atrial flutter with 2:1 AV conduction. Rate 103. LBBB. I have personally reviewed and interpreted this study.   Echo 02/03/16: Study Conclusions   - Left ventricle: The cavity size was normal. There was moderate   concentric hypertrophy. Systolic function was normal. The   estimated ejection fraction was in the range of 60% to 65%. Wall   motion was normal; there were no regional wall motion   abnormalities. The study is not technically sufficient to allow   evaluation of LV diastolic function due to the presence of a   bioprosthetic mitral valve. - Aortic valve: Valve mobility was mildly restricted. There was   mild stenosis. There was mild regurgitation. - Mitral valve: Mildly calcified annulus. A 97mm St. Jude biocor   bioprosthesis was present and functioning well. - Left atrium: The atrium was severely dilated. - Right ventricle: The cavity size was normal. Wall thickness was   normal. Systolic function was normal. - Tricuspid valve: There was mild regurgitation. - Pulmonary arteries: Systolic pressure was mildly increased. PA   peak pressure: 42 mm Hg (S).   Impressions:   - Since the last echo 06/15/2015, a bioprosthetic mitral valve is   present and functioning well. Septal wall thickness is now 1.5 cm   compared with 1.8 cm pre-operatively.  Echo 04/19/17:  Study Conclusions   - Valve surgery. Mitral valve replacement with a [employer]   porcine valve. 67mm Biocor bioprosthetic valve. - Left ventricle: The cavity size was normal. Wall thickness was   increased in a pattern of mild LVH. Systolic function was normal.   The estimated ejection fraction was in the range of 55% to 60%.   Wall motion was normal; there were no regional wall motion   abnormalities. Doppler parameters are consistent with a   reversible restrictive pattern, indicative of decreased left   ventricular diastolic compliance and/or increased left atrial   pressure (grade 3 diastolic dysfunction). - Aortic valve: There was trivial regurgitation. Mean gradient (S):   11 mm Hg. Peak gradient (S): 19 mm Hg. - Mitral valve: A bioprosthesis was present. The prosthesis had a   normal range of motion. Mean gradient (D): 5 mm Hg. - Left atrium: The atrium was mildly dilated. - Pulmonary arteries: Systolic pressure was moderately increased.   PA peak pressure: 56 mm Hg (S).  Echocardiogram 10/15/2018: 1. The left ventricle has severely reduced systolic function, with an ejection fraction of 25-30%. The cavity size was normal. Left ventricular diastolic function could not be evaluated. Elevated mean left atrial pressure Left ventricular diffuse  hypokinesis.  2. The right ventricle has moderately reduced systolic function. The cavity was normal. Right ventricular systolic pressure is mildly elevated with an estimated pressure of 41.8 mmHg.  3. Left atrial size was moderately dilated.  4. The tricuspid valve is grossly normal. Tricuspid valve regurgitation is moderate.  5. The aortic valve has an indeterminate number of cusps. Mild calcification of the aortic valve. Aortic valve regurgitation is trivial by color flow Doppler. No stenosis of the aortic valve.  6. The aorta is normal unless otherwise noted.  7. The inferior vena cava was dilated in size with >50% respiratory  variability.  8. Septal dyssynergy and global hypokinesis; overall severe LV dysfunction; trace AI; s/p MVR with no MR; moderate LAE; moderate RV dysfunction; moderate TR and mild pulmonary hypertension.  Echo: 01/13/19: IMPRESSIONS     1. Left ventricular ejection fraction, by visual estimation, is 60 to  65%. The left  ventricle has normal function. There is severely increased  left ventricular hypertrophy.   2. Left ventricular diastolic function could not be evaluated.   3. The left ventricle has no regional wall motion abnormalities.   4. Global right ventricle has normal systolic function.The right  ventricular size is normal. No increase in right ventricular wall  thickness.   5. Left atrial size was normal.   6. Right atrial size was normal.   7. The mitral valve has been repaired/replaced. No evidence of mitral  valve regurgitation.   8. The tricuspid valve is grossly normal. Tricuspid valve regurgitation  is trivial.   9. The aortic valve is abnormal. Aortic valve regurgitation is mild. Mild  aortic valve sclerosis without stenosis.  10. Pulmonic regurgitation is mild.  11. The pulmonic valve was normal in structure. Pulmonic valve  regurgitation is mild.  12. The atrial septum is grossly normal.    Assessment / Plan: 1. HOCM.  s/p septal myectomy on 11/05/15. Excellent result by follow up Echo. No significant residual LVOT gradient.   2. Severe MR s/p MVR with #29 St. Jude Biocor valve. Routine SBE prophylaxis.   3.  Chronic systolic CHF. LV dysfunction associated in the past with atrial flutter. EF 25-30%. Likely tachycardia mediated with new Atrial flutter with RVR. After DCCV EF  normalized based on Echo in December 2020. On Coreg, lasix and  Entresto 24/26 mg bid.  Will update Echo now especially since she is in [place] today.  4. Paroxysmal Atrial fibrillation/flutter.  post op open heart surgery. S/p left sided MAZE. Seen in September 2020 with  new onset Atrial  flutter.  DCCV on 10/23/18. Had been on amiodarone post op heart surgery. Patient questions if this was the cause of her optic nerve problem. Has been on Xarelto long term and hasn't missed any doses. Recommend DCCV to restore NSR. If AFlutter recurs will need to consider AAD therapy.  5. Hypercholesterolemia. On low dose Crestor. No history of CAD.  6. CKD stage 2-3.   7. Obesity. Discussed weight loss strategy of limiting Etoh intake and restricting carbohydrate intake.     I will follow up post DCCV

## 2021-03-10 ENCOUNTER — Other Ambulatory Visit: Payer: Self-pay | Admitting: Cardiology

## 2021-03-10 ENCOUNTER — Ambulatory Visit (INDEPENDENT_AMBULATORY_CARE_PROVIDER_SITE_OTHER): Payer: Medicare Other | Admitting: Cardiology

## 2021-03-10 ENCOUNTER — Other Ambulatory Visit: Payer: Self-pay

## 2021-03-10 ENCOUNTER — Encounter: Payer: Self-pay | Admitting: Cardiology

## 2021-03-10 VITALS — BP 112/80 | HR 103 | Ht 62.0 in | Wt 183.0 lb

## 2021-03-10 DIAGNOSIS — I484 Atypical atrial flutter: Secondary | ICD-10-CM | POA: Diagnosis not present

## 2021-03-10 DIAGNOSIS — I421 Obstructive hypertrophic cardiomyopathy: Secondary | ICD-10-CM

## 2021-03-10 DIAGNOSIS — Z952 Presence of prosthetic heart valve: Secondary | ICD-10-CM

## 2021-03-10 DIAGNOSIS — I5032 Chronic diastolic (congestive) heart failure: Secondary | ICD-10-CM | POA: Diagnosis not present

## 2021-03-10 DIAGNOSIS — N1831 Chronic kidney disease, stage 3a: Secondary | ICD-10-CM

## 2021-03-10 NOTE — Patient Instructions (Signed)
Medication Instructions:  Continue same medications *If you need a refill on your cardiac medications before your next appointment, please call your pharmacy*   Lab Work: Have bmet,lipid and hepatic panels,tsh,cbc done Tues 03/22/21  at [place] office Lab order enclosed   Testing/Procedures: Schedule  Echo  Schedule Cardioversion   Follow instructions below   Follow-Up: At [facility], you and your health needs are our priority.  As part of our continuing mission to provide you with exceptional heart care, we have created designated Provider Care Teams.  These Care Teams include your primary Cardiologist (physician) and Advanced Practice Providers (APPs -  Physician Assistants and Nurse Practitioners) who all work together to provide you with the care you need, when you need it.  We recommend signing up for the patient portal called "MyChart".  Sign up information is provided on this After Visit Summary.  MyChart is used to connect with patients for Virtual Visits (Telemedicine).  Patients are able to view lab/test results, encounter notes, upcoming appointments, etc.  Non-urgent messages can be sent to your provider as well.   To learn more about what you can do with MyChart, go to [email or web address].    Your next appointment:      The format for your next appointment: Office   Provider:  Dr.Jordan     You are scheduled for a Cardioversion on Tuesday 03/29/21 with Dr. Sallyanne Kuster.  Please arrive at the [facility] (Main Entrance A) at [facility]: [address] [place], [place] [address] at 10:00 am.   DIET: Nothing to eat or drink after midnight except a sip of water with medications (see medication instructions below)  FYI: For your safety, and to allow [place] to monitor your vital signs accurately during the surgery/procedure we request that   if you have artificial nails, gel coating, SNS etc. Please have those removed prior to your surgery/procedure. Not having  the nail coverings /polish removed may result in cancellation or delay of your surgery/procedure.   Medication Instructions:   Continue your anticoagulant: Xarelto    Labs: Have cbc,tsh,bmet,lipid and hepatic panels done Tuesday 03/22/21 at [place] office Lab order enclosed  You must have a responsible person to drive you home and stay in the waiting area during your procedure. Failure to do so could result in cancellation.  Bring your insurance cards.  *Special Note: Every effort is made to have your procedure done on time. Occasionally there are emergencies that occur at the hospital that may cause delays. Please be patient if a delay does occur.

## 2021-03-21 ENCOUNTER — Encounter (HOSPITAL_COMMUNITY): Payer: Self-pay | Admitting: Cardiovascular Disease

## 2021-03-22 DIAGNOSIS — Z952 Presence of prosthetic heart valve: Secondary | ICD-10-CM | POA: Diagnosis not present

## 2021-03-22 DIAGNOSIS — I484 Atypical atrial flutter: Secondary | ICD-10-CM | POA: Diagnosis not present

## 2021-03-22 DIAGNOSIS — N1831 Chronic kidney disease, stage 3a: Secondary | ICD-10-CM | POA: Diagnosis not present

## 2021-03-22 DIAGNOSIS — I5032 Chronic diastolic (congestive) heart failure: Secondary | ICD-10-CM | POA: Diagnosis not present

## 2021-03-22 DIAGNOSIS — I421 Obstructive hypertrophic cardiomyopathy: Secondary | ICD-10-CM | POA: Diagnosis not present

## 2021-03-22 LAB — CBC WITH DIFFERENTIAL/PLATELET
Basophils Absolute: 0 10*3/uL (ref 0.0–0.2)
Basos: 0 %
EOS (ABSOLUTE): 0.1 10*3/uL (ref 0.0–0.4)
Eos: 2 %
Hematocrit: 39.3 % (ref 34.0–46.6)
Hemoglobin: 12.9 g/dL (ref 11.1–15.9)
Immature Grans (Abs): 0 10*3/uL (ref 0.0–0.1)
Immature Granulocytes: 1 %
Lymphocytes Absolute: 1.4 10*3/uL (ref 0.7–3.1)
Lymphs: 19 %
MCH: 31.7 pg (ref 26.6–33.0)
MCHC: 32.8 g/dL (ref 31.5–35.7)
MCV: 97 fL (ref 79–97)
Monocytes Absolute: 0.7 10*3/uL (ref 0.1–0.9)
Monocytes: 9 %
Neutrophils Absolute: 5.2 10*3/uL (ref 1.4–7.0)
Neutrophils: 69 %
Platelets: 184 10*3/uL (ref 150–450)
RBC: 4.07 x10E6/uL (ref 3.77–5.28)
RDW: 13.6 % (ref 11.7–15.4)
WBC: 7.4 10*3/uL (ref 3.4–10.8)

## 2021-03-22 LAB — HEPATIC FUNCTION PANEL
ALT: 23 IU/L (ref 0–32)
AST: 19 IU/L (ref 0–40)
Albumin: 4.1 g/dL (ref 3.6–4.6)
Alkaline Phosphatase: 83 IU/L (ref 44–121)
Bilirubin Total: 0.3 mg/dL (ref 0.0–1.2)
Bilirubin, Direct: 0.11 mg/dL (ref 0.00–0.40)
Total Protein: 6.3 g/dL (ref 6.0–8.5)

## 2021-03-22 LAB — BASIC METABOLIC PANEL
BUN/Creatinine Ratio: 21 (ref 12–28)
BUN: 35 mg/dL — ABNORMAL HIGH (ref 8–27)
CO2: 21 mmol/L (ref 20–29)
Calcium: 8.9 mg/dL (ref 8.7–10.3)
Chloride: 105 mmol/L (ref 96–106)
Creatinine, Ser: 1.68 mg/dL — ABNORMAL HIGH (ref 0.57–1.00)
Glucose: 130 mg/dL — ABNORMAL HIGH (ref 70–99)
Potassium: 4.8 mmol/L (ref 3.5–5.2)
Sodium: 139 mmol/L (ref 134–144)
eGFR: 30 mL/min/{1.73_m2} — ABNORMAL LOW (ref >59)

## 2021-03-22 LAB — LIPID PANEL
Chol/HDL Ratio: 3.2 ratio (ref 0.0–4.4)
Cholesterol, Total: 145 mg/dL (ref 100–199)
HDL: 46 mg/dL (ref >39)
LDL Chol Calc (NIH): 70 mg/dL (ref 0–99)
Triglycerides: 172 mg/dL — ABNORMAL HIGH (ref 0–149)
VLDL Cholesterol Cal: 29 mg/dL (ref 5–40)

## 2021-03-22 LAB — TSH: TSH: 7.73 u[IU]/mL — ABNORMAL HIGH (ref 0.450–4.500)

## 2021-03-25 ENCOUNTER — Other Ambulatory Visit: Payer: Self-pay

## 2021-03-25 ENCOUNTER — Ambulatory Visit (HOSPITAL_COMMUNITY): Payer: Medicare Other | Attending: Cardiology

## 2021-03-25 DIAGNOSIS — I5032 Chronic diastolic (congestive) heart failure: Secondary | ICD-10-CM | POA: Diagnosis not present

## 2021-03-25 DIAGNOSIS — I484 Atypical atrial flutter: Secondary | ICD-10-CM | POA: Insufficient documentation

## 2021-03-25 DIAGNOSIS — I4892 Unspecified atrial flutter: Secondary | ICD-10-CM | POA: Diagnosis not present

## 2021-03-25 DIAGNOSIS — N1831 Chronic kidney disease, stage 3a: Secondary | ICD-10-CM | POA: Diagnosis not present

## 2021-03-25 DIAGNOSIS — Z952 Presence of prosthetic heart valve: Secondary | ICD-10-CM | POA: Insufficient documentation

## 2021-03-25 DIAGNOSIS — I421 Obstructive hypertrophic cardiomyopathy: Secondary | ICD-10-CM | POA: Insufficient documentation

## 2021-03-25 DIAGNOSIS — I480 Paroxysmal atrial fibrillation: Secondary | ICD-10-CM

## 2021-03-25 LAB — ECHOCARDIOGRAM COMPLETE
AR max vel: 1.28 cm2
AV Area VTI: 1.25 cm2
AV Area mean vel: 1.2 cm2
AV Mean grad: 9.8 mmHg
AV Peak grad: 17.5 mmHg
Ao pk vel: 2.09 m/s
Area-P 1/2: 5.37 cm2
MV VTI: 1.14 cm2
S' Lateral: 3.7 cm

## 2021-03-25 MED ORDER — EMPAGLIFLOZIN 10 MG PO TABS: 10.0000 mg | ORAL_TABLET | Freq: Every day | ORAL | 3 refills | Status: DC

## 2021-03-25 NOTE — Progress Notes (Signed)
Amb ref

## 2021-03-29 ENCOUNTER — Other Ambulatory Visit: Payer: Self-pay

## 2021-03-29 ENCOUNTER — Ambulatory Visit (HOSPITAL_BASED_OUTPATIENT_CLINIC_OR_DEPARTMENT_OTHER): Payer: Medicare Other | Admitting: Anesthesiology

## 2021-03-29 ENCOUNTER — Encounter (HOSPITAL_COMMUNITY): Payer: Self-pay | Admitting: Cardiovascular Disease

## 2021-03-29 ENCOUNTER — Ambulatory Visit (HOSPITAL_COMMUNITY)
Admission: RE | Admit: 2021-03-29 | Discharge: 2021-03-29 | Disposition: A | Discharge status: Home / Self Care | Payer: Medicare Other | Attending: Cardiovascular Disease | Admitting: Cardiovascular Disease

## 2021-03-29 ENCOUNTER — Encounter (HOSPITAL_COMMUNITY)
Admission: RE | Disposition: A | Discharge status: Home / Self Care | Payer: Self-pay | Source: Home / Self Care | Attending: Cardiovascular Disease

## 2021-03-29 ENCOUNTER — Ambulatory Visit (HOSPITAL_COMMUNITY): Payer: Medicare Other | Admitting: Anesthesiology

## 2021-03-29 DIAGNOSIS — I272 Pulmonary hypertension, unspecified: Secondary | ICD-10-CM | POA: Diagnosis not present

## 2021-03-29 DIAGNOSIS — I340 Nonrheumatic mitral (valve) insufficiency: Secondary | ICD-10-CM | POA: Diagnosis not present

## 2021-03-29 DIAGNOSIS — I421 Obstructive hypertrophic cardiomyopathy: Secondary | ICD-10-CM | POA: Diagnosis not present

## 2021-03-29 DIAGNOSIS — Z6831 Body mass index (BMI) 31.0-31.9, adult: Secondary | ICD-10-CM | POA: Insufficient documentation

## 2021-03-29 DIAGNOSIS — I4892 Unspecified atrial flutter: Secondary | ICD-10-CM | POA: Insufficient documentation

## 2021-03-29 DIAGNOSIS — E669 Obesity, unspecified: Secondary | ICD-10-CM | POA: Insufficient documentation

## 2021-03-29 DIAGNOSIS — N183 Chronic kidney disease, stage 3 unspecified: Secondary | ICD-10-CM | POA: Insufficient documentation

## 2021-03-29 DIAGNOSIS — E7800 Pure hypercholesterolemia, unspecified: Secondary | ICD-10-CM | POA: Diagnosis not present

## 2021-03-29 DIAGNOSIS — I484 Atypical atrial flutter: Secondary | ICD-10-CM | POA: Diagnosis not present

## 2021-03-29 DIAGNOSIS — I5042 Chronic combined systolic (congestive) and diastolic (congestive) heart failure: Secondary | ICD-10-CM | POA: Insufficient documentation

## 2021-03-29 DIAGNOSIS — I509 Heart failure, unspecified: Secondary | ICD-10-CM

## 2021-03-29 DIAGNOSIS — I480 Paroxysmal atrial fibrillation: Secondary | ICD-10-CM | POA: Diagnosis not present

## 2021-03-29 DIAGNOSIS — Z79899 Other long term (current) drug therapy: Secondary | ICD-10-CM | POA: Diagnosis not present

## 2021-03-29 DIAGNOSIS — Z953 Presence of xenogenic heart valve: Secondary | ICD-10-CM | POA: Diagnosis not present

## 2021-03-29 HISTORY — PX: CARDIOVERSION: SHX1299

## 2021-03-29 SURGERY — CARDIOVERSION
Anesthesia: General

## 2021-03-29 MED ORDER — SODIUM CHLORIDE 0.9 % IV SOLN: INTRAVENOUS | Status: DC

## 2021-03-29 MED ORDER — PROPOFOL 10 MG/ML IV BOLUS
INTRAVENOUS | Status: DC | PRN
Start: 2021-03-29 — End: 2021-03-29
  Administered 2021-03-29: 50 mg via INTRAVENOUS

## 2021-03-29 MED ORDER — LIDOCAINE 2% (20 MG/ML) 5 ML SYRINGE
INTRAMUSCULAR | Status: DC | PRN
Start: 2021-03-29 — End: 2021-03-29
  Administered 2021-03-29: 40 mg via INTRAVENOUS

## 2021-03-29 NOTE — Anesthesia Preprocedure Evaluation (Addendum)
Anesthesia Evaluation  Patient identified by MRN, date of birth, ID band Patient awake    Reviewed: Allergy & Precautions, NPO status , Patient's Chart, lab work & pertinent test results, reviewed documented beta blocker date and time   Airway Mallampati: II  TM Distance: >3 FB Neck ROM: Full    Dental no notable dental hx. (+) Dental Advisory Given   Pulmonary shortness of breath and with exertion, former smoker,    Pulmonary exam normal breath sounds clear to auscultation       Cardiovascular +CHF  + Valvular Problems/Murmurs MR  Rhythm:Regular Rate:Tachycardia  Hx/o HOCM S/P resection S/P MVR  EKG 03/29/21 Sinus tachycardia ?Atrial flutter with 2:1 block, LBBB pattern   Neuro/Psych negative neurological ROS  negative psych ROS   GI/Hepatic negative GI ROS, Neg liver ROS,   Endo/Other  Obesity   Renal/GU negative Renal ROS  negative genitourinary   Musculoskeletal negative musculoskeletal ROS (+)   Abdominal (+) + obese,   Peds  Hematology Chronic anticoagulation xarelto- last dose this am   Anesthesia Other Findings   Reproductive/Obstetrics                             Anesthesia Physical Anesthesia Plan  ASA: 3  Anesthesia Plan: General   Post-op Pain Management:    Induction: Intravenous  PONV Risk Score and Plan: 3 and Treatment may vary due to age or medical condition  Airway Management Planned: Natural Airway and Mask  Additional Equipment:   Intra-op Plan:   Post-operative Plan:   Informed Consent: I have reviewed the patients History and Physical, chart, labs and discussed the procedure including the risks, benefits and alternatives for the proposed anesthesia with the patient or authorized representative who has indicated his/her understanding and acceptance.       Plan Discussed with: Anesthesiologist and CRNA  Anesthesia Plan Comments:          Anesthesia Quick Evaluation

## 2021-03-29 NOTE — Interval H&P Note (Signed)
History and Physical Interval Note:  03/29/2021 10:43 AM  Melinda Gonzales  has presented today for surgery, with the diagnosis of AFLUTTER.  The various methods of treatment have been discussed with the patient and family. After consideration of risks, benefits and other options for treatment, the patient has consented to  Procedure(s): CARDIOVERSION (N/A) as a surgical intervention.  The patient's history has been reviewed, patient examined, no change in status, stable for surgery.  I have reviewed the patient's chart and labs.  Questions were answered to the patient's satisfaction.     Rolla Kedzierski

## 2021-03-29 NOTE — Addendum Note (Signed)
Addendum  created 03/29/21 1151 by Josephine Igo, MD   Clinical Note Signed, Review and Sign - Ready for Procedure

## 2021-03-29 NOTE — Anesthesia Postprocedure Evaluation (Signed)
Anesthesia Post Note  Patient: Melinda Gonzales  Procedure(s) Performed: CARDIOVERSION     Patient location during evaluation: PACU Anesthesia Type: General Level of consciousness: awake and alert and oriented Pain management: pain level controlled Vital Signs Assessment: post-procedure vital signs reviewed and stable Respiratory status: spontaneous breathing, nonlabored ventilation and respiratory function stable Cardiovascular status: blood pressure returned to baseline and stable Postop Assessment: no apparent nausea or vomiting Anesthetic complications: no   No notable events documented.  Last Vitals:  Vitals:   03/29/21 1128 03/29/21 1141  BP: (!) 85/35 (!) 99/38  Pulse: (!) 49 (!) 50  Resp: 16 18  Temp:    SpO2: 94% 95%    Last Pain:  Vitals:   03/29/21 1141  TempSrc:   PainSc: 0-No pain                 Gatha Mcnulty A.

## 2021-03-29 NOTE — Anesthesia Procedure Notes (Signed)
Procedure Name: MAC Date/Time: 03/29/2021 11:20 AM Performed by: Alain Marion, CRNA Pre-anesthesia Checklist: Patient identified and Emergency Drugs available Oxygen Delivery Method: Ambu bag Preoxygenation: Pre-oxygenation with 100% oxygen Induction Type: IV induction Placement Confirmation: positive ETCO2

## 2021-03-29 NOTE — Op Note (Signed)
Procedure: Electrical Cardioversion Indications:  Atrial Flutter  Procedure Details:  Consent: Risks of procedure as well as the alternatives and risks of each were explained to the (patient/caregiver).  Consent for procedure obtained.  Time Out: Verified patient identification, verified procedure, site/side was marked, verified correct patient position, special equipment/implants available, medications/allergies/relevent history reviewed, required imaging and test results available.  Performed  Patient placed on cardiac monitor, pulse oximetry, supplemental oxygen as necessary.  Sedation given:  Propofol 50 mg IV, Dr. Royce Macadamia Pacer pads placed anterior and posterior chest.  Cardioverted 1 time(s).  Cardioversion with synchronized biphasic 120J shock.  Evaluation: Findings: Post procedure EKG shows: Atrial Flutter Complications: None Patient did tolerate procedure well.  Time Spent Directly with the Patient:  30 minutes   Melinda Gonzales 03/29/2021, 11:23 AM

## 2021-03-29 NOTE — Transfer of Care (Signed)
Immediate Anesthesia Transfer of Care Note  Patient: Melinda Gonzales  Procedure(s) Performed: CARDIOVERSION  Patient Location: Endoscopy Unit  Anesthesia Type:General  Level of Consciousness: awake, alert  and patient cooperative  Airway & Oxygen Therapy: Patient Spontanous Breathing  Post-op Assessment: Report given to RN and Post -op Vital signs reviewed and stable  Post vital signs: Reviewed and stable  Last Vitals:  Vitals Value Taken Time  BP 87/39   Temp    Pulse 54   Resp 14   SpO2 95     Last Pain:  Vitals:   03/29/21 1027  TempSrc: Oral         Complications: No notable events documented.

## 2021-03-30 ENCOUNTER — Encounter (HOSPITAL_COMMUNITY): Payer: Self-pay | Admitting: Cardiovascular Disease

## 2021-03-30 ENCOUNTER — Other Ambulatory Visit: Payer: Self-pay

## 2021-03-30 DIAGNOSIS — I484 Atypical atrial flutter: Secondary | ICD-10-CM

## 2021-03-30 NOTE — Progress Notes (Signed)
Mb ref  ?

## 2021-03-31 ENCOUNTER — Telehealth: Payer: Self-pay | Admitting: Cardiology

## 2021-03-31 NOTE — Telephone Encounter (Signed)
Patient's husband is requesting to speak with Malachy Mood, LPN. He would like to know whether an EP consult is still necessary. States after 2/28 cardioverison patient is no longer going into afib. Please advise. ?

## 2021-03-31 NOTE — Telephone Encounter (Signed)
Spoke to patient and her husband.Dr.Jordan advised needs to see Dr.Taylor tomorrow to discuss afib ablation.He stated this was the second time your heart went out of rhythm causing low EF.She stated she will keep appointment. ?

## 2021-04-01 ENCOUNTER — Ambulatory Visit (INDEPENDENT_AMBULATORY_CARE_PROVIDER_SITE_OTHER): Payer: Medicare Other | Admitting: Internal Medicine

## 2021-04-01 ENCOUNTER — Encounter: Payer: Self-pay | Admitting: Internal Medicine

## 2021-04-01 ENCOUNTER — Other Ambulatory Visit: Payer: Self-pay

## 2021-04-01 VITALS — BP 116/64 | HR 55 | Ht 61.0 in | Wt 184.0 lb

## 2021-04-01 DIAGNOSIS — I421 Obstructive hypertrophic cardiomyopathy: Secondary | ICD-10-CM | POA: Diagnosis not present

## 2021-04-01 DIAGNOSIS — I4892 Unspecified atrial flutter: Secondary | ICD-10-CM | POA: Diagnosis not present

## 2021-04-01 NOTE — Progress Notes (Signed)
? ? ? ? ?HPI ?[name] is referred today by Dr. Martinique for evaluation of atypical atrial flutter. She has valvular heart disease, and has undergone recent DCCV. She has a h/o HCM and has undergone septal myomectomy and mitral valve replacement. She has residual LBBB. She been maintained on Xarelto. She had a MAZE with her myomectomy. She was initially placed on amiodarone. There was a question that this resulted in optic nerve problems. Since her DCCV she has maintained NSR. ?Allergies  ?Allergen Reactions  ? Oxycodone Nausea And Vomiting  ?  And the sweats  ? Statins   ?  Myalgias tolerates crestor  ? ? ? ?Current Outpatient Medications  ?Medication Sig Dispense Refill  ? Apoaequorin (PREVAGEN) 10 MG CAPS Take 1 capsule by mouth daily.    ? carvedilol (COREG) 6.25 MG tablet TAKE 1 TABLET BY MOUTH TWICE A DAY 60 tablet 6  ? empagliflozin (JARDIANCE) 10 MG TABS tablet Take 1 tablet (10 mg total) by mouth daily before breakfast. 90 tablet 3  ? ENTRESTO 24-26 MG TAKE 1 TABLET BY MOUTH TWICE A DAY 60 tablet 10  ? furosemide (LASIX) 40 MG tablet TAKE 1 TABLET BY MOUTH DAILY 30 tablet 8  ? latanoprost (XALATAN) 0.005 % ophthalmic solution Place 1 drop into both eyes at bedtime.    ? Multiple Vitamins-Minerals (PRESERVISION AREDS 2) CAPS Take 1 capsule by mouth daily.    ? potassium chloride SA (KLOR-CON) 20 MEQ tablet TAKE 1 TABLET BY MOUTH DAILY 30 tablet 6  ? Rivaroxaban (XARELTO) 15 MG TABS tablet TAKE 1 TABLET BY MOUTH DAILY WITH SUPPER 30 tablet 5  ? rosuvastatin (CRESTOR) 10 MG tablet TAKE 1 TABLET (10 MG TOTAL) BY MOUTH DAILY. 30 tablet 6  ? ?No current facility-administered medications for this visit.  ? ? ? ?Past Medical History:  ?Diagnosis Date  ? Chronic diastolic CHF (congestive heart failure) ([place]) 07/06/2015  ? Dyspnea   ? Heart murmur   ? Hypercholesterolemia   ? Hypertrophic obstructive cardiomyopathy (HOCM) ([place])   ? LVH (left ventricular hypertrophy)   ? WITH NORMAL SYSTOLIC FUNCTION  ? SOB  (shortness of breath)   ? ? ?ROS: ? ? All systems reviewed and negative except as noted in the HPI. ? ? ?Past Surgical History:  ?Procedure Laterality Date  ? CARDIAC CATHETERIZATION N/A 07/06/2015  ? Procedure: Right/Left Heart Cath and Coronary Angiography;  Surgeon: Peter M Martinique, MD;  Location: [facility];  Service: Cardiovascular;  Laterality: N/A;  ? CARDIOVERSION N/A 10/23/2018  ? Procedure: CARDIOVERSION;  Surgeon: Donato Heinz, MD;  Location: [facility] ENDOSCOPY;  Service: Endoscopy;  Laterality: N/A;  ? CARDIOVERSION N/A 03/29/2021  ? Procedure: CARDIOVERSION;  Surgeon: Sanda Klein, MD;  Location: [facility] ENDOSCOPY;  Service: Cardiovascular;  Laterality: N/A;  ? MITRAL VALVE REPLACEMENT N/A 11/15/2015  ? ligation of left atrial appendage, maze procedure at [facility]  ? ? ? ?Family History  ?Problem Relation Age of Onset  ? Heart failure Mother 35  ? Heart attack Father 50  ? ? ? ?Social History  ? ?Socioeconomic History  ? Marital status: Married  ?  Spouse name: Not on file  ? Number of children: 2  ? Years of education: Not on file  ? Highest education level: Not on file  ?Occupational History  ?  Employer: RETIRED  ?  Comment: retired  ?Tobacco Use  ? Smoking status: Former  ?  Types: Cigarettes  ?  Quit date: 05/26/1965  ?  Years since quitting: 55.8  ? Smokeless tobacco: Never  ?Vaping Use  ? Vaping Use: Never used  ?Substance and Sexual Activity  ? Alcohol use: No  ? Drug use: No  ? Sexual activity: Not on file  ?Other Topics Concern  ? Not on file  ?Social History Narrative  ? Not on file  ? ?Social Determinants of Health  ? ?Financial Resource Strain: Not on file  ?Food Insecurity: Not on file  ?Transportation Needs: Not on file  ?Physical Activity: Not on file  ?Stress: Not on file  ?Social Connections: Not on file  ?Intimate Partner Violence: Not on file  ? ? ? ?BP 116/64   Pulse (!) 55   Ht 5\' 1"  (1.549 m)   Wt 184 lb (83.5 kg)   SpO2 93%   BMI 34.77 kg/m?  ? ?Physical  Exam: ? ?Well appearing NAD ?HEENT: Unremarkable ?Neck:  No JVD, no thyromegally ?Lymphatics:  No adenopathy ?Back:  No CVA tenderness ?Lungs:  Clear ?HEART:  Regular rate rhythm, no murmurs, no rubs, no clicks ?Abd:  soft, positive bowel sounds, no organomegally, no rebound, no guarding ?Ext:  2 plus pulses, no edema, no cyanosis, no clubbing ?Skin:  No rashes no nodules ?Neuro:  CN II through XII intact, motor grossly intact ? ?EKG - sinus brady with LBBB ? ?Assess/Plan ?Atypical atrial flutter/fib - she is back in NSR. She is now off her amiodarone. She will undergo watchful waiting. Her atrial flutter from last month may be typical flutter. For now I would recommend watchful waiting. In the absence of amio, there are not very many good options for rhythm control. Left atrial ablation of atypical flutter in an 87 yo patient is not a good idea.  ? tachy/brady - though she is doing better now, if she has more atrial fib/flutter, then I would consider AV node ablation and Biv PPM insertion  ?Chronic systolic heart failure - her symptoms are well controlled. She is pending a repeat. If her EF remains down, biv PPM would be a consideration. She will not tolerate atypical atrial flutter. ?HCM - she is s/p myomectomy. She appears to be improved.  ?

## 2021-04-01 NOTE — Patient Instructions (Addendum)
Medication Instructions:  Your physician recommends that you continue on your current medications as directed. Please refer to the Current Medication list given to you today.  Labwork: None ordered.  Testing/Procedures: None ordered.  Follow-Up: Your physician wants you to follow-up in: as needed with Gregg Taylor, MD    Any Other Special Instructions Will Be Listed Below (If Applicable).  If you need a refill on your cardiac medications before your next appointment, please call your pharmacy.       

## 2021-04-07 NOTE — Progress Notes (Unsigned)
[name] Date of Birth: 10/02/1934   History of Present Illness: Melinda Gonzales is seen for follow up of HOCM  She was evaluated in 2011 with a Myoview stress test which was normal. She had an Echo at that time that showed moderate LVH with EF 65-70%. Mild MR. She does have a history of murmur. She is not a smoker. In early 2016 she presented with increased dyspnea and an Echo and cardiac MRI were done. These with both consistent with HOCM. LVOT gradient of 112 mm Hg at rest. She was started on low dose lasix and Toprol XL with good response initially with improved dyspnea.  Seen last year with symptoms of increased dyspnea. No increased edema or orthopnea but more dyspnea with walking or lifting. No chest pain. No dizziness or palpitations. Energy level has decreased. Repeat Echo showed severe LVOT gradient and MR.  She underwent cardiac cath with findings of LVOT gradient and MR. No significant CAD. Pulmonary HTN. She was referred to the [facility] clinic and underwent surgery on 11/05/15. Prior to surgery she developed Afib and had a DCCV. Surgery  included a septal myectomy, MVR with a #29 mm [employer] Jude  Biocor valve, left sided Cryo MAZE, and ligation of the left atrial appendage. Her post op course was remarkable for paroxysmal atrial fibrillation and she was started on amiodarone and Xarelto.  Ecg showed a LBBB. Repeat Echo on 11/11/15 showed normal LV function with EF 60%. Mean MV gradient 8 mm Hg. Trace MR. Mild TR. There was a fixed LVOT gradient of 32 mm Hg peak and mean of 16 mm Hg. Repeat Echo 02/03/16 showed no LVOT gradient and normal MV prosthesis function.   She was seen in September 2020 with symptoms of persistent tachycardia and SOB. HR in the 120s. Echo was obtained and showed significant decline in LV function with EF 25-30%. After review of Ecg tracings and reviewing with EP it appeared she was in Atrial flutter. BNP was 467. TSH was mildly elevated 6.0. She was started on  Coreg. Lasix was increased she was on Xarelto. She underwent  DCCV on 10/23/18. Subsequent Echo in December showed EF had returned to normal.   When seen in February she was back in Atrial flutter- atypical. Once again this was associated with significant drop in EF to 30-35%. Mitral prosthesis was functioning well. Mild to moderate AS. She underwent successful DCCV on 03/29/21. Was seen by Dr Lovena Le afterwards. He felt there were limited options for AAD therapy given intolerance to amiodarone. Did not feel she was a good candidate for ablation at her age and comorbidities. Consideration of AV node ablation and BiV pacer if flutter recurs or if EF does not recover.   On follow up today she is doing very well.  She denies any dyspnea. No edema. No chest pain. Feels a little lightheaded and nauseated in the am but gets better after she eats.     Current Outpatient Medications on File Prior to Visit  Medication Sig Dispense Refill   Apoaequorin (PREVAGEN) 10 MG CAPS Take 1 capsule by mouth daily.     carvedilol (COREG) 6.25 MG tablet TAKE 1 TABLET BY MOUTH TWICE A DAY 60 tablet 6   empagliflozin (JARDIANCE) 10 MG TABS tablet Take 1 tablet (10 mg total) by mouth daily before breakfast. 90 tablet 3   ENTRESTO 24-26 MG TAKE 1 TABLET BY MOUTH TWICE A DAY 60 tablet 10   furosemide (LASIX) 40 MG tablet TAKE 1  TABLET BY MOUTH DAILY 30 tablet 8   latanoprost (XALATAN) 0.005 % ophthalmic solution Place 1 drop into both eyes at bedtime.     Multiple Vitamins-Minerals (PRESERVISION AREDS 2) CAPS Take 1 capsule by mouth daily.     potassium chloride SA (KLOR-CON) 20 MEQ tablet TAKE 1 TABLET BY MOUTH DAILY 30 tablet 6   Rivaroxaban (XARELTO) 15 MG TABS tablet TAKE 1 TABLET BY MOUTH DAILY WITH SUPPER 30 tablet 5   rosuvastatin (CRESTOR) 10 MG tablet TAKE 1 TABLET (10 MG TOTAL) BY MOUTH DAILY. 30 tablet 6   No current facility-administered medications on file prior to visit.    Allergies  Allergen Reactions    Oxycodone Nausea And Vomiting    And the sweats   Statins     Myalgias tolerates crestor    Past Medical History:  Diagnosis Date   Chronic diastolic CHF (congestive heart failure) ([place]) 07/06/2015   Dyspnea    Heart murmur    Hypercholesterolemia    Hypertrophic obstructive cardiomyopathy (HOCM) ([facility])    LVH (left ventricular hypertrophy)    WITH NORMAL SYSTOLIC FUNCTION   SOB (shortness of breath)     Past Surgical History:  Procedure Laterality Date   CARDIAC CATHETERIZATION N/A 07/06/2015   Procedure: Right/Left Heart Cath and Coronary Angiography;  Surgeon: Cameryn Chrisley M Martinique, MD;  Location: [facility] LAB;  Service: Cardiovascular;  Laterality: N/A;   CARDIOVERSION N/A 10/23/2018   Procedure: CARDIOVERSION;  Surgeon: Donato Heinz, MD;  Location: [facility] ENDOSCOPY;  Service: Endoscopy;  Laterality: N/A;   CARDIOVERSION N/A 03/29/2021   Procedure: CARDIOVERSION;  Surgeon: Sanda Klein, MD;  Location: [facility] ENDOSCOPY;  Service: Cardiovascular;  Laterality: N/A;   MITRAL VALVE REPLACEMENT N/A 11/15/2015   ligation of left atrial appendage, maze procedure at [facility] History   Tobacco Use  Smoking Status Former   Types: Cigarettes   Quit date: 05/26/1965   Years since quitting: 55.9  Smokeless Tobacco Never    Social History   Substance and Sexual Activity  Alcohol Use No    Family History  Problem Relation Age of Onset   Heart failure Mother 33   Heart attack Father 47    Review of Systems: As noted in HPI.   All other systems were reviewed and are negative.  Physical Exam: There were no vitals taken for this visit.  There is no height or weight on file to calculate BMI. GENERAL:  Well appearing obese WF in NAD HEENT:  PERRL, EOMI, sclera are clear. Oropharynx is clear. NECK:  No jugular venous distention, carotid upstroke brisk and symmetric, no bruits, no thyromegaly or adenopathy LUNGS:  Clear to auscultation bilaterally CHEST:   Unremarkable HEART:  IRRR,  PMI not displaced or sustained,S1 and S2 within normal limits, no S3, no S4: no clicks, no rubs, gr 1/6 systolic murmur at the apex>> RUSB ABD:  Soft, nontender. BS +, no masses or bruits. No hepatomegaly, no splenomegaly EXT:  2 + pulses throughout, no edema, no cyanosis no clubbing SKIN:  Warm and dry.  No rashes NEURO:  Alert and oriented x 3. Cranial nerves II through XII intact. PSYCH:  Cognitively intact    LABORATORY DATA:   Lab Results  Component Value Date   WBC 7.4 03/22/2021   HGB 12.9 03/22/2021   HCT 39.3 03/22/2021   PLT 184 03/22/2021   GLUCOSE 130 (H) 03/22/2021   CHOL 145 03/22/2021   TRIG 172 (H) 03/22/2021  HDL 46 03/22/2021   LDLCALC 70 03/22/2021   ALT 23 03/22/2021   AST 19 03/22/2021   NA 139 03/22/2021   K 4.8 03/22/2021   CL 105 03/22/2021   CREATININE 1.68 (H) 03/22/2021   BUN 35 (H) 03/22/2021   CO2 21 03/22/2021   TSH 7.730 (H) 03/22/2021   INR 1.03 07/06/2015   Labs dated 03/11/16: creatinine 1.6 Dated 07/27/16: cholesterol 192, triglycerides 165, HDL 52, LDL 107. Creatinine 1.27. Potassium and ALT normal.  Dated 02/07/17: BUN 27, creatinine 1.25. Glucose 113. Other chemistries normal. Cholesterol 199, triglycerides 212, HDL 44, LDL 113.  Dated 02/08/18: cholesterol 161, triglycerides 209, HDL 44, LDL 75. LFTs normal. Dated 10/13/19: A1c 6.7%. triglycerides 269, HDL 39. Unable to see other results in [place] Dated 01/13/20: BUN 33, creatinine 1.76. glucose 137. Potassium 5.1. sodium 144. A1c 6.4% Dated 12/03/20: cholesterol 151, triglycerides 185, HDL 42, LDL 78. A1c 6.5%. BUN 37, creatinine 1.67. otherwise CMET normal.  Ecg today shows atrial flutter with 2:1 AV conduction. Rate 103. LBBB. I have personally reviewed and interpreted this study.   Echo 02/03/16: Study Conclusions   - Left ventricle: The cavity size was normal. There was moderate   concentric hypertrophy. Systolic function was normal. The   estimated  ejection fraction was in the range of 60% to 65%. Wall   motion was normal; there were no regional wall motion   abnormalities. The study is not technically sufficient to allow   evaluation of LV diastolic function due to the presence of a   bioprosthetic mitral valve. - Aortic valve: Valve mobility was mildly restricted. There was   mild stenosis. There was mild regurgitation. - Mitral valve: Mildly calcified annulus. A 41m St. Jude biocor   bioprosthesis was present and functioning well. - Left atrium: The atrium was severely dilated. - Right ventricle: The cavity size was normal. Wall thickness was   normal. Systolic function was normal. - Tricuspid valve: There was mild regurgitation. - Pulmonary arteries: Systolic pressure was mildly increased. PA   peak pressure: 42 mm Hg (S).   Impressions:   - Since the last echo 06/15/2015, a bioprosthetic mitral valve is   present and functioning well. Septal wall thickness is now 1.5 cm   compared with 1.8 cm pre-operatively.  Echo 04/19/17: Study Conclusions   - Valve surgery. Mitral valve replacement with a [employer]   porcine valve. 23mBiocor bioprosthetic valve. - Left ventricle: The cavity size was normal. Wall thickness was   increased in a pattern of mild LVH. Systolic function was normal.   The estimated ejection fraction was in the range of 55% to 60%.   Wall motion was normal; there were no regional wall motion   abnormalities. Doppler parameters are consistent with a   reversible restrictive pattern, indicative of decreased left   ventricular diastolic compliance and/or increased left atrial   pressure (grade 3 diastolic dysfunction). - Aortic valve: There was trivial regurgitation. Mean gradient (S):   11 mm Hg. Peak gradient (S): 19 mm Hg. - Mitral valve: A bioprosthesis was present. The prosthesis had a   normal range of motion. Mean gradient (D): 5 mm Hg. - Left atrium: The atrium was mildly dilated. -  Pulmonary arteries: Systolic pressure was moderately increased.   PA peak pressure: 56 mm Hg (S).  Echocardiogram 10/15/2018: 1. The left ventricle has severely reduced systolic function, with an ejection fraction of 25-30%. The cavity size was normal. Left ventricular diastolic function could  not be evaluated. Elevated mean left atrial pressure Left ventricular diffuse  hypokinesis.  2. The right ventricle has moderately reduced systolic function. The cavity was normal. Right ventricular systolic pressure is mildly elevated with an estimated pressure of 41.8 mmHg.  3. Left atrial size was moderately dilated.  4. The tricuspid valve is grossly normal. Tricuspid valve regurgitation is moderate.  5. The aortic valve has an indeterminate number of cusps. Mild calcification of the aortic valve. Aortic valve regurgitation is trivial by color flow Doppler. No stenosis of the aortic valve.  6. The aorta is normal unless otherwise noted.  7. The inferior vena cava was dilated in size with >50% respiratory variability.  8. Septal dyssynergy and global hypokinesis; overall severe LV dysfunction; trace AI; s/p MVR with no MR; moderate LAE; moderate RV dysfunction; moderate TR and mild pulmonary hypertension.  Echo: 01/13/19: IMPRESSIONS     1. Left ventricular ejection fraction, by visual estimation, is 60 to  65%. The left ventricle has normal function. There is severely increased  left ventricular hypertrophy.   2. Left ventricular diastolic function could not be evaluated.   3. The left ventricle has no regional wall motion abnormalities.   4. Global right ventricle has normal systolic function.The right  ventricular size is normal. No increase in right ventricular wall  thickness.   5. Left atrial size was normal.   6. Right atrial size was normal.   7. The mitral valve has been repaired/replaced. No evidence of mitral  valve regurgitation.   8. The tricuspid valve is grossly normal. Tricuspid  valve regurgitation  is trivial.   9. The aortic valve is abnormal. Aortic valve regurgitation is mild. Mild  aortic valve sclerosis without stenosis.  10. Pulmonic regurgitation is mild.  11. The pulmonic valve was normal in structure. Pulmonic valve  regurgitation is mild.  12. The atrial septum is grossly normal.    Echo 03/25/21: IMPRESSIONS     1. In afib/flutter with RVR during study. Left ventricular ejection  fraction, by estimation, is 30 to 35%. The left ventricle has moderately  decreased function. The left ventricle demonstrates global hypokinesis.  There is mild left ventricular  hypertrophy. Left ventricular diastolic parameters are indeterminate.   2. Right ventricular systolic function is moderately reduced. The right  ventricular size is normal. There is normal pulmonary artery systolic  pressure. The estimated right ventricular systolic pressure is 69.6 mmHg.   3. Left atrial size was moderately dilated.   4. There is a Biocor tissue heart valve present in the mitral position.  Procedure Date: 11/15/2015.      Trivial mitral valve regurgitation. Echo findings are consistent with  stenosis of the mitral prosthesis.      The mean mitral valve gradient is 10.0 mmHg at 123 bpm. MVA 1.3 cm^2  by continuity equation   5. The aortic valve was not well visualized. Aortic valve regurgitation  is not visualized. Mild to moderate aortic valve stenosis. Mild AS by  gradients (MG 12 mmHg, Vmax 2.4 m/s), moderate by AVA (1.0cm^2) and DI  (0.35). Low SV index (22 cc/m^2), likely   low flow low gradient moderate AS   6. The inferior vena cava is normal in size with greater than 50%  respiratory variability, suggesting right atrial pressure of 3 mmHg.    Assessment / Plan: 1. HOCM.  s/p septal myectomy on 11/05/15. Excellent result by follow up Echo. No significant residual LVOT gradient.   2. Severe MR s/p MVR with #  [place] Biocor valve. Routine SBE prophylaxis.   3.   Chronic systolic CHF. LV dysfunction associated in the past with atrial flutter. EF 25-30%. Likely tachycardia mediated with new Atrial flutter with RVR. After DCCV EF  normalized based on Echo in December 2020. On Coreg, lasix and  Entresto 24/26 mg bid.  Will update Echo now especially since she is in [place] today.  4. Paroxysmal Atrial fibrillation/flutter.  post op open heart surgery. S/p left sided MAZE. Seen in September 2020 with  new onset Atrial flutter.  DCCV on 10/23/18. Had been on amiodarone post op heart surgery. Patient questions if this was the cause of her optic nerve problem. Has been on Xarelto long term and hasn't missed any doses. Now with recurrence in Feb. S/p DCCV. Seen by Dr Lovena Le- poor candidate for AAD therapy or ablation. Consider for BiV pacer.   5. Hypercholesterolemia. On low dose Crestor. No history of CAD.  6. CKD stage 2-3.   7. Obesity. Discussed weight loss strategy of limiting Etoh intake and restricting carbohydrate intake.     I will follow up post DCCV

## 2021-04-12 ENCOUNTER — Ambulatory Visit: Payer: Medicare Other | Admitting: Cardiology

## 2021-04-12 ENCOUNTER — Telehealth: Payer: Self-pay | Admitting: Cardiology

## 2021-04-12 NOTE — Telephone Encounter (Signed)
? ?  Pt's husband called, he said pt been throwing up and can't make it to the appt today. Tried to r/s on Dr. Doug Sou next available, it's in June and husband said pt needs to be seen sooner, offered PA and waitlist both declined and requested for Malachy Mood to call them back to get pt a sooner appt  ?

## 2021-04-12 NOTE — Telephone Encounter (Signed)
Spoke to patient she stated she had to cancel appointment with Dr.Jordan today.Stated she did not feel like coming.Stated she is starting to feel better this afternoon.Appointment rescheduled with Dr.Jordan to 04/27/21 at 3:40 pm. ?

## 2021-04-13 DIAGNOSIS — R310 Gross hematuria: Secondary | ICD-10-CM | POA: Diagnosis not present

## 2021-04-22 NOTE — Progress Notes (Signed)
? ?Melinda Gonzales ?Date of Birth: Jun 18, 1934 ? ? ?History of Present Illness: ?[name] is seen for follow up of atrial flutter and CHF ? ?She was evaluated in 2011 with a Myoview stress test which was normal. She had an Echo at that time that showed moderate LVH with EF 65-70%. Mild MR. She does have a history of murmur. She is not a smoker. In early 2016 she presented with increased dyspnea and an Echo and cardiac MRI were done. These with both consistent with HOCM. LVOT gradient of 112 mm Hg at rest. She was started on low dose lasix and Toprol XL with good response initially with improved dyspnea. ? ?Seen last year with symptoms of increased dyspnea. No increased edema or orthopnea but more dyspnea with walking or lifting. No chest pain. No dizziness or palpitations. Energy level has decreased. Repeat Echo showed severe LVOT gradient and MR.  She underwent cardiac cath with findings of LVOT gradient and MR. No significant CAD. Pulmonary HTN. She was referred to the [facility] clinic and underwent surgery on 11/05/15. Prior to surgery she developed Afib and had a DCCV. Surgery  included a septal myectomy, MVR with a #29 mm [employer] Jude  Biocor valve, left sided Cryo MAZE, and ligation of the left atrial appendage. Her post op course was remarkable for paroxysmal atrial fibrillation and she was started on amiodarone and Xarelto.  Ecg showed a LBBB. Repeat Echo on 11/11/15 showed normal LV function with EF 60%. Mean MV gradient 8 mm Hg. Trace MR. Mild TR. There was a fixed LVOT gradient of 32 mm Hg peak and mean of 16 mm Hg. Repeat Echo 02/03/16 showed no LVOT gradient and normal MV prosthesis function. There was some concern that amiodarone caused her to have an optic nerve problem with vision loss - this was discontinued.  ? ?She was seen in September 2020 with symptoms of persistent tachycardia and SOB. HR in the 120s. Echo was obtained and showed significant decline in LV function with EF 25-30%. After review  of Ecg tracings and reviewing with EP it appeared she was in Atrial flutter. BNP was 467. TSH was mildly elevated 6.0. She was started on Coreg. Lasix was increased she was on Xarelto. She underwent  DCCV on 10/23/18. Subsequent Echo in December showed EF had returned to normal.  ? ?When seen in February she was back in Atrial flutter- atypical. Once again this was associated with significant drop in EF to 30-35%. Mitral prosthesis was functioning well. Mild to moderate AS. She underwent successful DCCV on 03/29/21. Was seen by Dr Lovena Le afterwards on 04/01/21 and was in NSR. He felt there were limited options for AAD therapy given intolerance to amiodarone. Did not feel she was a good candidate for ablation at her age and comorbidities. Consideration of AV node ablation and BiV pacer if flutter recurs or if EF does not recover.  ? ?On follow up today she is seen with her husband. He notes that for the past week his monitor cannot register her heart beat. She is still She denies any dyspnea. No edema. No chest pain. She has not been weighing at home. She is clearly SOB walking into office.  ? ? ?Current Outpatient Medications on File Prior to Visit  ?Medication Sig Dispense Refill  ? Apoaequorin (PREVAGEN) 10 MG CAPS Take 1 capsule by mouth daily.    ? carvedilol (COREG) 6.25 MG tablet TAKE 1 TABLET BY MOUTH TWICE A DAY 60 tablet 6  ?  empagliflozin (JARDIANCE) 10 MG TABS tablet Take 1 tablet (10 mg total) by mouth daily before breakfast. 90 tablet 3  ? ENTRESTO 24-26 MG TAKE 1 TABLET BY MOUTH TWICE A DAY 60 tablet 10  ? furosemide (LASIX) 40 MG tablet TAKE 1 TABLET BY MOUTH DAILY 30 tablet 8  ? latanoprost (XALATAN) 0.005 % ophthalmic solution Place 1 drop into both eyes at bedtime.    ? Multiple Vitamins-Minerals (PRESERVISION AREDS 2) CAPS Take 1 capsule by mouth daily.    ? potassium chloride SA (KLOR-CON) 20 MEQ tablet TAKE 1 TABLET BY MOUTH DAILY 30 tablet 6  ? Rivaroxaban (XARELTO) 15 MG TABS tablet TAKE 1 TABLET  BY MOUTH DAILY WITH SUPPER 30 tablet 5  ? rosuvastatin (CRESTOR) 10 MG tablet TAKE 1 TABLET (10 MG TOTAL) BY MOUTH DAILY. 30 tablet 6  ? ?No current facility-administered medications on file prior to visit.  ? ? ?Allergies  ?Allergen Reactions  ? Oxycodone Nausea And Vomiting  ?  And the sweats  ? Statins   ?  Myalgias tolerates crestor  ? ? ?Past Medical History:  ?Diagnosis Date  ? Chronic diastolic CHF (congestive heart failure) ([place]) 07/06/2015  ? Dyspnea   ? Heart murmur   ? Hypercholesterolemia   ? Hypertrophic obstructive cardiomyopathy (HOCM) ([place])   ? LVH (left ventricular hypertrophy)   ? WITH NORMAL SYSTOLIC FUNCTION  ? SOB (shortness of breath)   ? ? ?Past Surgical History:  ?Procedure Laterality Date  ? CARDIAC CATHETERIZATION N/A 07/06/2015  ? Procedure: Right/Left Heart Cath and Coronary Angiography;  Surgeon: Kunaal Walkins M Martinique, MD;  Location: [place] [facility];  Service: Cardiovascular;  Laterality: N/A;  ? CARDIOVERSION N/A 10/23/2018  ? Procedure: CARDIOVERSION;  Surgeon: Donato Heinz, MD;  [facility] END[facility]COPY;  Service: Endoscopy;  Laterality: N/A;  ? CARDIOVERSION N/A 03/29/2021  ? Procedure: CARDIOVERSION;  Surgeon: Sanda Klein, MD;  Location: [place] ENDOSCOPY;  Service: Cardiovascular;  Laterality: N/A;  ? MITRAL VALVE REPLACEMENT N/A 11/15/2015  ? ligation of left atrial appendage, maze procedure at [facility]  ? ? ?Social History  ? ?Tobacco Use  ?Smoking Status Former  ? Types: Cigarettes  ? Quit date: 05/26/1965  ? Years since quitting: 55.9  ?Smokeless Tobacco Never  ? ? ?Social History  ? ?Substance and Sexual Activity  ?Alcohol Use No  ? ? ?Family History  ?Problem Relation Age of Onset  ? Heart failure Mother 22  ? Heart attack Father 34  ? ? ?Review of Systems: ?As noted in HPI.   All other systems were reviewed and are negative. ? ?Physical Exam: ?BP (!) 100/56   Pulse (!) 122   Ht '5\' 1"'$  (1.549 m)   Wt 184 lb 12.8 oz (83.8 kg)   SpO2 98%   BMI 34.92 kg/m?  ? ?Body mass  index is 34.92 kg/m?. ?GENERAL:  Well appearing obese WF in NAD ?HEENT:  PERRL, EOMI, sclera are clear. Oropharynx is clear. ?NECK:  No jugular venous distention, carotid upstroke brisk and symmetric, no bruits, no thyromegaly or adenopathy ?LUNGS:  Clear to auscultation bilaterally ?CHEST:  Unremarkable ?HEART:  RRR- tachy.   PMI not displaced or sustained,S1 and S2 within normal limits, no S3, no S4: no clicks, no rubs, gr 1/6 systolic murmur at the apex>> RUSB ?ABD:  Soft, nontender. BS +, no masses or bruits. No hepatomegaly, no splenomegaly ?EXT:  2 + pulses throughout, tr edema, no cyanosis no clubbing ?SKIN:  Warm and dry.  No rashes ?NEURO:  Alert and oriented x 3. Cranial nerves II through XII intact. ?PSYCH:  Cognitively intact ? ? ? ?LABORATORY DATA: ? ? ?Lab Results  ?Component Value Date  ? WBC 7.4 03/22/2021  ? HGB 12.9 03/22/2021  ? HCT 39.3 03/22/2021  ? PLT 184 03/22/2021  ? GLUCOSE 130 (H) 03/22/2021  ? CHOL 145 03/22/2021  ? TRIG 172 (H) 03/22/2021  ? HDL 46 03/22/2021  ? [place] 70 03/22/2021  ? ALT 23 03/22/2021  ? AST 19 03/22/2021  ? NA 139 03/22/2021  ? K 4.8 03/22/2021  ? CL 105 03/22/2021  ? CREATININE 1.68 (H) 03/22/2021  ? BUN 35 (H) 03/22/2021  ? CO2 21 03/22/2021  ? TSH 7.730 (H) 03/22/2021  ? INR 1.03 07/06/2015  ? ?Labs dated 03/11/16: creatinine 1.6 ?Dated 07/27/16: cholesterol 192, triglycerides 165, HDL 52, LDL 107. Creatinine 1.27. Potassium and ALT normal.  ?Dated 02/07/17: BUN 27, creatinine 1.25. Glucose 113. Other chemistries normal. Cholesterol 199, triglycerides 212, HDL 44, LDL 113.  ?Dated 02/08/18: cholesterol 161, triglycerides 209, HDL 44, LDL 75. LFTs normal. ?Dated 10/13/19: A1c 6.7%. triglycerides 269, HDL 39. Unable to see other results in [place] ?Dated 01/13/20: BUN 33, creatinine 1.76. glucose 137. Potassium 5.1. sodium 144. A1c 6.4% ?Dated 12/03/20: cholesterol 151, triglycerides 185, HDL 42, LDL 78. A1c 6.5%. BUN 37, creatinine 1.67. otherwise CMET normal. ? ?Ecg today  shows atrial flutter with 2:1 AV conduction. Rate 122. LBBB. QTC 547 msec. I have personally reviewed and interpreted this study. ? ? ?Echo 02/03/16: Study Conclusions ?  ?- Left ventricle: The cavity size wa

## 2021-04-27 ENCOUNTER — Ambulatory Visit (INDEPENDENT_AMBULATORY_CARE_PROVIDER_SITE_OTHER): Payer: Medicare Other | Admitting: Cardiology

## 2021-04-27 ENCOUNTER — Encounter: Payer: Self-pay | Admitting: Cardiology

## 2021-04-27 VITALS — BP 100/56 | HR 122 | Ht 61.0 in | Wt 184.8 lb

## 2021-04-27 DIAGNOSIS — I4892 Unspecified atrial flutter: Secondary | ICD-10-CM | POA: Diagnosis not present

## 2021-04-27 DIAGNOSIS — Z952 Presence of prosthetic heart valve: Secondary | ICD-10-CM

## 2021-04-27 DIAGNOSIS — N1831 Chronic kidney disease, stage 3a: Secondary | ICD-10-CM

## 2021-04-27 DIAGNOSIS — I5043 Acute on chronic combined systolic (congestive) and diastolic (congestive) heart failure: Secondary | ICD-10-CM | POA: Diagnosis not present

## 2021-04-27 DIAGNOSIS — I421 Obstructive hypertrophic cardiomyopathy: Secondary | ICD-10-CM | POA: Diagnosis not present

## 2021-05-02 ENCOUNTER — Telehealth: Payer: Self-pay

## 2021-05-02 NOTE — Telephone Encounter (Signed)
Left detailed message with information for Dr. Lovena Le appt ? ?05/05/2021 at 12:15 pm ?

## 2021-05-02 NOTE — Telephone Encounter (Signed)
Appt confirmed

## 2021-05-05 ENCOUNTER — Ambulatory Visit (INDEPENDENT_AMBULATORY_CARE_PROVIDER_SITE_OTHER): Payer: Medicare Other | Admitting: Internal Medicine

## 2021-05-05 ENCOUNTER — Encounter: Payer: Self-pay | Admitting: Internal Medicine

## 2021-05-05 VITALS — BP 116/70 | Ht 61.0 in | Wt 183.0 lb

## 2021-05-05 DIAGNOSIS — I421 Obstructive hypertrophic cardiomyopathy: Secondary | ICD-10-CM | POA: Diagnosis not present

## 2021-05-05 DIAGNOSIS — I4892 Unspecified atrial flutter: Secondary | ICD-10-CM

## 2021-05-05 LAB — CBC WITH DIFFERENTIAL/PLATELET
Basophils Absolute: 0 10*3/uL (ref 0.0–0.2)
Basos: 0 %
EOS (ABSOLUTE): 0.1 10*3/uL (ref 0.0–0.4)
Eos: 2 %
Hematocrit: 40.8 % (ref 34.0–46.6)
Hemoglobin: 13.4 g/dL (ref 11.1–15.9)
Lymphocytes Absolute: 2.6 10*3/uL (ref 0.7–3.1)
Lymphs: 31 %
MCH: 31.8 pg (ref 26.6–33.0)
MCHC: 32.8 g/dL (ref 31.5–35.7)
MCV: 97 fL (ref 79–97)
Monocytes Absolute: 0.8 10*3/uL (ref 0.1–0.9)
Monocytes: 9 %
Neutrophils Absolute: 4.8 10*3/uL (ref 1.4–7.0)
Neutrophils: 58 %
Platelets: 237 10*3/uL (ref 150–450)
RBC: 4.21 x10E6/uL (ref 3.77–5.28)
RDW: 14.5 % (ref 11.7–15.4)
WBC: 8.3 10*3/uL (ref 3.4–10.8)

## 2021-05-05 LAB — BASIC METABOLIC PANEL
BUN/Creatinine Ratio: 19 (ref 12–28)
BUN: 34 mg/dL — ABNORMAL HIGH (ref 8–27)
CO2: 25 mmol/L (ref 20–29)
Calcium: 9.7 mg/dL (ref 8.7–10.3)
Chloride: 107 mmol/L — ABNORMAL HIGH (ref 96–106)
Creatinine, Ser: 1.8 mg/dL — ABNORMAL HIGH (ref 0.57–1.00)
Glucose: 97 mg/dL (ref 70–99)
Potassium: 5.3 mmol/L — ABNORMAL HIGH (ref 3.5–5.2)
Sodium: 141 mmol/L (ref 134–144)
eGFR: 27 mL/min/{1.73_m2} — ABNORMAL LOW (ref >59)

## 2021-05-05 NOTE — Patient Instructions (Addendum)
Medication Instructions:  ?Your physician recommends that you continue on your current medications as directed. Please refer to the Current Medication list given to you today. ? ?Labwork: ?You will get lab work today:  CBC and BMP ? ?Testing/Procedures: ?Your physician has recommended that you have an ablation. Catheter ablation is a medical procedure used to treat some cardiac arrhythmias (irregular heartbeats). During catheter ablation, a long, thin, flexible tube is put into a blood vessel in your groin (upper thigh), or neck. This tube is called an ablation catheter. It is then guided to your heart through the blood vessel. Radio frequency waves destroy small areas of heart tissue where abnormal heartbeats may cause an arrhythmia to start. Please see the instruction sheet given to you today. ? ?Your physician has recommended that you have a pacemaker inserted. A pacemaker is a small device that is placed under the skin of your chest or abdomen to help control abnormal heart rhythms. This device uses electrical pulses to prompt the heart to beat at a normal rate. Pacemakers are used to treat heart rhythms that are too slow. Wire (leads) are attached to the pacemaker that goes into the chambers of you heart. This is done in the hospital and usually requires and overnight stay. Please see the instruction sheet given to you today for more information. ? ?Follow-Up: ? ?SEE INSTRUCTION LETTER ? ?Any Other Special Instructions Will Be Listed Below (If Applicable). ? ?If you need a refill on your cardiac medications before your next appointment, please call your pharmacy.  ? ?Cardiac Ablation ?Cardiac ablation is a procedure to destroy, or ablate, a small amount of heart tissue in very specific places. The heart has many electrical connections. Sometimes these connections are abnormal and can cause the heart to beat very fast or irregularly. Ablating some of the areas that cause problems can improve the heart's rhythm  or return it to normal. Ablation may be done for people who: ?Have Wolff-Parkinson-White syndrome. ?Have fast heart rhythms (tachycardia). ?Have taken medicines for an abnormal heart rhythm (arrhythmia) that were not effective or caused side effects. ?Have a high-risk heartbeat that may be life-threatening. ?During the procedure, a small incision is made in the neck or the groin, and a long, thin tube (catheter) is inserted into the incision and moved to the heart. Small devices (electrodes) on the tip of the catheter will send out electrical currents. A type of X-ray (fluoroscopy) will be used to help guide the catheter and to provide images of the heart. ?Tell a health care provider about: ?Any allergies you have. ?All medicines you are taking, including vitamins, herbs, eye drops, creams, and over-the-counter medicines. ?Any problems you or family members have had with anesthetic medicines. ?Any blood disorders you have. ?Any surgeries you have had. ?Any medical conditions you have, such as kidney failure. ?Whether you are pregnant or may be pregnant. ?What are the risks? ?Generally, this is a safe procedure. However, problems may occur, including: ?Infection. ?Bruising and bleeding at the catheter insertion site. ?Bleeding into the chest, especially into the sac that surrounds the heart. This is a serious complication. ?Stroke or blood clots. ?Damage to nearby structures or organs. ?Allergic reaction to medicines or dyes. ?Need for a permanent pacemaker if the normal electrical system is damaged. A pacemaker is a small computer that sends electrical signals to the heart and helps your heart beat normally. ?The procedure not being fully effective. This may not be recognized until months later. Repeat ablation procedures are sometimes  done. ?What happens before the procedure? ?Medicines ?Ask your health care provider about: ?Changing or stopping your regular medicines. This is especially important if you are  taking diabetes medicines or blood thinners. ?Taking medicines such as aspirin and ibuprofen. These medicines can thin your blood. Do not take these medicines unless your health care provider tells you to take them. ?Taking over-the-counter medicines, vitamins, herbs, and supplements. ?General instructions ?Follow instructions from your health care provider about eating or drinking restrictions. ?Plan to have someone take you home from the hospital or clinic. ?If you will be going home right after the procedure, plan to have someone with you for 24 hours. ?Ask your health care provider what steps will be taken to prevent infection. ?What happens during the procedure? ? ?An IV will be inserted into one of your veins. ?You will be given a medicine to help you relax (sedative). ?The skin on your neck or groin will be numbed. ?An incision will be made in your neck or your groin. ?A needle will be inserted through the incision and into a large vein in your neck or groin. ?A catheter will be inserted into the needle and moved to your heart. ?Dye may be injected through the catheter to help your surgeon see the area of the heart that needs treatment. ?Electrical currents will be sent from the catheter to ablate heart tissue in desired areas. There are three types of energy that may be used to do this: ?Heat (radiofrequency energy). ?Laser energy. ?Extreme cold (cryoablation). ?When the tissue has been ablated, the catheter will be removed. ?Pressure will be held on the insertion area to prevent a lot of bleeding. ?A bandage (dressing) will be placed over the insertion area. ?The exact procedure may vary among health care providers and hospitals. ?What happens after the procedure? ?Your blood pressure, heart rate, breathing rate, and blood oxygen level will be monitored until you leave the hospital or clinic. ?Your insertion area will be monitored for bleeding. You will need to lie still for a few hours to ensure that you  do not bleed from the insertion area. ?Do not drive for 24 hours or as long as told by your health care provider. ?Summary ?Cardiac ablation is a procedure to destroy, or ablate, a small amount of heart tissue using an electrical current. This procedure can improve the heart rhythm or return it to normal. ?Tell your health care provider about any medical conditions you may have and all medicines you are taking to treat them. ?This is a safe procedure, but problems may occur. Problems may include infection, bruising, damage to nearby organs or structures, or allergic reactions to medicines. ?Follow your health care provider's instructions about eating and drinking before the procedure. You may also be told to change or stop some of your medicines. ?After the procedure, do not drive for 24 hours or as long as told by your health care provider. ?This information is not intended to replace advice given to you by your health care provider. Make sure you discuss any questions you have with your health care provider. ?Document Revised: 11/25/2018 Document Reviewed: 11/25/2018 ?Elsevier Patient Education ? [place]. ? ?Pacemaker Implantation, Adult ?Pacemaker implantation is a procedure to place a pacemaker inside the chest. A pacemaker is a small computer that sends electrical signals to the heart and helps the heart beat normally. A pacemaker also stores information about heart rhythms. You may need pacemaker implantation if you have: ?A slow heartbeat (bradycardia). ?Loss  of consciousness that happens repeatedly (syncope) or repeated episodes of dizziness or light-headedness because of an irregular heart rate. ?Shortness of breath (dyspnea) due to heart problems. ?The pacemaker usually attaches to your heart through a wire called a lead. One or two leads may be needed. There are different types of pacemakers: ?Transvenous pacemaker. This type is placed under the skin or muscle of your upper chest area. The lead  goes through a vein in the chest area to reach the inside of the heart. ?Epicardial pacemaker. This type is placed under the skin or muscle of your chest or abdomen. The lead goes through your chest to the o

## 2021-05-05 NOTE — Progress Notes (Signed)
? ? ? ? ?HPI ?Melinda Gonzales is referred back by Dr. Martinique. She is a pleasant 87 yo woman with HCM and an over 100 mmHg gradient who underwent left septal myomectomy with LBBB after the procedure. The patient has developed recurrent atypical atrial flutter and a RVR and her EF has gone from normal to decreased. She does not feel palpitations despite her RVR but does have less energy. 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 She has not had syncope and there is minimal leg swelling. ? ?Allergies  ?Allergen Reactions  ? Oxycodone Nausea And Vomiting  ?  And the sweats  ? Statins   ?  Myalgias tolerates crestor  ? ? ? ?Current Outpatient Medications  ?Medication Sig Dispense Refill  ? Apoaequorin (PREVAGEN) 10 MG CAPS Take 1 capsule by mouth daily.    ? carvedilol (COREG) 6.25 MG tablet TAKE 1 TABLET BY MOUTH TWICE A DAY 60 tablet 6  ? empagliflozin (JARDIANCE) 10 MG TABS tablet Take 1 tablet (10 mg total) by mouth daily before breakfast. 90 tablet 3  ? ENTRESTO 24-26 MG TAKE 1 TABLET BY MOUTH TWICE A DAY 60 tablet 10  ? furosemide (LASIX) 40 MG tablet TAKE 1 TABLET BY MOUTH DAILY 30 tablet 8  ? latanoprost (XALATAN) 0.005 % ophthalmic solution Place 1 drop into both eyes at bedtime.    ? Multiple Vitamins-Minerals (PRESERVISION AREDS 2) CAPS Take 1 capsule by mouth daily.    ? potassium chloride SA (KLOR-CON) 20 MEQ tablet TAKE 1 TABLET BY MOUTH DAILY 30 tablet 6  ? Rivaroxaban (XARELTO) 15 MG TABS tablet TAKE 1 TABLET BY MOUTH DAILY WITH SUPPER 30 tablet 5  ? rosuvastatin (CRESTOR) 10 MG tablet TAKE 1 TABLET (10 MG TOTAL) BY MOUTH DAILY. 30 tablet 6  ? ?No current facility-administered medications for this visit.  ? ? ? ?Past Medical History:  ?Diagnosis Date  ? Chronic diastolic CHF (congestive heart failure) ([place]) 07/06/2015  ? Dyspnea   ? Heart murmur   ? Hypercholesterolemia   ? Hypertrophic obstructive  cardiomyopathy (HOCM) ([place])   ? LVH (left ventricular hypertrophy)   ? WITH NORMAL SYSTOLIC FUNCTION  ? SOB (shortness of breath)   ? ? ?ROS: ? ? All systems reviewed and negative except as noted in the HPI. ? ? ?Past Surgical History:  ?Procedure Laterality Date  ? CARDIAC CATHETERIZATION N/A 07/06/2015  ? Procedure: Right/Left Heart Cath and Coronary Angiography;  Surgeon: Peter M Martinique, MD;  Location: [place] [facility];  Service: Cardiovascular;  Laterality: N/A;  ? CARDIOVERSION N/A 10/23/2018  ? Procedure: CARDIOVERSION;  Surgeon: Donato Heinz, MD;  Location: [facility] ENDOSCOPY;  Service: Endoscopy;  Laterality: N/A;  ? CARDIOVERSION N/A 03/29/2021  ? Procedure: CARDIOVERSION;  Surgeon: Sanda Klein, MD;  Location: [place] ENDOSCOPY;  Service: Cardiovascular;  Laterality: N/A;  ? MITRAL VALVE REPLACEMENT N/A 11/15/2015  ? ligation of left atrial appendage, maze procedure at [facility]  ? ? ? ?Family History  ?Problem Relation Age of Onset  ? Heart failure Mother 72  ? Heart attack Father 43  ? ? ? ?Social History  ? ?Socioeconomic History  ? Marital status: Married  ?  Spouse name: Not on file  ? Number of children: 2  ? Years of education: Not on file  ? Highest education level: Not on file  ?Occupational History  ?  Employer: RETIRED  ?  Comment: retired  ?Tobacco Use  ? Smoking status: Former  ?  Types: Cigarettes  ?  Quit date: 05/26/1965  ?  Years since quitting: 55.9  ? Smokeless tobacco: Never  ?Vaping Use  ? Vaping Use: Never used  ?Substance and Sexual Activity  ? Alcohol use: No  ? Drug use: No  ? Sexual activity: Not on file  ?Other Topics Concern  ? Not on file  ?Social History Narrative  ? Not on file  ? ?Social Determinants of Health  ? ?Financial Resource Strain: Not on file  ?Food Insecurity: Not on file  ?Transportation Needs: Not on file  ?Physical Activity: Not on file  ?Stress: Not on file  ?Social Connections: Not on file  ?Intimate Partner Violence: Not on file  ? ? ? ?BP 116/70    Ht '5\' 1"'$  (1.549 m)   Wt 183 lb (83 kg)   SpO2 95%   BMI 34.58 kg/m?  ? ?Physical Exam: ? ?Well appearing NAD ?HEENT: Unremarkable ?Neck:  No JVD, no thyromegally ?Lymphatics:  No adenopathy ?Back:  No CVA tenderness ?Lungs:  Clear with no wheezes ?HEART:  Regular rate rhythm, no murmurs, no rubs, no clicks ?Abd:  soft, positive bowel sounds, no organomegally, no rebound, no guarding ?Ext:  2 plus pulses, no edema, no cyanosis, no clubbing ?Skin:  No rashes no nodules ?Neuro:  CN II through XII intact, motor grossly intact ? ?EKG - reviewed. Wide QRS tachy with LBBB. ? ? ?Assess/Plan: . ?Atypical atrial flutter - she is s/p MAZE and now with recurrent and uncontrolled atrial flutter. I discussed the treatment options and recommended AV node ablation and PPM insertion. The risks/benefits/goals/expectations were reviewe dn she will call us if she wishes to proceed. ?Chronic systolic heart failure - her symptoms are thought to be tachy mediated. She will continue medical therapy. I will place a left bundle area lead. ?HCM - she is s/p myomectomy and her only symptom is fatigue. ? ?Carleene Overlie Taylor,MD ?

## 2021-05-09 NOTE — Pre-Procedure Instructions (Signed)
Instructed patient on the following items: ?Arrival time 0800 ?Nothing to eat or drink after midnight ?No meds AM of procedure ?Responsible person to drive you home and stay with you for 24 hrs ?Wash with special soap night before and morning of procedure ?If on anti-coagulant drug instructions Xarelto- last dose 4/8  ?

## 2021-05-10 ENCOUNTER — Ambulatory Visit (HOSPITAL_COMMUNITY)
Admission: RE | Admit: 2021-05-10 | Discharge: 2021-05-11 | Disposition: A | Discharge status: Home / Self Care | Payer: Medicare Other | Attending: Internal Medicine | Admitting: Internal Medicine

## 2021-05-10 ENCOUNTER — Ambulatory Visit (HOSPITAL_COMMUNITY)
Admission: RE | Disposition: A | Discharge status: Home / Self Care | Payer: Self-pay | Source: Home / Self Care | Attending: Internal Medicine

## 2021-05-10 ENCOUNTER — Other Ambulatory Visit: Payer: Self-pay

## 2021-05-10 ENCOUNTER — Encounter (HOSPITAL_COMMUNITY): Payer: Self-pay | Admitting: Internal Medicine

## 2021-05-10 DIAGNOSIS — Z952 Presence of prosthetic heart valve: Secondary | ICD-10-CM | POA: Insufficient documentation

## 2021-05-10 DIAGNOSIS — I5022 Chronic systolic (congestive) heart failure: Secondary | ICD-10-CM | POA: Diagnosis not present

## 2021-05-10 DIAGNOSIS — I272 Pulmonary hypertension, unspecified: Secondary | ICD-10-CM | POA: Insufficient documentation

## 2021-05-10 DIAGNOSIS — I4821 Permanent atrial fibrillation: Secondary | ICD-10-CM | POA: Diagnosis not present

## 2021-05-10 DIAGNOSIS — I447 Left bundle-branch block, unspecified: Secondary | ICD-10-CM | POA: Insufficient documentation

## 2021-05-10 DIAGNOSIS — Z7901 Long term (current) use of anticoagulants: Secondary | ICD-10-CM | POA: Insufficient documentation

## 2021-05-10 DIAGNOSIS — I484 Atypical atrial flutter: Secondary | ICD-10-CM

## 2021-05-10 DIAGNOSIS — I421 Obstructive hypertrophic cardiomyopathy: Secondary | ICD-10-CM | POA: Diagnosis not present

## 2021-05-10 DIAGNOSIS — I4892 Unspecified atrial flutter: Secondary | ICD-10-CM | POA: Diagnosis present

## 2021-05-10 HISTORY — PX: PACEMAKER IMPLANT: EP1218

## 2021-05-10 HISTORY — PX: AV NODE ABLATION: EP1193

## 2021-05-10 SURGERY — AV NODE ABLATION
Anesthesia: LOCAL

## 2021-05-10 MED ORDER — HEPARIN (PORCINE) IN NACL 1000-0.9 UT/500ML-% IV SOLN
INTRAVENOUS | Status: AC
  Filled 2021-05-10: qty 500

## 2021-05-10 MED ORDER — FUROSEMIDE 40 MG PO TABS
40.0000 mg | ORAL_TABLET | Freq: Every day | ORAL | Status: DC
  Administered 2021-05-10 – 2021-05-11 (×2): 40 mg via ORAL
  Filled 2021-05-10 (×2): qty 1

## 2021-05-10 MED ORDER — ACETAMINOPHEN 325 MG PO TABS
325.0000 mg | ORAL_TABLET | ORAL | Status: DC | PRN
  Administered 2021-05-10: 650 mg via ORAL
  Filled 2021-05-10: qty 2

## 2021-05-10 MED ORDER — CEFAZOLIN SODIUM-DEXTROSE 2-4 GM/100ML-% IV SOLN
2.0000 g | INTRAVENOUS | Status: AC
  Administered 2021-05-10: 2 g via INTRAVENOUS

## 2021-05-10 MED ORDER — LIDOCAINE HCL (PF) 1 % IJ SOLN
INTRAMUSCULAR | Status: DC | PRN
  Administered 2021-05-10: 50 mL

## 2021-05-10 MED ORDER — SODIUM CHLORIDE 0.9 % IV SOLN
80.0000 mg | INTRAVENOUS | Status: AC
  Administered 2021-05-10: 80 mg

## 2021-05-10 MED ORDER — ONDANSETRON HCL 4 MG/2ML IJ SOLN: 4.0000 mg | Freq: Four times a day (QID) | INTRAMUSCULAR | Status: DC | PRN

## 2021-05-10 MED ORDER — CEFAZOLIN SODIUM-DEXTROSE 1-4 GM/50ML-% IV SOLN
1.0000 g | Freq: Two times a day (BID) | INTRAVENOUS | Status: AC
  Administered 2021-05-11: 1 g via INTRAVENOUS
  Filled 2021-05-10 (×2): qty 50

## 2021-05-10 MED ORDER — MIDAZOLAM HCL 5 MG/5ML IJ SOLN
INTRAMUSCULAR | Status: DC | PRN
  Administered 2021-05-10 (×5): 1 mg via INTRAVENOUS

## 2021-05-10 MED ORDER — LIDOCAINE HCL 1 % IJ SOLN
INTRAMUSCULAR | Status: AC
  Filled 2021-05-10: qty 60

## 2021-05-10 MED ORDER — CEFAZOLIN SODIUM-DEXTROSE 2-4 GM/100ML-% IV SOLN
INTRAVENOUS | Status: AC
  Filled 2021-05-10: qty 100

## 2021-05-10 MED ORDER — IOHEXOL 350 MG/ML SOLN
INTRAVENOUS | Status: DC | PRN
  Administered 2021-05-10 (×2): 15 mL

## 2021-05-10 MED ORDER — HEPARIN (PORCINE) IN NACL 1000-0.9 UT/500ML-% IV SOLN
INTRAVENOUS | Status: DC | PRN
  Administered 2021-05-10 (×2): 500 mL

## 2021-05-10 MED ORDER — BUPIVACAINE HCL (PF) 0.25 % IJ SOLN
INTRAMUSCULAR | Status: AC
  Filled 2021-05-10: qty 30

## 2021-05-10 MED ORDER — SODIUM CHLORIDE 0.9 % IV SOLN: INTRAVENOUS | Status: DC

## 2021-05-10 MED ORDER — FENTANYL CITRATE (PF) 100 MCG/2ML IJ SOLN
INTRAMUSCULAR | Status: AC
  Filled 2021-05-10: qty 2

## 2021-05-10 MED ORDER — MIDAZOLAM HCL 5 MG/5ML IJ SOLN
INTRAMUSCULAR | Status: AC
  Filled 2021-05-10: qty 5

## 2021-05-10 MED ORDER — POVIDONE-IODINE 10 % EX SWAB: 2.0000 "application " | Freq: Once | CUTANEOUS | Status: DC

## 2021-05-10 MED ORDER — CARVEDILOL 6.25 MG PO TABS
6.2500 mg | ORAL_TABLET | Freq: Two times a day (BID) | ORAL | Status: DC
  Administered 2021-05-10 – 2021-05-11 (×2): 6.25 mg via ORAL
  Filled 2021-05-10 (×2): qty 1

## 2021-05-10 MED ORDER — HYDRALAZINE HCL 20 MG/ML IJ SOLN
10.0000 mg | Freq: Four times a day (QID) | INTRAMUSCULAR | Status: DC | PRN
  Administered 2021-05-10: 10 mg via INTRAVENOUS
  Filled 2021-05-10: qty 1

## 2021-05-10 MED ORDER — SACUBITRIL-VALSARTAN 24-26 MG PO TABS
1.0000 | ORAL_TABLET | Freq: Two times a day (BID) | ORAL | Status: DC
  Administered 2021-05-10 – 2021-05-11 (×2): 1 via ORAL
  Filled 2021-05-10 (×2): qty 1

## 2021-05-10 MED ORDER — FENTANYL CITRATE (PF) 100 MCG/2ML IJ SOLN
INTRAMUSCULAR | Status: DC | PRN
  Administered 2021-05-10 (×5): 12.5 ug via INTRAVENOUS

## 2021-05-10 MED ORDER — SODIUM CHLORIDE 0.9 % IV SOLN
INTRAVENOUS | Status: AC
  Filled 2021-05-10: qty 2

## 2021-05-10 MED ORDER — CHLORHEXIDINE GLUCONATE 4 % EX LIQD
4.0000 | Freq: Once | CUTANEOUS | Status: DC
Start: 2021-05-10 — End: 2021-05-10

## 2021-05-10 SURGICAL SUPPLY — 17 items
CABLE SURGICAL S-101-97-12 (CABLE) ×2 IMPLANT
CATH CELSIUS THERMO F CV 7FR (ABLATOR) ×1 IMPLANT
CATH CPS LOCATOR 3D MED (CATHETERS) ×1 IMPLANT
HELIX LOCKING TOOL (MISCELLANEOUS) ×2
LEAD TENDRIL MRI 52CM LPA1200M (Lead) ×1 IMPLANT
LEAD TENDRIL SDX 2088TC-58CM (Lead) ×1 IMPLANT
PACEMAKER ASSURITY DR-RF (Pacemaker) ×1 IMPLANT
PACK EP LATEX FREE (CUSTOM PROCEDURE TRAY) ×2
PACK EP LF (CUSTOM PROCEDURE TRAY) ×1 IMPLANT
PAD DEFIB RADIO PHYSIO CONN (PAD) ×2 IMPLANT
SHEATH 8FR PRELUDE SNAP 13 (SHEATH) ×1 IMPLANT
SHEATH 9FR PRELUDE SNAP 13 (SHEATH) ×1 IMPLANT
SHEATH PINNACLE 8F 10CM (SHEATH) ×1 IMPLANT
TOOL HELIX LOCKING (MISCELLANEOUS) IMPLANT
TRAY PACEMAKER INSERTION (PACKS) ×2 IMPLANT
WIRE GUIDERIGHT .032X150 (WIRE) ×1 IMPLANT
WIRE MICRO SET SILHO 5FR 7 (SHEATH) ×1 IMPLANT

## 2021-05-10 NOTE — Progress Notes (Signed)
SITE AREA: right groin/femoral ? ?SITE PRIOR TO REMOVAL:  LEVEL 0 ? ?PRESSURE APPLIED FOR: approximately 10 minutes ? ?MANUAL: yes ? ?PATIENT STATUS DURING PULL: stable ? ?POST PULL SITE:  LEVEL 0 ? ?POST PULL INSTRUCTIONS GIVEN: yes ? ?POST PULL PULSES PRESENT: +1 right pedal pulse, palpable ? ?DRESSING APPLIED: gauze with tegaderm ? ?BEDREST BEGINS @ 1225 ? ?COMMENTS:  ?

## 2021-05-10 NOTE — Discharge Instructions (Addendum)
Post procedure care instructions (groin site) ?No lifting over 5 lbs for 1 week. No vigorous or sexual activity for 1 week. Keep procedure site clean & dry. If you notice increased pain, swelling, bleeding or pus, call/return!  You may shower after 24 hours, but no soaking in baths/hot tubs/pools for 1 week.  ? ?PLEASE also note instructions below ? ? ? ? ?Supplemental Discharge Instructions for  ?Pacemaker/Defibrillator Patients ? ? ?Activity ?No heavy lifting or vigorous activity with your left/right arm for 6 to 8 weeks.  Do not raise your left/right arm above your head for one week.  Gradually raise your affected arm as drawn below. ? ?        ?    05/15/21                    05/16/21                   05/17/21                   05/18/21 ?__ ? ?NO DRIVING until cleared to at your wound check visit. ? ?WOUND CARE ?Keep the wound area clean and dry.  Do not get this area wet , no showers until cleared to at your wound check ?The tape/steri-strips on your wound will fall off; do not pull them off.  No bandage is needed on the site.  DO  NOT apply any creams, oils, or ointments to the wound area. ?If you notice any drainage or discharge from the wound, any swelling or bruising at the site, or you develop a fever > 101? F after you are discharged home, call the office at once. ? ?Special Instructions ?You are still able to use cellular telephones; use the ear opposite the side where you have your pacemaker/defibrillator.  Avoid carrying your cellular phone near your device. ?When traveling through airports, show security personnel your identification card to avoid being screened in the metal detectors.  Ask the security personnel to use the hand wand. ?Avoid arc welding equipment, MRI testing (magnetic resonance imaging), TENS units (transcutaneous nerve stimulators).  Call the office for questions about other devices. ?Avoid electrical appliances that are in poor condition or are not properly grounded. ?Microwave ovens  are safe to be near or to operate. ? ? ?

## 2021-05-10 NOTE — H&P (Signed)
?  ?  ?  ?HPI ?Melinda Gonzales is referred back by Dr. Martinique. She is a pleasant 87 yo woman with HCM and an over 100 mmHg gradient who underwent left septal myomectomy with LBBB after the procedure. The patient has developed recurrent atypical atrial flutter and a RVR and her EF has gone from normal to decreased. She does not feel palpitations despite her RVR but does have less energy. 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 She has not had syncope and there is minimal leg swelling. ?  ?     ?Allergies  ?Allergen Reactions  ? Oxycodone Nausea And Vomiting  ?    And the sweats  ? Statins    ?    Myalgias tolerates crestor  ?  ?  ?  ?      ?Current Outpatient Medications  ?Medication Sig Dispense Refill  ? Apoaequorin (PREVAGEN) 10 MG CAPS Take 1 capsule by mouth daily.      ? carvedilol (COREG) 6.25 MG tablet TAKE 1 TABLET BY MOUTH TWICE A DAY 60 tablet 6  ? empagliflozin (JARDIANCE) 10 MG TABS tablet Take 1 tablet (10 mg total) by mouth daily before breakfast. 90 tablet 3  ? ENTRESTO 24-26 MG TAKE 1 TABLET BY MOUTH TWICE A DAY 60 tablet 10  ? furosemide (LASIX) 40 MG tablet TAKE 1 TABLET BY MOUTH DAILY 30 tablet 8  ? latanoprost (XALATAN) 0.005 % ophthalmic solution Place 1 drop into both eyes at bedtime.      ? Multiple Vitamins-Minerals (PRESERVISION AREDS 2) CAPS Take 1 capsule by mouth daily.      ? potassium chloride SA (KLOR-CON) 20 MEQ tablet TAKE 1 TABLET BY MOUTH DAILY 30 tablet 6  ? Rivaroxaban (XARELTO) 15 MG TABS tablet TAKE 1 TABLET BY MOUTH DAILY WITH SUPPER 30 tablet 5  ? rosuvastatin (CRESTOR) 10 MG tablet TAKE 1 TABLET (10 MG TOTAL) BY MOUTH DAILY. 30 tablet 6  ?  ?No current facility-administered medications for this visit.  ?  ?  ?  ?    ?Past Medical History:  ?Diagnosis Date  ? Chronic diastolic CHF (congestive heart failure) ([place]) 07/06/2015  ? Dyspnea    ? Heart murmur    ?  Hypercholesterolemia    ? Hypertrophic obstructive cardiomyopathy (HOCM) ([place])    ? LVH (left ventricular hypertrophy)    ?  WITH NORMAL SYSTOLIC FUNCTION  ? SOB (shortness of breath)    ?  ?  ?ROS: ?  ? All systems reviewed and negative except as noted in the HPI. ?  ?  ?     ?Past Surgical History:  ?Procedure Laterality Date  ? CARDIAC CATHETERIZATION N/A 07/06/2015  ?  Procedure: Right/Left Heart Cath and Coronary Angiography;  Surgeon: Peter M Martinique, MD;  Location: [place] [facility];  Service: Cardiovascular;  Laterality: N/A;  ? CARDIOVERSION N/A 10/23/2018  ?  Procedure: CARDIOVERSION;  Surgeon: Donato Heinz, MD;  Location: [facility];  Service: Endoscopy;  Laterality: N/A;  ? CARDIOVERSION N/A 03/29/2021  ?  Procedure: CARDIOVERSION;  Surgeon: Sanda Klein, MD;  Location: [place] ENDOSCOPY;  Service: Cardiovascular;  Laterality: N/A;  ? MITRAL VALVE REPLACEMENT N/A 11/15/2015  ?  ligation of left atrial appendage, maze procedure at [facility]  ?  ?  ?  ?     ?Family History  ?Problem Relation Age of Onset  ? Heart  failure Mother 60  ? Heart attack Father 76  ?  ?  ?  ?Social History  ?  ?     ?Socioeconomic History  ? Marital status: Married  ?    Spouse name: Not on file  ? Number of children: 2  ? Years of education: Not on file  ? Highest education level: Not on file  ?Occupational History  ?    Employer: RETIRED  ?    Comment: retired  ?Tobacco Use  ? Smoking status: Former  ?    Types: Cigarettes  ?    Quit date: 05/26/1965  ?    Years since quitting: 55.9  ? Smokeless tobacco: Never  ?Vaping Use  ? Vaping Use: Never used  ?Substance and Sexual Activity  ? Alcohol use: No  ? Drug use: No  ? Sexual activity: Not on file  ?Other Topics Concern  ? Not on file  ?Social History Narrative  ? Not on file  ?  ?Social Determinants of Health  ?  ?Financial Resource Strain: Not on file  ?Food Insecurity: Not on file  ?Transportation Needs: Not on file  ?Physical Activity: Not on file  ?Stress: Not on  file  ?Social Connections: Not on file  ?Intimate Partner Violence: Not on file  ?  ?  ?  ?BP 116/70   Ht '5\' 1"'$  (1.549 m)   Wt 183 lb (83 kg)   SpO2 95%   BMI 34.58 kg/m?  ?  ?Physical Exam: ?  ?Well appearing NAD ?HEENT: Unremarkable ?Neck:  No JVD, no thyromegally ?Lymphatics:  No adenopathy ?Back:  No CVA tenderness ?Lungs:  Clear with no wheezes ?HEART:  Regular rate rhythm, no murmurs, no rubs, no clicks ?Abd:  soft, positive bowel sounds, no organomegally, no rebound, no guarding ?Ext:  2 plus pulses, no edema, no cyanosis, no clubbing ?Skin:  No rashes no nodules ?Neuro:  CN II through XII intact, motor grossly intact ?  ?EKG - reviewed. Wide QRS tachy with LBBB. ?  ?  ?Assess/Plan: . ?Atypical atrial flutter - she is s/p MAZE and now with recurrent and uncontrolled atrial flutter. I discussed the treatment options and recommended AV node ablation and PPM insertion. The risks/benefits/goals/expectations were reviewe dn she will call us if she wishes to proceed. ?Chronic systolic heart failure - her symptoms are thought to be tachy mediated. She will continue medical therapy. I will place a left bundle area lead. ?HCM - she is s/p myomectomy and her only symptom is fatigue. ?  ?Carleene Overlie Kaleigha Chamberlin,MD ?

## 2021-05-10 NOTE — Discharge Summary (Addendum)
? ? ? ?ELECTROPHYSIOLOGY PROCEDURE DISCHARGE SUMMARY  ? ? ?Patient ID: Melinda Gonzales,  ?MRN: [id number], DOB/AGE: Apr 27, 1934 87 y.o. ? ?Admit date: 05/10/2021 ?Discharge date: 05/11/21 ? ?Primary Care Physician: Kelton Pillar, MD  ?Primary Cardiologist: Dr. Martinique ?Electrophysiologist: Dr. Lovena Le ? ?Primary Discharge Diagnosis:  ?Permanent Afib, poorly controlled despite medications ?Prior Failure of AAD ?CHA2DS2Vasc is 4, on Xarelto (appropriately dosed) ?S/p PPM and AV Node ablation this admission ? ? ?Secondary Discharge Diagnosis:  ?HOCM ?S/p septal myectomy on 11/05/15  ?VHD ?S/p MVReplacement 11/05/2015 ?As well as MAZE and LAA ligation ?LBBB ?VHD ?Chronic CHF (systolic) ?P.HTN ? ?Allergies  ?Allergen Reactions  ? Oxycodone Nausea And Vomiting  ?  And the sweats  ? Statins   ?  Myalgias tolerates crestor  ? ? ? ?Procedures This Admission:  ?1.  Implantation of a Abbott dual chamber PPM on 05/10/21 by Dr Lovena Le.  The patient received  St. Jude (serial number [id number]) pacemaker,  St. Jude (serial number [id number]) right atrial lead and a St. Jude (serial number [id number]) right ventricular lead  ?There were no immediate post procedure complications. ?CXR on 05/11/21 demonstrated no pneumothorax status post device implantation.  ?2. AV node ablation post pacer implant, 05/10/21 ? ?Brief HPI: ?Melinda Gonzales is a 87 y.o. female was referred to electrophysiology in the outpatient setting for evaluation of her AFib/flutter, after failure of amiodarone.  Past medical history includes above.  Pace and ablate felt to be her only option going forward if needed  Risks, benefits, and alternatives to PPM/AV node ablation were reviewed with the patient who wished to proceed.  ? ?Hospital Course:  ?The patient was admitted and underwent implantation of a PPM and AV node ablation with details as outlined above.  She was monitored on telemetry overnight which demonstrated AV pacing and SR/V pacing.  Left chest was without  hematoma or ecchymosis.  R gorin site also stable without hematoma, bleeding, bruits, or tenderness.  The device was interrogated and found to be functioning normally.  CXR was obtained and demonstrated no pneumothorax status post device implantation.  Wound care, arm mobility, and restrictions were reviewed with the patient.  The patient feels well, denies any CP/SOB, with minimal pacer site discomfort.  She was examined by Dr. Curt Bears and considered stable for discharge to home.  ? ?Resume Xarelto 05/15/21 ? ? ?Physical Exam: ?Vitals:  ? 05/11/21 0000 05/11/21 0400 05/11/21 0814 05/11/21 1004  ?BP: 112/89 120/80 (!) 180/68 140/67  ?Pulse:  60 64   ?Resp: '20 20 17 18  '$ ?Temp: 98.5 ?F (36.9 ?C) 98.5 ?F (36.9 ?C) 98.1 ?F (36.7 ?C)   ?TempSrc: Oral Oral Oral   ?SpO2:   98%   ?Weight:      ?Height:      ? ? ?GEN- The patient is well appearing, alert and oriented x 3 today.   ?HEENT: normocephalic, atraumatic; sclera clear, conjunctiva pink; hearing intact; oropharynx clear; neck supple, no JVP ?Lungs- CTA b/l, normal work of breathing.  No wheezes, rales, rhonchi ?Heart-  no murmurs, rubs or gallops, PMI not laterally displaced ?GI- soft, non-tender, non-distended ?Extremities- no clubbing, cyanosis, or edema, R groin is soft, nontender, no hematoma or bruit ?MS- no significant deformity or atrophy ?Skin- warm and dry, no rash or lesion, left chest without hematoma/ecchymosis ?Psych- euthymic mood, full affect ?Neuro- no gross deficits ? ? ?Labs: ?  ?Lab Results  ?Component Value Date  ? WBC 8.3 05/05/2021  ? HGB 13.4 05/05/2021  ? HCT  40.8 05/05/2021  ? MCV 97 05/05/2021  ? PLT 237 05/05/2021  ?  ?Recent Labs  ?Lab 05/11/21 ?2820  ?NA 140  ?K 3.7  ?CL 107  ?CO2 24  ?BUN 23  ?CREATININE 1.51*  ?CALCIUM 8.9  ?GLUCOSE 123*  ? ? ?Discharge Medications:  ?Allergies as of 05/11/2021   ? ?   Reactions  ? Oxycodone Nausea And Vomiting  ? And the sweats  ? Statins   ? Myalgias tolerates crestor  ? ?  ? ?  ?Medication List  ?   ? ?TAKE these medications   ? ?carvedilol 6.25 MG tablet ?Commonly known as: COREG ?TAKE 1 TABLET BY MOUTH TWICE A DAY ?  ?empagliflozin 10 MG Tabs tablet ?Commonly known as: Jardiance ?Take 1 tablet (10 mg total) by mouth daily before breakfast. ?  ?Entresto 24-26 MG ?Generic drug: sacubitril-valsartan ?TAKE 1 TABLET BY MOUTH TWICE A DAY ?  ?furosemide 40 MG tablet ?Commonly known as: LASIX ?TAKE 1 TABLET BY MOUTH DAILY ?  ?latanoprost 0.005 % ophthalmic solution ?Commonly known as: XALATAN ?Place 1 drop into both eyes at bedtime. ?  ?potassium chloride SA 20 MEQ tablet ?Commonly known as: KLOR-CON M ?TAKE 1 TABLET BY MOUTH DAILY ?  ?PreserVision AREDS 2 Caps ?Take 1 capsule by mouth 2 (two) times daily. ?  ?Prevagen 10 MG Caps ?Generic drug: Apoaequorin ?Take 10 mg by mouth daily. ?  ?rosuvastatin 10 MG tablet ?Commonly known as: CRESTOR ?TAKE 1 TABLET (10 MG TOTAL) BY MOUTH DAILY. ?  ?Salonpas Pain Relief Patch Ptch ?Apply 1 packet topically daily as needed (pain). ?  ?Xarelto 15 MG Tabs tablet ?Generic drug: Rivaroxaban ?TAKE 1 TABLET BY MOUTH DAILY WITH SUPPER ?Notes to patient: Resume 05/15/21 ?  ? ?  ? ? ?Disposition: Home ?Discharge Instructions   ? ? Diet - low sodium heart healthy   Complete by: As directed ?  ? Increase activity slowly   Complete by: As directed ?  ? ?  ? ? ? ?Duration of Discharge Encounter: Greater than 30 minutes including physician time. ? ?Signed, ?Tommye Standard, PA-C ?05/11/2021 ?10:32 AM ? ? ?I have seen and examined this patient with Tommye Standard.  Agree with above, note added to reflect my findings.  On exam, RRR, no murmurs, lungs clear.  She is now status post Abbott dual-chamber pacemaker and AV node ablation for atrial fibrillation.  Device functioning appropriately.  Chest x-ray and interrogation without issue.  Plan for discharge today with follow-up in device clinic. ? ?Authur Cubit M. Verle Wheeling MD ?05/11/2021 ?6:37 PM ? ?

## 2021-05-10 NOTE — Progress Notes (Signed)
?  Transition of Care (TOC) Screening Note ? ? ?Patient Details  ?Name: Melinda Gonzales ?Date of Birth: 1934-02-19 ? ? ?Transition of Care (TOC) CM/SW Contact:    ?Pollie Friar, RN ?Phone Number: ?05/10/2021, 3:07 PM ? ? ? ?Transition of Care Department [facility]) has reviewed patient and no TOC needs have been identified at this time. We will continue to monitor patient advancement through interdisciplinary progression rounds. If new patient transition needs arise, please place a TOC consult. ?  ?

## 2021-05-10 NOTE — Plan of Care (Signed)

## 2021-05-11 ENCOUNTER — Telehealth: Payer: Self-pay | Admitting: Internal Medicine

## 2021-05-11 ENCOUNTER — Ambulatory Visit (HOSPITAL_COMMUNITY): Payer: Medicare Other

## 2021-05-11 DIAGNOSIS — Z950 Presence of cardiac pacemaker: Secondary | ICD-10-CM | POA: Diagnosis not present

## 2021-05-11 DIAGNOSIS — Z7901 Long term (current) use of anticoagulants: Secondary | ICD-10-CM | POA: Diagnosis not present

## 2021-05-11 DIAGNOSIS — Z952 Presence of prosthetic heart valve: Secondary | ICD-10-CM | POA: Diagnosis not present

## 2021-05-11 DIAGNOSIS — I5022 Chronic systolic (congestive) heart failure: Secondary | ICD-10-CM | POA: Diagnosis not present

## 2021-05-11 DIAGNOSIS — I421 Obstructive hypertrophic cardiomyopathy: Secondary | ICD-10-CM | POA: Diagnosis not present

## 2021-05-11 DIAGNOSIS — I484 Atypical atrial flutter: Secondary | ICD-10-CM | POA: Diagnosis not present

## 2021-05-11 DIAGNOSIS — I447 Left bundle-branch block, unspecified: Secondary | ICD-10-CM | POA: Diagnosis not present

## 2021-05-11 DIAGNOSIS — I272 Pulmonary hypertension, unspecified: Secondary | ICD-10-CM | POA: Diagnosis not present

## 2021-05-11 DIAGNOSIS — I4821 Permanent atrial fibrillation: Secondary | ICD-10-CM | POA: Diagnosis not present

## 2021-05-11 LAB — BASIC METABOLIC PANEL
Anion gap: 9 (ref 5–15)
BUN: 23 mg/dL (ref 8–23)
CO2: 24 mmol/L (ref 22–32)
Calcium: 8.9 mg/dL (ref 8.9–10.3)
Chloride: 107 mmol/L (ref 98–111)
Creatinine, Ser: 1.51 mg/dL — ABNORMAL HIGH (ref 0.44–1.00)
GFR, Estimated: 34 mL/min — ABNORMAL LOW (ref >60)
Glucose, Bld: 123 mg/dL — ABNORMAL HIGH (ref 70–99)
Potassium: 3.7 mmol/L (ref 3.5–5.1)
Sodium: 140 mmol/L (ref 135–145)

## 2021-05-11 MED FILL — Bupivacaine HCl Preservative Free (PF) Inj 0.25%: INTRAMUSCULAR | Qty: 30 | Status: AC

## 2021-05-11 NOTE — Plan of Care (Signed)

## 2021-05-11 NOTE — Telephone Encounter (Signed)
Pt c/o medication issue: ? ?1. Name of Medication: Xarelto- patient had to stop it for procedure, which she had on 05-10-21 ? ?2. How are you currently taking this medication (dosage and times per day)?  ? ?3. Are you having a reaction (difficulty breathing--STAT)?  ? ?4. What is your medication issue? When does she start back on her Xarelto ? ? ?

## 2021-05-11 NOTE — Telephone Encounter (Signed)
Pt's husband wanted to  make sure okay to restart Xarelto on 4/16 as stated in discharge summary Will forward to Dr Lovena Le for review ./cy ?

## 2021-05-11 NOTE — TOC Transition Note (Signed)
Transition of Care (TOC) - CM/SW Discharge Note ? ? ?Patient Details  ?Name: [name] ?MRN: [id number] ?Date of Birth: Jul 03, 1934 ? ?Transition of Care (TOC) CM/SW Contact:  ?Pollie Friar, RN ?Phone Number: ?05/11/2021, 10:56 AM ? ? ?Clinical Narrative:    ?Patient is discharging home with self care. No needs per TOC.  ? ? ?Final next level of care: Home/Self Care ?Barriers to Discharge: No Barriers Identified ? ? ?Patient Goals and CMS Choice ?  ?  ?  ? ?Discharge Placement ?  ?           ?  ?  ?  ?  ? ?Discharge Plan and Services ?  ?  ?           ?  ?  ?  ?  ?  ?  ?  ?  ?  ?  ? ?Social Determinants of Health (SDOH) Interventions ?  ? ? ?Readmission Risk Interventions ?   ? View : No data to display.  ?  ?  ?  ? ? ? ? ? ?

## 2021-05-11 NOTE — Plan of Care (Signed)
°  Problem: Education: °Goal: Understanding of CV disease, CV risk reduction, and recovery process will improve °Outcome: Adequate for Discharge °Goal: Individualized Educational Video(s) °Outcome: Adequate for Discharge °  °

## 2021-05-11 NOTE — Progress Notes (Addendum)
Patient given discharge instructions and stated understanding. Patientas family wanted her to walk in the hall before leaving.  RN is walking patient and if OK daughter and husband will be taking patient home. ?

## 2021-05-13 NOTE — Telephone Encounter (Signed)
Returned call to Pt's husband. ? ?Advised to restart Xarelto 05/15/21 per dc summary. ?

## 2021-05-13 NOTE — Telephone Encounter (Signed)
Patient's husband following up, states he needs to know something before this weekend. Please advise  ?

## 2021-05-23 ENCOUNTER — Other Ambulatory Visit: Payer: Self-pay | Admitting: Cardiology

## 2021-05-24 DIAGNOSIS — H401123 Primary open-angle glaucoma, left eye, severe stage: Secondary | ICD-10-CM | POA: Diagnosis not present

## 2021-05-24 DIAGNOSIS — H47011 Ischemic optic neuropathy, right eye: Secondary | ICD-10-CM | POA: Diagnosis not present

## 2021-05-24 DIAGNOSIS — H47012 Ischemic optic neuropathy, left eye: Secondary | ICD-10-CM | POA: Diagnosis not present

## 2021-05-24 DIAGNOSIS — H4720 Unspecified optic atrophy: Secondary | ICD-10-CM | POA: Diagnosis not present

## 2021-05-25 ENCOUNTER — Ambulatory Visit (INDEPENDENT_AMBULATORY_CARE_PROVIDER_SITE_OTHER): Payer: Medicare Other

## 2021-05-25 DIAGNOSIS — I4892 Unspecified atrial flutter: Secondary | ICD-10-CM

## 2021-05-25 NOTE — Patient Instructions (Addendum)
? ?  After Your Pacemaker ? ? ?Monitor your pacemaker site for redness, swelling, and drainage. Call the device clinic at [phone] if you experience these symptoms or fever/chills. ? ?Your incision was closed with Steri-strips or staples:  You may shower and wash your incision with soap and water. Avoid lotions, ointments, or perfumes over your incision until it is well-healed. ? ?You may use a hot tub or a pool after your wound check appointment if the incision is completely closed. ? ?Do not lift, push or pull greater than 10 pounds with the affected arm until 6 weeks after your procedure. There are no other restrictions in arm movement after your wound check appointment. ? ?You may drive, unless driving has been restricted by your healthcare providers. ? ?Your Pacemaker is MRI compatible. ? ?Remote monitoring is used to monitor your pacemaker from home. This monitoring is scheduled every 91 days by our office. It allows [place] to keep an eye on the functioning of your device to ensure it is working properly. You will routinely see your Electrophysiologist annually (more often if necessary).  ?  ?

## 2021-05-26 LAB — CUP PACEART INCLINIC DEVICE CHECK
Battery Remaining Longevity: 130 mo
Battery Voltage: 3.11 V
Brady Statistic RA Percent Paced: 13 %
Brady Statistic RV Percent Paced: 99.6 %
Date Time Interrogation Session: 20230426092500
Implantable Lead Implant Date: 20230411
Implantable Lead Location: 753859
Implantable Pulse Generator Implant Date: 20230411
Lead Channel Impedance Value: 525 Ohm
Lead Channel Impedance Value: 537.5 Ohm
Lead Channel Pacing Threshold Amplitude: 0.625 V
Lead Channel Pacing Threshold Pulse Width: 0.5 ms
Lead Channel Sensing Intrinsic Amplitude: 4.4 mV
Lead Channel Setting Pacing Amplitude: 0.875
Lead Channel Setting Pacing Amplitude: 1.625
Lead Channel Setting Pacing Pulse Width: 0.5 ms
Lead Channel Setting Sensing Sensitivity: 2.5 mV
Pulse Gen Model: 2272
Pulse Gen Serial Number: 8069479

## 2021-05-26 NOTE — Progress Notes (Signed)
Wound check appointment s/p dual chamber PPM and AV nodal ablation. Steri-strips removed. Wound without redness or edema. Incision edges approximated, wound well healed. Normal device function. Thresholds, sensing, and impedances consistent with implant measurements. Device programmed at 3.5V/auto capture programmed on for extra safety margin until 3 month visit. Histogram distribution appropriate for patient and level of activity. No mode switches or high ventricular rates noted. Known history of AT/AF. Burden 80%. On [place]. Programmed DDDR 60. AMS rate 70. Patient tolerating well. Discussed with A. Tillery, PA. 5/10 appt canceled as rate dropped not needed.  Patient educated about wound care, arm mobility, lifting restrictions. Enrolled in remote monitoring. ROV in 3 months with implanting physician. ?

## 2021-06-06 DIAGNOSIS — N183 Chronic kidney disease, stage 3 unspecified: Secondary | ICD-10-CM | POA: Diagnosis not present

## 2021-06-09 ENCOUNTER — Other Ambulatory Visit: Payer: Self-pay | Admitting: Cardiology

## 2021-06-21 ENCOUNTER — Telehealth: Payer: Self-pay

## 2021-06-21 NOTE — Telephone Encounter (Signed)
The patient husband called to see if the monitor sent a transmission. I let him know that the monitor did update but did not send a full transmission. He states she woke up nauseated. I told him he could send a manual transmission for the nurse to review. He agreed.

## 2021-06-21 NOTE — Telephone Encounter (Signed)
Patient's husband is returning call. 

## 2021-06-21 NOTE — Telephone Encounter (Signed)
Returned call to Pt's husband.  Husband was concerned because he checked Pt's pulse with BP cuff and her heart rate showed 30 BPM.    Advised that because Pt is in atrial fibrillation, sometimes the BP cuff is unable to give a correct pulse rate.  Advised per remote check Pt's heart rate is typically 60-70 BPM.  Pt's RV output has reverted to high output.  Will recheck in one week.  Husband aware to send remote transmission 06/28/2021.  Will continue to follow.  Per husband, Pt is feeling well.

## 2021-06-21 NOTE — Telephone Encounter (Signed)
Outreach made to Pt.  Call went to VM.  Advised would call back in the AM to discuss symptoms.  Reviewed remote.

## 2021-06-21 NOTE — Telephone Encounter (Signed)
Transmission received. I let the patient know the nurse will review it and give them a call back.

## 2021-06-22 ENCOUNTER — Telehealth: Payer: Self-pay | Admitting: Internal Medicine

## 2021-06-22 NOTE — Telephone Encounter (Signed)
Pt c/o medication issue:  1. Name of Medication:    2. How are you currently taking this medication (dosage and times per day)?    3. Are you having a reaction (difficulty breathing--STAT)? no  4. What is your medication issue? Calling in to see if the patient is suppose to continue taking the medication that was issue prior to the pacemaker. Please advise  .

## 2021-06-22 NOTE — Telephone Encounter (Signed)
Returned the call to the patient's husband. He was calling to see if the patient could discontinue the [place], Jardiance and Xarelto due to the patient now having a pacemaker and due to cost. He has been advised to keep the patient on the medication and that we might be able to trying for assistance to help with the cost.   He then asked if the patient could at least stop the jardiance and would like Dr. Doug Sou opinion.

## 2021-06-23 ENCOUNTER — Ambulatory Visit (INDEPENDENT_AMBULATORY_CARE_PROVIDER_SITE_OTHER): Payer: Medicare Other | Admitting: Internal Medicine

## 2021-06-23 ENCOUNTER — Telehealth: Payer: Self-pay

## 2021-06-23 VITALS — BP 150/60 | HR 35

## 2021-06-23 DIAGNOSIS — I5032 Chronic diastolic (congestive) heart failure: Secondary | ICD-10-CM

## 2021-06-23 DIAGNOSIS — I421 Obstructive hypertrophic cardiomyopathy: Secondary | ICD-10-CM

## 2021-06-23 DIAGNOSIS — I517 Cardiomegaly: Secondary | ICD-10-CM | POA: Diagnosis not present

## 2021-06-23 DIAGNOSIS — I4892 Unspecified atrial flutter: Secondary | ICD-10-CM

## 2021-06-23 DIAGNOSIS — R001 Bradycardia, unspecified: Secondary | ICD-10-CM | POA: Diagnosis not present

## 2021-06-23 DIAGNOSIS — T82110A Breakdown (mechanical) of cardiac electrode, initial encounter: Secondary | ICD-10-CM

## 2021-06-23 DIAGNOSIS — Z01812 Encounter for preprocedural laboratory examination: Secondary | ICD-10-CM | POA: Diagnosis not present

## 2021-06-23 NOTE — Telephone Encounter (Signed)
Mr. Yellin the patient husband states she is feeling vibration on her right side. I had him help her send a manual transmission for the nurse to review. Transmission received 06/23/2021. The nurse reviewed the transmission and asked me to put the patient on the device clinic schedule for today.

## 2021-06-23 NOTE — Patient Instructions (Addendum)
Medication Instructions:  Your physician recommends that you continue on your current medications as directed. Please refer to the Current Medication list given to you today.  *If you need a refill on your cardiac medications before your next appointment, please call your pharmacy*   Lab Work: CBC and BMET today  If you have labs (blood work) drawn today and your tests are completely normal, you will receive your results only by: [place] (if you have MyChart) OR A paper copy in the mail If you have any lab test that is abnormal or we need to change your treatment, we will call you to review the results.   Testing/Procedures: None ordered.    Follow-Up: At [facility], you and your health needs are our priority.  As part of our continuing mission to provide you with exceptional heart care, we have created designated Provider Care Teams.  These Care Teams include your primary Cardiologist (physician) and Advanced Practice Providers (APPs -  Physician Assistants and Nurse Practitioners) who all work together to provide you with the care you need, when you need it.  We recommend signing up for the patient portal called "MyChart".  Sign up information is provided on this After Visit Summary.  MyChart is used to connect with patients for Virtual Visits (Telemedicine).  Patients are able to view lab/test results, encounter notes, upcoming appointments, etc.  Non-urgent messages can be sent to your provider as well.   To learn more about what you can do with MyChart, go to [email or web address].    Your next appointment:   To be scheduled  Important Information About [place]

## 2021-06-23 NOTE — Progress Notes (Signed)
Patient Care Team: Kelton Pillar, MD as PCP - General (Family Medicine) Martinique, Peter M, MD as PCP - Cardiology (Cardiology)   HPI  Melinda Gonzales is a 87 y.o. female seen as an add-on today as her husband had noted that her heart rate had gone from 2s following her AV ablation and pacemaker procedure in April 2023 to 30s over the last 4 or 5 days.  Not associated with lightheadedness or exercise intolerance but she has not active.  She has a history of hypertrophic cardiomyopathy with outflow gradient for which she underwent septal myectomy at the [facility] clinic.  She developed atypical atrial flutter with rapid rates and loss of left ventricular function prompting the above procedure.     Records and Results Reviewed   Past Medical History:  Diagnosis Date   Chronic diastolic CHF (congestive heart failure) ([place]) 07/06/2015   Dyspnea    Heart murmur    Hypercholesterolemia    Hypertrophic obstructive cardiomyopathy (HOCM) (HCC)    LVH (left ventricular hypertrophy)    WITH NORMAL SYSTOLIC FUNCTION   SOB (shortness of breath)     Past Surgical History:  Procedure Laterality Date   AV NODE ABLATION N/A 05/10/2021   Procedure: AV NODE ABLATION;  Surgeon: Evans Lance, MD;  Location: [place] CV LAB;  Service: Cardiovascular;  Laterality: N/A;   CARDIAC CATHETERIZATION N/A 07/06/2015   Procedure: Right/Left Heart Cath and Coronary Angiography;  Surgeon: Peter M Martinique, MD;  Location: [place] CV LAB;  Service: Cardiovascular;  Laterality: N/A;   CARDIOVERSION N/A 10/23/2018   Procedure: CARDIOVERSION;  Surgeon: Donato Heinz, MD;  Location: [facility] ENDOSCOPY;  Service: Endoscopy;  Laterality: N/A;   CARDIOVERSION N/A 03/29/2021   Procedure: CARDIOVERSION;  Surgeon: Sanda Rhyan Radler, MD;  Location: [place] ENDOSCOPY;  Service: Cardiovascular;  Laterality: N/A;   MITRAL VALVE REPLACEMENT N/A 11/15/2015   ligation of left atrial appendage, maze procedure at  [place] 05/10/2021   Procedure: PACEMAKER IMPLANT;  Surgeon: Evans Lance, MD;  Location: [place] CV LAB;  Service: Cardiovascular;  Laterality: N/A;    No outpatient medications have been marked as taking for the 06/23/21 encounter (Office Visit) with Deboraha Sprang, MD.    Allergies  Allergen Reactions   Oxycodone Nausea And Vomiting    And the sweats   Statins     Myalgias tolerates crestor      Review of Systems negative except from HPI and PMH  Physical Exam BP (!) 150/60 (BP Location: Right Arm, Patient Position: Sitting, Cuff Size: Normal)   Pulse (!) 35   SpO2 96%  Well developed and well nourished in no acute distress HENT normal E scleral and icterus clear Neck Supple JVP flat; carotids brisk and full Clear to ausculation Device pocket well healed; without hematoma or erythema.  There is no tethering  Slow Regular rate and rhythm, no murmurs gallops or rub Soft with active bowel sounds No clubbing cyanosis  Edema Alert and oriented, grossly normal motor and sensory function Skin Warm and Dry  ECG atrial flutter with 7: 1 conduction  CrCl cannot be calculated (Patient's most recent lab result is older than the maximum 21 days allowed.).   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    Chest x-ray was reviewed seem  to have modest tension on both leads.  The other possibility is that there was perforation into the left ventricle as she had a QR in lead V1 suggestive of deeply embedded left bundle branch area lead.  Discussed with Dr. Lovena Le, she will need revision of both of her leads.  She is largely asymptomatic and was his recommendation to allow her to go home and to come in in the morning.  We will check labs tonight.  We will have her hold her  Xarelto and her carvedilol tonight.  The benefits and risks were reviewed including but not limited to death,  perforation, infection, lead dislodgement and device malfunction.  The patient understands agrees and is willing to proceed.       Current medicines are reviewed at length with the patient today .  The patient does not  have concerns regarding medicines.

## 2021-06-23 NOTE — Telephone Encounter (Signed)
Would ideally like to continue all the medications. Having the pacemaker doesn't change this. If unable to afford would stop the Jardiance first. Let's investigate if we can get patient assistance for meds  Shamon Lobo Martinique MD, [facility]

## 2021-06-24 ENCOUNTER — Ambulatory Visit (HOSPITAL_COMMUNITY)
Admission: RE | Disposition: A | Discharge status: Home / Self Care | Payer: Self-pay | Source: Home / Self Care | Attending: Internal Medicine

## 2021-06-24 ENCOUNTER — Ambulatory Visit (HOSPITAL_COMMUNITY)
Admission: RE | Admit: 2021-06-24 | Discharge: 2021-06-25 | Disposition: A | Discharge status: Home / Self Care | Payer: Medicare Other | Attending: Internal Medicine | Admitting: Internal Medicine

## 2021-06-24 DIAGNOSIS — Z950 Presence of cardiac pacemaker: Secondary | ICD-10-CM | POA: Diagnosis not present

## 2021-06-24 DIAGNOSIS — T82120A Displacement of cardiac electrode, initial encounter: Secondary | ICD-10-CM

## 2021-06-24 DIAGNOSIS — I421 Obstructive hypertrophic cardiomyopathy: Secondary | ICD-10-CM | POA: Diagnosis present

## 2021-06-24 DIAGNOSIS — Y848 Other medical procedures as the cause of abnormal reaction of the patient, or of later complication, without mention of misadventure at the time of the procedure: Secondary | ICD-10-CM | POA: Diagnosis not present

## 2021-06-24 DIAGNOSIS — T82110A Breakdown (mechanical) of cardiac electrode, initial encounter: Secondary | ICD-10-CM | POA: Diagnosis present

## 2021-06-24 DIAGNOSIS — I4892 Unspecified atrial flutter: Secondary | ICD-10-CM | POA: Diagnosis not present

## 2021-06-24 DIAGNOSIS — R001 Bradycardia, unspecified: Secondary | ICD-10-CM

## 2021-06-24 DIAGNOSIS — I442 Atrioventricular block, complete: Secondary | ICD-10-CM | POA: Diagnosis not present

## 2021-06-24 HISTORY — PX: LEAD REVISION/REPAIR: EP1213

## 2021-06-24 LAB — CBC
Hematocrit: 42.1 % (ref 34.0–46.6)
Hemoglobin: 13.8 g/dL (ref 11.1–15.9)
MCH: 31.2 pg (ref 26.6–33.0)
MCHC: 32.8 g/dL (ref 31.5–35.7)
MCV: 95 fL (ref 79–97)
Platelets: 236 10*3/uL (ref 150–450)
RBC: 4.43 x10E6/uL (ref 3.77–5.28)
RDW: 12.4 % (ref 11.7–15.4)
WBC: 8.8 10*3/uL (ref 3.4–10.8)

## 2021-06-24 LAB — BASIC METABOLIC PANEL
BUN/Creatinine Ratio: 19 (ref 12–28)
BUN: 29 mg/dL — ABNORMAL HIGH (ref 8–27)
CO2: 16 mmol/L — ABNORMAL LOW (ref 20–29)
Calcium: 9.1 mg/dL (ref 8.7–10.3)
Chloride: 108 mmol/L — ABNORMAL HIGH (ref 96–106)
Creatinine, Ser: 1.52 mg/dL — ABNORMAL HIGH (ref 0.57–1.00)
Glucose: 112 mg/dL — ABNORMAL HIGH (ref 70–99)
Potassium: 4.7 mmol/L (ref 3.5–5.2)
Sodium: 144 mmol/L (ref 134–144)
eGFR: 33 mL/min/{1.73_m2} — ABNORMAL LOW (ref >59)

## 2021-06-24 SURGERY — LEAD REVISION/REPAIR

## 2021-06-24 MED ORDER — ONDANSETRON HCL 4 MG/2ML IJ SOLN: 4.0000 mg | Freq: Four times a day (QID) | INTRAMUSCULAR | Status: DC | PRN

## 2021-06-24 MED ORDER — LIDOCAINE HCL 1 % IJ SOLN
INTRAMUSCULAR | Status: AC
  Filled 2021-06-24: qty 20

## 2021-06-24 MED ORDER — MIDAZOLAM HCL 5 MG/5ML IJ SOLN
INTRAMUSCULAR | Status: DC | PRN
  Administered 2021-06-24 (×2): 1 mg via INTRAVENOUS

## 2021-06-24 MED ORDER — ACETAMINOPHEN 325 MG PO TABS
325.0000 mg | ORAL_TABLET | ORAL | Status: DC | PRN
  Administered 2021-06-25: 650 mg via ORAL
  Filled 2021-06-24: qty 2

## 2021-06-24 MED ORDER — FENTANYL CITRATE (PF) 100 MCG/2ML IJ SOLN
INTRAMUSCULAR | Status: DC | PRN
  Administered 2021-06-24 (×2): 12.5 ug via INTRAVENOUS

## 2021-06-24 MED ORDER — CEFAZOLIN SODIUM-DEXTROSE 1-4 GM/50ML-% IV SOLN
1.0000 g | Freq: Four times a day (QID) | INTRAVENOUS | Status: AC
  Administered 2021-06-24 – 2021-06-25 (×3): 1 g via INTRAVENOUS
  Filled 2021-06-24 (×4): qty 50

## 2021-06-24 MED ORDER — FENTANYL CITRATE (PF) 100 MCG/2ML IJ SOLN
INTRAMUSCULAR | Status: AC
  Filled 2021-06-24: qty 2

## 2021-06-24 MED ORDER — CEFAZOLIN SODIUM-DEXTROSE 2-4 GM/100ML-% IV SOLN
INTRAVENOUS | Status: AC
  Filled 2021-06-24: qty 100

## 2021-06-24 MED ORDER — MIDAZOLAM HCL 5 MG/5ML IJ SOLN
INTRAMUSCULAR | Status: AC
  Filled 2021-06-24: qty 5

## 2021-06-24 MED ORDER — LIDOCAINE HCL (PF) 1 % IJ SOLN
INTRAMUSCULAR | Status: AC
  Filled 2021-06-24: qty 30

## 2021-06-24 MED ORDER — SODIUM CHLORIDE 0.9 % IV SOLN
80.0000 mg | INTRAVENOUS | Status: AC
  Administered 2021-06-24: 80 mg

## 2021-06-24 MED ORDER — CHLORHEXIDINE GLUCONATE 4 % EX LIQD
4.0000 "application " | Freq: Once | CUTANEOUS | Status: DC
  Filled 2021-06-24: qty 60

## 2021-06-24 MED ORDER — SODIUM CHLORIDE 0.9 % IV SOLN: INTRAVENOUS | Status: DC

## 2021-06-24 MED ORDER — GENTAMICIN SULFATE 40 MG/ML IJ SOLN
INTRAMUSCULAR | Status: AC
  Filled 2021-06-24: qty 2

## 2021-06-24 MED ORDER — CEFAZOLIN SODIUM-DEXTROSE 2-4 GM/100ML-% IV SOLN
2.0000 g | INTRAVENOUS | Status: AC
  Administered 2021-06-24: 2 g via INTRAVENOUS

## 2021-06-24 MED ORDER — HEPARIN (PORCINE) IN NACL 1000-0.9 UT/500ML-% IV SOLN
INTRAVENOUS | Status: DC | PRN
  Administered 2021-06-24: 500 mL

## 2021-06-24 MED ORDER — LIDOCAINE HCL (PF) 1 % IJ SOLN
INTRAMUSCULAR | Status: DC | PRN
  Administered 2021-06-24: 60 mL

## 2021-06-24 MED ORDER — HEPARIN (PORCINE) IN NACL 1000-0.9 UT/500ML-% IV SOLN
INTRAVENOUS | Status: AC
  Filled 2021-06-24: qty 500

## 2021-06-24 SURGICAL SUPPLY — 14 items
CABLE SURGICAL S-101-97-12 (CABLE) ×2 IMPLANT
CATH CPS LOCATOR 3D LG (CATHETERS) ×1 IMPLANT
CPS IMPLANT KIT 410190 (MISCELLANEOUS) ×1 IMPLANT
HELIX LOCKING TOOL (MISCELLANEOUS) ×2
KIT MICROPUNCTURE NIT STIFF (SHEATH) ×1 IMPLANT
KIT WRENCH PACEMAKER ASSEM (MISCELLANEOUS) ×1 IMPLANT
LEAD TENDRIL MRI 52CM LPA1200M (Lead) ×1 IMPLANT
LEAD TENDRIL SDX 2088TC-58CM (Lead) ×1 IMPLANT
PAD DEFIB RADIO PHYSIO CONN (PAD) ×2 IMPLANT
SHEATH 8FR PRELUDE SNAP 13 (SHEATH) ×1 IMPLANT
SHEATH 9FR PRELUDE SNAP 13 (SHEATH) ×1 IMPLANT
SLITTER AGILIS HISPRO (INSTRUMENTS) ×1 IMPLANT
TOOL HELIX LOCKING (MISCELLANEOUS) IMPLANT
TRAY PACEMAKER INSERTION (PACKS) ×2 IMPLANT

## 2021-06-24 NOTE — Discharge Instructions (Signed)
    Supplemental Discharge Instructions for  Pacemaker/Defibrillator Patients   Activity No heavy lifting or vigorous activity with your left/right arm for 6 to 8 weeks.  Do not raise your left/right arm above your head for one week.  Gradually raise your affected arm as drawn below.              06/28/21                   06/29/21                     06/30/21                    07/01/21 __  NO DRIVING until cleared to at your wound check visit  .  WOUND CARE Keep the wound area clean and dry.  Do not get this area wet , no showers until cleared to at your wound check visit . The tape/steri-strips on your wound will fall off; do not pull them off.  No bandage is needed on the site.  DO  NOT apply any creams, oils, or ointments to the wound area. If you notice any drainage or discharge from the wound, any swelling or bruising at the site, or you develop a fever > 101? F after you are discharged home, call the office at once.  Special Instructions You are still able to use cellular telephones; use the ear opposite the side where you have your pacemaker/defibrillator.  Avoid carrying your cellular phone near your device. When traveling through airports, show security personnel your identification card to avoid being screened in the metal detectors.  Ask the security personnel to use the hand wand. Avoid arc welding equipment, MRI testing (magnetic resonance imaging), TENS units (transcutaneous nerve stimulators).  Call the office for questions about other devices. Avoid electrical appliances that are in poor condition or are not properly grounded. Microwave ovens are safe to be near or to operate.  DO NOT BEGIN Kieth Brightly 06/30/2021

## 2021-06-24 NOTE — TOC Initial Note (Signed)
Transition of Care [facility]) - Initial/Assessment Note    Patient Details  Name: MAKITA BLOW MRN: [id number] Date of Birth: [birth year]  Transition of [facility]) CM/SW Contact:    Verdell Carmine, RN Phone Number: 06/24/2021, 2:50 PM  Clinical Narrative:                    The Transition of Care Department [facility]) has reviewed patient and no TOC needs have been identified at this time. We will continue to monitor patient advancement through interdisciplinary progression rounds. If new patient transition needs arise, please place a TOC consult      Patient Goals and CMS Choice        Expected Discharge Plan and Services                                                Prior Living Arrangements/Services                       Activities of Daily Living      Permission Sought/Granted                  Emotional Assessment              Admission diagnosis:  Complete heart block ([place]) [I44.2] Patient Active Problem List   Diagnosis Date Noted   Complete heart block ([place]) 06/24/2021   Atrial flutter ([place])    Atrial fibrillation ([place]) 12/21/2015   Mitral insufficiency 11/19/2015   S/P mitral valve replacement 11/19/2015   Chronic diastolic CHF (congestive heart failure) ([place]) 07/06/2015   NSVT (nonsustained ventricular tachycardia) ([place]) 04/24/2014   Hypertrophic obstructive cardiomyopathy (HOCM) (HCC)    Hypercholesterolemia    Dyspnea    Heart murmur    LVH (left ventricular hypertrophy)    SOB (shortness of breath)    PCP:  Kelton Pillar, MD Pharmacy:   [facility], [address] [place] [address] [address] Phone: [phone] Fax: [phone]     Social Determinants of Health (SDOH) Interventions    Readmission Risk Interventions     View : No data to display.

## 2021-06-25 ENCOUNTER — Ambulatory Visit (HOSPITAL_COMMUNITY): Payer: Medicare Other

## 2021-06-25 ENCOUNTER — Encounter (HOSPITAL_COMMUNITY): Payer: Self-pay | Admitting: Internal Medicine

## 2021-06-25 DIAGNOSIS — I442 Atrioventricular block, complete: Secondary | ICD-10-CM | POA: Diagnosis not present

## 2021-06-25 DIAGNOSIS — I421 Obstructive hypertrophic cardiomyopathy: Secondary | ICD-10-CM | POA: Diagnosis not present

## 2021-06-25 DIAGNOSIS — I4892 Unspecified atrial flutter: Secondary | ICD-10-CM | POA: Diagnosis not present

## 2021-06-25 DIAGNOSIS — T82110A Breakdown (mechanical) of cardiac electrode, initial encounter: Secondary | ICD-10-CM | POA: Diagnosis not present

## 2021-06-25 DIAGNOSIS — Z950 Presence of cardiac pacemaker: Secondary | ICD-10-CM | POA: Diagnosis not present

## 2021-06-25 HISTORY — DX: Breakdown (mechanical) of cardiac electrode, initial encounter: T82.110A

## 2021-06-25 MED ORDER — ACETAMINOPHEN 325 MG PO TABS
325.0000 mg | ORAL_TABLET | ORAL | Status: AC | PRN
Start: 2021-06-25 — End: ?

## 2021-06-25 MED ORDER — RIVAROXABAN 15 MG PO TABS: 15.0000 mg | ORAL_TABLET | Freq: Every day | ORAL | 5 refills | Status: DC

## 2021-06-25 NOTE — Discharge Summary (Cosign Needed)
Discharge Summary    Patient ID: Melinda Gonzales MRN: [id number]; DOB: [birth year]  Admit date: 06/24/2021 Discharge date: 06/25/2021  PCP:  Kelton Pillar, MD   [facility] Providers Cardiologist:  Peter Martinique, MD        Discharge Diagnoses    Principal Problem:   Complete heart block [facility]) Active Problems:   Hypertrophic obstructive cardiomyopathy (HOCM) ([place])   Atrial flutter [facility])   Pacemaker lead failure    Diagnostic Studies/Procedures   Pacer wire revision.  Conclusion: Successful atrial and ventricular lead revision with new atrial and ventricular leads placed in the left bundle area and anterolateral portion of the right atrium.  The patient's leads had dislodged back out of the heart secondary to the generator being wrapped approximately 20 times around itself. _____________   History of Present Illness     Melinda Gonzales is a 87 y.o. female with recent AV node ablation and [employer] PPM placed 05/10/21, permanent atrial fib on Xarelto, HOCM with septal myectomy 2017, VHD hx MVR 2017, MAZE and ligation, LBBB, chronic CHF and pulmonary HTN was seen in office by Dr. Caryl Comes urgently for HR in the 4s.  Found to be in high grade heart block with 7:1 conduction and brady following AV junction ablation.  Pacemaker lead failure likely dislodgment of both atrial and ventricular leads.  Chest x-ray was reviewed seem to have modest tension on both leads.  The other possibility is that there was perforation into the left ventricle as she had a QR in lead V1 suggestive of deeply embedded left bundle branch area lead.    Dr. Caryl Comes and Dr. Lovena Le discussed and plans for elective but needed procedure the next AM pt was stable, no hypotension.   Pt presented 5/26.23 for lead revision Conclusion: Successful atrial and ventricular lead revision with new atrial and ventricular leads placed in the left bundle area and anterolateral portion of the right atrium.  The patient's leads had  dislodged back out of the heart secondary to the generator being wrapped approximately 20 times around itself.      Hospital Course     Consultants: none   Pt did well overnight after pacer lead revision.  She has been seen and evaluated by Dr. Lovena Le today and found stable for discharge. Her appointments have been made. Her CXR with no pneumothorax.    Her device has been interrogated and reviewed by Dr. Lovena Le.   DO NOT BEGIN XARELTO UNTIL 06/30/2021   Did the patient have an acute coronary syndrome (MI, NSTEMI, STEMI, etc) this admission?:  No                               Did the patient have a percutaneous coronary intervention (stent / angioplasty)?:  No.        The patient will be scheduled for a TOC follow up appointment in appt has been made.  A message has been sent to the [facility] and Scheduling Pool at the office where the patient should be seen for follow up.  _____________  Discharge Vitals Blood pressure (!) 144/52, pulse 70, temperature 98.4 F (36.9 C), temperature source Oral, resp. rate 20, height '5\' 2"'$  (1.575 m), weight 82.3 kg, SpO2 95 %.  Filed Weights   06/24/21 1119 06/24/21 1439 06/25/21 0400  Weight: 67.6 kg 84.3 kg 82.3 kg    Labs & Radiologic Studies    CBC Recent Labs  06/23/21 1649  WBC 8.8  HGB 13.8  HCT 42.1  MCV 95  PLT 202   Basic Metabolic Panel Recent Labs    06/23/21 1649  NA 144  K 4.7  CL 108*  CO2 16*  GLUCOSE 112*  BUN 29*  CREATININE 1.52*  CALCIUM 9.1   Liver Function Tests No results for input(s): AST, ALT, ALKPHOS, BILITOT, PROT, ALBUMIN in the last 72 hours. No results for input(s): LIPASE, AMYLASE in the last 72 hours. High Sensitivity Troponin:   No results for input(s): TROPONINIHS in the last 720 hours.  BNP Invalid input(s): POCBNP D-Dimer No results for input(s): DDIMER in the last 72 hours. Hemoglobin A1C No results for input(s): HGBA1C in the last 72 hours. Fasting Lipid Panel No results for  input(s): CHOL, HDL, LDLCALC, TRIG, CHOLHDL, LDLDIRECT in the last 72 hours. Thyroid Function Tests No results for input(s): TSH, T4TOTAL, T3FREE, THYROIDAB in the last 72 hours.  Invalid input(s): FREET3 _____________  DG Chest 2 View  Result Date: 06/25/2021 CLINICAL DATA:  Status post pacer lead revision. EXAM: CHEST - 2 VIEW COMPARISON:  05/11/2021 FINDINGS: Left chest wall pacer device is identified with leads projecting over the right atrium and right ventricle. Pacer leads appear intact. On the lateral chest radiograph the tip of both leads are oriented anteriorly. No pneumothorax identified. Previous median sternotomy. Left atrial clip noted. Heart size appears normal. No pleural effusion or edema. No airspace opacities. IMPRESSION: No complicating features identified status post lead revision of left chest wall pacer. The leads project over the right atrium and right ventricle and are oriented anteriorly. Electronically Signed   By: Kerby Moors M.D.   On: 06/25/2021 10:01   EP PPM/ICD IMPLANT  Result Date: 06/24/2021 Conclusion: Successful atrial and ventricular lead revision with new atrial and ventricular leads placed in the left bundle area and anterolateral portion of the right atrium.  The patient's leads had dislodged back out of the heart secondary to the generator being wrapped approximately 20 times around itself. Cristopher Peru, MD    Disposition   Pt is being discharged home today in good condition.  Follow-up Plans & Appointments     Follow-up Information     [place] [facility] Follow up on 07/07/2021.   Specialty: Cardiology Why: at 11:20 Am for wound check in pacer clinic Contact information: [address] [place]        Baldwin Jamaica, PA-C Follow up on 07/25/2021.   Specialty: Cardiology Why: at 1:55 pm please arrive 15 min prior to visit to check in. Contact information: [address] [place] [address] [phone]                pacer Instructions have been given and  To begin xarelto 06/30/21  Discharge Medications   Allergies as of 06/25/2021       Reactions   Oxycodone Nausea And Vomiting   And the sweats   Statins    Myalgias tolerates crestor        Medication List     TAKE these medications    acetaminophen 325 MG tablet Commonly known as: TYLENOL Take 1-2 tablets (325-650 mg total) by mouth every 4 (four) hours as needed for mild pain.   carvedilol 6.25 MG tablet Commonly known as: COREG TAKE 1 TABLET BY MOUTH TWICE A DAY   empagliflozin 10 MG Tabs tablet Commonly known as: Jardiance Take 1  tablet (10 mg total) by mouth daily before breakfast.   Entresto 24-26 MG Generic drug: sacubitril-valsartan TAKE 1 TABLET BY MOUTH TWICE A DAY   furosemide 40 MG tablet Commonly known as: LASIX TAKE 1 TABLET BY MOUTH DAILY   latanoprost 0.005 % ophthalmic solution Commonly known as: XALATAN Place 1 drop into both eyes at bedtime.   potassium chloride SA 20 MEQ tablet Commonly known as: KLOR-CON M TAKE 1 TABLET BY MOUTH DAILY   PreserVision AREDS 2 Caps Take 1 capsule by mouth 2 (two) times daily.   Prevagen 10 MG Caps Generic drug: Apoaequorin Take 10 mg by mouth daily.   Rivaroxaban 15 MG Tabs tablet Commonly known as: Xarelto Take 1 tablet (15 mg total) by mouth daily with supper. Start taking on: June 30, 2021 What changed:  how much to take These instructions start on June 30, 2021. If you are unsure what to do until then, ask your doctor or other care provider.   rosuvastatin 10 MG tablet Commonly known as: CRESTOR TAKE 1 TABLET BY MOUTH DAILY   Salonpas Pain Relief Patch Ptch Apply 1 packet topically daily as needed (pain).           Outstanding Labs/Studies     Duration of Discharge Encounter   Greater than 30 minutes including physician time.  Signed, Cecilie Kicks, NP 06/25/2021, 10:04  AM  EP Attending  Patient seen and examined. See my progress note as well. The patient has done well after PM lead insertion and removal of the dislodged, twiddle PM leads. I discussed with the patient and her husband. He notes that his wife often "plays with her skin" around the pacemaker. She is encouraged not to. She will be discharged home with usual followup.   Carleene Overlie Taylor,MD

## 2021-06-26 NOTE — H&P (Signed)
HPI   Melinda Gonzales is a 87 y.o. female seen as an add-on today as her husband had noted that her heart rate had gone from 80s following her AV ablation and pacemaker procedure in April 2023 to 30s over the last 4 or 5 days.  Not associated with lightheadedness or exercise intolerance but she has not active.  She has a history of hypertrophic cardiomyopathy with outflow gradient for which she underwent septal myectomy at the [facility] clinic.  She developed atypical atrial flutter with rapid rates and loss of left ventricular function prompting the above procedure.        Records and Results Reviewed        Past Medical History:  Diagnosis Date   Chronic diastolic CHF (congestive heart failure) ([place]) 07/06/2015   Dyspnea     Heart murmur     Hypercholesterolemia     Hypertrophic obstructive cardiomyopathy (HOCM) (HCC)     LVH (left ventricular hypertrophy)      WITH NORMAL SYSTOLIC FUNCTION   SOB (shortness of breath)             Past Surgical History:  Procedure Laterality Date   AV NODE ABLATION N/A 05/10/2021    Procedure: AV NODE ABLATION;  Surgeon: Evans Lance, MD;  Location: [place] CV LAB;  Service: Cardiovascular;  Laterality: N/A;   CARDIAC CATHETERIZATION N/A 07/06/2015    Procedure: Right/Left Heart Cath and Coronary Angiography;  Surgeon: Peter M Martinique, MD;  Location: [place] CV LAB;  Service: Cardiovascular;  Laterality: N/A;   CARDIOVERSION N/A 10/23/2018    Procedure: CARDIOVERSION;  Surgeon: Donato Heinz, MD;  Location: [facility] ENDOSCOPY;  Service: Endoscopy;  Laterality: N/A;   CARDIOVERSION N/A 03/29/2021    Procedure: CARDIOVERSION;  Surgeon: Sanda Klein, MD;  Location: [place] ENDOSCOPY;  Service: Cardiovascular;  Laterality: N/A;   MITRAL VALVE REPLACEMENT N/A 11/15/2015    ligation of left atrial appendage, maze procedure at [place] 05/10/2021    Procedure: PACEMAKER IMPLANT;  Surgeon: Evans Lance, MD;   Location: [place] CV LAB;  Service: Cardiovascular;  Laterality: N/A;      Active Medications  No outpatient medications have been marked as taking for the 06/23/21 encounter (Office Visit) with Deboraha Sprang, MD.             Allergies  Allergen Reactions   Oxycodone Nausea And Vomiting      And the sweats   Statins        Myalgias tolerates crestor          Review of Systems negative except from HPI and PMH   Physical Exam BP (!) 150/60 (BP Location: Right Arm, Patient Position: Sitting, Cuff Size: Normal)   Pulse (!) 35   SpO2 96%  Well developed and well nourished in no acute distress HENT normal E scleral and icterus clear Neck Supple JVP flat; carotids brisk and full Clear to ausculation Device pocket well healed; without hematoma or erythema.  There is no tethering  Slow Regular rate and rhythm, no murmurs gallops or rub Soft with active bowel sounds No clubbing cyanosis  Edema Alert and oriented, grossly normal motor and sensory function Skin Warm and Dry   ECG atrial flutter with 7: 1 conduction   CrCl cannot be calculated (Patient's most recent lab result is older than the maximum 21 days allowed.).     Assessment and  Plan   Atrial flutter   High-grade  heart block with 7: 1 conduction and bradycardia following AV junction ablation   Pacemaker lead failure likely dislodgment of both atrial and ventricular leads.     Chest x-ray was reviewed seem to have modest tension on both leads.  The other possibility is that there was perforation into the left ventricle as she had a QR in lead V1 suggestive of deeply embedded left bundle branch area lead.   Discussed with Dr. Lovena Le, she will need revision of both of her leads.  She is largely asymptomatic and was his recommendation to allow her to go home and to come in in the morning.   We will check labs tonight.  We will have her hold her Xarelto and her carvedilol tonight.   The benefits and risks were  reviewed including but not limited to death,  perforation, infection, lead dislodgement and device malfunction.  The patient understands agrees and is willing to proceed.  EP Attending  Patient sen and examined. Agree with above. The patient presents with bradycardia due to ventricular lead dislodgement. She is willing to proceed with lead revision.  Carleene Overlie Sadye Kiernan,MD

## 2021-06-28 ENCOUNTER — Encounter (HOSPITAL_COMMUNITY): Payer: Self-pay | Admitting: Internal Medicine

## 2021-06-28 MED FILL — Lidocaine HCl Local Preservative Free (PF) Inj 1%: INTRAMUSCULAR | Qty: 30 | Status: AC

## 2021-06-29 NOTE — Telephone Encounter (Signed)
Called patient and made her aware of Dr. Doug Sou recommendations.   Patient assistance applications for Alcide Clever, and [employer] and mailed to patient.   Confirmed with patient that she does have enough of all of her medications at this time. Advised patient to let us know if samples are needed.    Patient aware of all instructions and will bring applications once finished.

## 2021-07-07 ENCOUNTER — Ambulatory Visit (INDEPENDENT_AMBULATORY_CARE_PROVIDER_SITE_OTHER): Payer: Medicare Other

## 2021-07-07 ENCOUNTER — Other Ambulatory Visit: Payer: Self-pay | Admitting: Internal Medicine

## 2021-07-07 DIAGNOSIS — I442 Atrioventricular block, complete: Secondary | ICD-10-CM

## 2021-07-07 LAB — CUP PACEART INCLINIC DEVICE CHECK
Battery Remaining Longevity: 68 mo
Battery Voltage: 3.04 V
Brady Statistic RA Percent Paced: 88 %
Brady Statistic RV Percent Paced: 99.36 %
Date Time Interrogation Session: 20230608123324
Implantable Lead Implant Date: 20230411
Implantable Lead Implant Date: 20230411
Implantable Lead Location: 753859
Implantable Lead Location: 753860
Implantable Pulse Generator Implant Date: 20230411
Lead Channel Impedance Value: 437.5 Ohm
Lead Channel Impedance Value: 512.5 Ohm
Lead Channel Pacing Threshold Amplitude: 0.75 V
Lead Channel Pacing Threshold Amplitude: 0.75 V
Lead Channel Pacing Threshold Amplitude: 2.75 V
Lead Channel Pacing Threshold Pulse Width: 0.5 ms
Lead Channel Pacing Threshold Pulse Width: 0.5 ms
Lead Channel Pacing Threshold Pulse Width: 0.5 ms
Lead Channel Sensing Intrinsic Amplitude: 0.6 mV
Lead Channel Sensing Intrinsic Amplitude: 8.5 mV
Lead Channel Setting Pacing Amplitude: 0.875
Lead Channel Setting Pacing Amplitude: 3.5 V
Lead Channel Setting Pacing Pulse Width: 0.5 ms
Lead Channel Setting Sensing Sensitivity: 4 mV
Pulse Gen Model: 2272
Pulse Gen Serial Number: 8069479

## 2021-07-07 NOTE — Progress Notes (Signed)
Wound check appointment. Steri-strips removed. Wound without redness or edema. Incision edges approximated, wound well healed. Normal device function. Thresholds, sensing, and impedances consistent with implant measurements. Device programmed at 3.5V/auto capture programmed on for extra safety margin until 3 month visit. Histogram distribution appropriate for patient and level of activity. 1127 AMS 15% AT/AF actual burden likely higher as undersensing of AF noted on presenting. Atrial sensitivity programmed from 0.10m to 0.370m Patient educated about wound care, arm mobility, lifting restrictions. ROV 07/25/21 with R. UrCharlcie Cradle

## 2021-07-07 NOTE — Patient Instructions (Addendum)
   After Your Pacemaker   Monitor your pacemaker site for redness, swelling, and drainage. Call the device clinic at [phone] if you experience these symptoms or fever/chills.  Your incision was closed with Steri-strips or staples:  You may shower 7 days after your procedure and wash your incision with soap and water. Avoid lotions, ointments, or perfumes over your incision until it is well-healed.  You may use a hot tub or a pool after your wound check appointment if the incision is completely closed.  Do not lift, push or pull greater than 10 pounds with the affected arm until August 05 2021. There are no other restrictions in arm movement after your wound check appointment.  You may drive, unless driving has been restricted by your healthcare providers.   Remote monitoring is used to monitor your pacemaker from home. This monitoring is scheduled every 91 days by our office. It allows [place] to keep an eye on the functioning of your device to ensure it is working properly. You will routinely see your Electrophysiologist annually (more often if necessary).

## 2021-07-24 NOTE — Progress Notes (Deleted)
Cardiology Office Note Date:  07/24/2021  Patient ID:  Melinda Gonzales, Melinda Gonzales [birth year], MRN [id number] PCP:  Kelton Pillar, MD  Cardiologist:  Dr. Martinique Electrophysiologist: ***  ***refresh   Chief Complaint: ***  History of Present Illness: Melinda Gonzales is a 87 y.o. 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 Lovena Le for AFlutter management options, not felt to have AAD given past issue with amio, recommended AVNode ablation and PPM implant.  Underwent procedures 05/10/21 Both leads dislodged (suspect twiddlers finding the leads wrapped around the can multiple times. Both leads revised/replaced 06/24/21  *** wound *** acute implant outputs *** restrictions *** twiddlers *** xarelto, bleeding, dose *** burden *** volume *** update echo w/GT or after...   Device information Abbott dual chamber PPM implanted 05/10/21  RV lead in Left bundle/septal position  AFib/AAD hx Diagnosed 2017 Amiodarone started 2017 stopped with visual concerns, ?optic nerve problem AFlutter 2020, atypical AV node ablation 05/10/21  Past Medical History:  Diagnosis Date   Chronic diastolic CHF (congestive heart failure) (HCC) 07/06/2015   Dyspnea    Heart murmur    Hypercholesterolemia    Hypertrophic obstructive cardiomyopathy (HOCM) (HCC)    LVH (left ventricular hypertrophy)    WITH NORMAL SYSTOLIC FUNCTION   Pacemaker lead failure 06/25/2021   SOB (shortness of breath)     Past Surgical History:  Procedure Laterality Date   AV NODE ABLATION N/A 05/10/2021   Procedure: AV NODE ABLATION;  Surgeon: Evans Lance, MD;  Location: [place] [facility];  Service: Cardiovascular;  Laterality: N/A;   CARDIAC CATHETERIZATION N/A 07/06/2015   Procedure: Right/Left Heart Cath and Coronary Angiography;  Surgeon: Peter M Martinique, MD;  Location: [place] [facility];   Service: Cardiovascular;  Laterality: N/A;   CARDIOVERSION N/A 10/23/2018   Procedure: CARDIOVERSION;  Surgeon: Donato Heinz, MD;  Location: [facility] ENDOSCOPY;  Service: Endoscopy;  Laterality: N/A;   CARDIOVERSION N/A 03/29/2021   Procedure: CARDIOVERSION;  Surgeon: Sanda Klein, MD;  Location: [facility] ENDOSCOPY;  Service: Cardiovascular;  Laterality: N/A;   LEAD REVISION/REPAIR N/A 06/24/2021   Procedure: LEAD REVISION/REPAIR;  Surgeon: Evans Lance, MD;  Location: [place] [facility];  Service: Cardiovascular;  Laterality: N/A;   MITRAL VALVE REPLACEMENT N/A 11/15/2015   ligation of left atrial appendage, maze procedure at [place] 05/10/2021   Procedure: PACEMAKER IMPLANT;  Surgeon: Evans Lance, MD;  Location: [place] [facility];  Service: Cardiovascular;  Laterality: N/A;    Current Outpatient Medications  Medication Sig Dispense Refill   acetaminophen (TYLENOL) 325 MG tablet Take 1-2 tablets (325-650 mg total) by mouth every 4 (four) hours as needed for mild pain.     Apoaequorin (PREVAGEN) 10 MG CAPS Take 10 mg by mouth daily.     carvedilol (COREG) 6.25 MG tablet TAKE 1 TABLET BY MOUTH TWICE A DAY 60 tablet 6   empagliflozin (JARDIANCE) 10 MG TABS tablet Take 1 tablet (10 mg total) by mouth daily before breakfast. 90 tablet 3   ENTRESTO 24-26 MG TAKE 1 TABLET BY MOUTH TWICE A DAY 60 tablet 10   furosemide (LASIX) 40 MG tablet TAKE 1 TABLET BY MOUTH DAILY 30 tablet 8   latanoprost (XALATAN) 0.005 % ophthalmic solution Place 1 drop into both eyes at bedtime.     Menthol-Methyl Salicylate (SALONPAS PAIN RELIEF PATCH)  PTCH Apply 1 packet topically daily as needed (pain).     Multiple Vitamins-Minerals (PRESERVISION AREDS 2) CAPS Take 1 capsule by mouth 2 (two) times daily.     potassium chloride SA (KLOR-CON M) 20 MEQ tablet TAKE 1 TABLET BY MOUTH DAILY 30 tablet 6   Rivaroxaban (XARELTO) 15 MG TABS tablet Take 1 tablet (15 mg total) by mouth daily  with supper. 30 tablet 5   rosuvastatin (CRESTOR) 10 MG tablet TAKE 1 TABLET BY MOUTH DAILY 30 tablet 6   No current facility-administered medications for this visit.    Allergies:   Oxycodone and Statins   Social History:  The patient  reports that she quit smoking about 56 years ago. Her smoking use included cigarettes. She has never used smokeless tobacco. She reports current alcohol use of about 1.0 standard drink of alcohol per week. She reports that she does not use drugs.   Family History:  The patient's family history includes Heart attack (age of onset: 6) in her father; Heart failure (age of onset: 36) in her mother.  ROS:  Please see the history of present illness.    All other systems are reviewed and otherwise negative.   PHYSICAL EXAM:  VS:  There were no vitals taken for this visit. BMI: There is no height or weight on file to calculate BMI. Well nourished, well developed, in no acute distress HEENT: normocephalic, atraumatic Neck: no JVD, carotid bruits or masses Cardiac:  *** RRR; no significant murmurs, no rubs, or gallops Lungs:  *** CTA b/l, no wheezing, rhonchi or rales Abd: soft, nontender MS: no deformity or *** atrophy Ext: *** no edema Skin: warm and dry, no rash Neuro:  No gross deficits appreciated Psych: euthymic mood, full affect  *** PPM site is stable, no tethering or discomfort   EKG:  not done today  Device interrogation done today and reviewed by myself:  ***   03/25/2021: TTE  1. In afib/flutter with RVR during study. Left ventricular ejection  fraction, by estimation, is 30 to 35%. The left ventricle has moderately  decreased function. The left ventricle demonstrates global hypokinesis.  There is mild left ventricular  hypertrophy. Left ventricular diastolic parameters are indeterminate.   2. Right ventricular systolic function is moderately reduced. The right  ventricular size is normal. There is normal pulmonary artery systolic   pressure. The estimated right ventricular systolic pressure is 16.3 mmHg.   3. Left atrial size was moderately dilated.   4. There is a Biocor tissue heart valve present in the mitral position.  Procedure Date: 11/15/2015.      Trivial mitral valve regurgitation. Echo findings are consistent with  stenosis of the mitral prosthesis.      The mean mitral valve gradient is 10.0 mmHg at 123 bpm. MVA 1.3 cm^2  by continuity equation   5. The aortic valve was not well visualized. Aortic valve regurgitation  is not visualized. Mild to moderate aortic valve stenosis. Mild AS by  gradients (MG 12 mmHg, Vmax 2.4 m/s), moderate by AVA (1.0cm^2) and DI  (0.35). Low SV index (22 cc/m^2), likely   low flow low gradient moderate AS   6. The inferior vena cava is normal in size with greater than 50%  respiratory variability, suggesting right atrial pressure of 3 mmHg.     01/13/2019: TTE 1. Left ventricular ejection fraction, by visual estimation, is 60 to  65%. The left ventricle has normal function. There is severely increased  left ventricular hypertrophy.  2. Left ventricular diastolic function could not be evaluated.   3. The left ventricle has no regional wall motion abnormalities.   4. Global right ventricle has normal systolic function.The right  ventricular size is normal. No increase in right ventricular wall  thickness.   5. Left atrial size was normal.   6. Right atrial size was normal.   7. The mitral valve has been repaired/replaced. No evidence of mitral  valve regurgitation.   8. The tricuspid valve is grossly normal. Tricuspid valve regurgitation  is trivial.   9. The aortic valve is abnormal. Aortic valve regurgitation is mild. Mild  aortic valve sclerosis without stenosis.  10. Pulmonic regurgitation is mild.  11. The pulmonic valve was normal in structure. Pulmonic valve  regurgitation is mild.  12. The atrial septum is grossly normal.    07/06/2015: LHC Ost LAD to Prox  LAD lesion, 25% stenosed. Ost 1st Diag to 1st Diag lesion, 40% stenosed. Prox RCA to Mid RCA lesion, 20% stenosed.   1. Nonobstructive CAD 2. Moderate to severe pulmonary HTN with elevated LV filling pressures. 3. Good cardiac output.  4. Mild LV gradient by pullback. Significant post PVC increase in gradient consistent with dynamic outflow obstruction.    Plan: consider surgical septal myectomy    Recent Labs: 03/22/2021: ALT 23; TSH 7.730 06/23/2021: BUN 29; Creatinine, Ser 1.52; Hemoglobin 13.8; Platelets 236; Potassium 4.7; Sodium 144  03/22/2021: Chol/HDL Ratio 3.2; Cholesterol, Total 145; HDL 46; LDL Chol Calc (NIH) 70; Triglycerides 172   CrCl cannot be calculated (Patient's most recent lab result is older than the maximum 21 days allowed.).   Wt Readings from Last 3 Encounters:  06/25/21 181 lb 7 oz (82.3 kg)  05/10/21 150 lb (68 kg)  05/05/21 183 lb (83 kg)     Other studies reviewed: Additional studies/records reviewed today include: summarized above  ASSESSMENT AND PLAN:  PPM ***  NICM ***  HOCM Hx of myomectomy 2017 VHD (MR) MVR (bioprosthetic) 2017  5. Paroxysmal AFib 6. Atypical AFlutter CHA2DS2Vasc is 4, on Xarelto, *** appropriately dosed S/p AV node ablation April 2023 *** % burden   Disposition: F/u with ***  Current medicines are reviewed at length with the patient today.  The patient did not have any concerns regarding medicines.  Venetia Night, PA-C 07/24/2021 8:17 AM     [place] [address] [phone] (office)  [phone] (fax)

## 2021-07-25 ENCOUNTER — Encounter: Payer: Medicare Other | Admitting: Physician Assistant

## 2021-07-25 ENCOUNTER — Other Ambulatory Visit: Payer: Self-pay | Admitting: Cardiology

## 2021-07-25 NOTE — Telephone Encounter (Signed)
Prescription refill request for Xarelto received.  Indication:Afib Last office visit:5/23 Weight:82.3 kg Age:87 Scr:1.5 CrCl:35.63 ml/min  Prescription refilled

## 2021-07-26 DIAGNOSIS — D485 Neoplasm of uncertain behavior of skin: Secondary | ICD-10-CM | POA: Diagnosis not present

## 2021-07-26 DIAGNOSIS — D0439 Carcinoma in situ of skin of other parts of face: Secondary | ICD-10-CM | POA: Diagnosis not present

## 2021-07-26 DIAGNOSIS — Z85828 Personal history of other malignant neoplasm of skin: Secondary | ICD-10-CM | POA: Diagnosis not present

## 2021-08-04 DIAGNOSIS — R319 Hematuria, unspecified: Secondary | ICD-10-CM | POA: Diagnosis not present

## 2021-08-09 ENCOUNTER — Ambulatory Visit (INDEPENDENT_AMBULATORY_CARE_PROVIDER_SITE_OTHER): Payer: Medicare Other

## 2021-08-09 DIAGNOSIS — I442 Atrioventricular block, complete: Secondary | ICD-10-CM

## 2021-08-09 LAB — CUP PACEART REMOTE DEVICE CHECK
Battery Remaining Longevity: 79 mo
Battery Remaining Percentage: 95.5 %
Battery Voltage: 2.99 V
Brady Statistic AP VP Percent: 85 %
Brady Statistic AP VS Percent: 1 %
Brady Statistic AS VP Percent: 15 %
Brady Statistic AS VS Percent: 1 %
Brady Statistic RA Percent Paced: 71 %
Brady Statistic RV Percent Paced: 99 %
Date Time Interrogation Session: 20230711020015
Implantable Lead Implant Date: 20230411
Implantable Lead Implant Date: 20230411
Implantable Lead Location: 753859
Implantable Lead Location: 753860
Implantable Pulse Generator Implant Date: 20230411
Lead Channel Impedance Value: 480 Ohm
Lead Channel Impedance Value: 560 Ohm
Lead Channel Pacing Threshold Amplitude: 0.75 V
Lead Channel Pacing Threshold Amplitude: 1.5 V
Lead Channel Pacing Threshold Pulse Width: 0.5 ms
Lead Channel Pacing Threshold Pulse Width: 0.5 ms
Lead Channel Sensing Intrinsic Amplitude: 0.4 mV
Lead Channel Sensing Intrinsic Amplitude: 8.8 mV
Lead Channel Setting Pacing Amplitude: 1 V
Lead Channel Setting Pacing Amplitude: 3.5 V
Lead Channel Setting Pacing Pulse Width: 0.5 ms
Lead Channel Setting Sensing Sensitivity: 4 mV
Pulse Gen Model: 2272
Pulse Gen Serial Number: 8069479

## 2021-08-11 ENCOUNTER — Encounter: Payer: Self-pay | Admitting: Internal Medicine

## 2021-08-11 ENCOUNTER — Ambulatory Visit (INDEPENDENT_AMBULATORY_CARE_PROVIDER_SITE_OTHER): Payer: Medicare Other | Admitting: Internal Medicine

## 2021-08-11 VITALS — BP 124/78 | HR 62 | Ht 62.0 in | Wt 181.2 lb

## 2021-08-11 DIAGNOSIS — I5032 Chronic diastolic (congestive) heart failure: Secondary | ICD-10-CM | POA: Diagnosis not present

## 2021-08-11 DIAGNOSIS — I484 Atypical atrial flutter: Secondary | ICD-10-CM

## 2021-08-11 NOTE — Patient Instructions (Signed)
Medication Instructions:  Your physician recommends that you continue on your current medications as directed. Please refer to the Current Medication list given to you today.  *If you need a refill on your cardiac medications before your next appointment, please call your pharmacy*  Lab Work: None ordered.  If you have labs (blood work) drawn today and your tests are completely normal, you will receive your results only by: [place] (if you have MyChart) OR A paper copy in the mail If you have any lab test that is abnormal or we need to change your treatment, we will call you to review the results.  Testing/Procedures: None ordered.  Follow-Up: At [facility] [place], you and your health needs are our priority.  As part of our continuing mission to provide you with exceptional heart care, we have created designated Provider Care Teams.  These Care Teams include your primary Cardiologist (physician) and Advanced Practice Providers (APPs -  Physician Assistants and Nurse Practitioners) who all work together to provide you with the care you need, when you need it.  We recommend signing up for the patient portal called "MyChart".  Sign up information is provided on this After Visit Summary.  MyChart is used to connect with patients for Virtual Visits (Telemedicine).  Patients are able to view lab/test results, encounter notes, upcoming appointments, etc.  Non-urgent messages can be sent to your provider as well.   To learn more about what you can do with MyChart, go to [email or web address].    Your next appointment:   1 year(s)  The format for your next appointment:   In Person  Provider:   Cristopher Peru, MD{or one of the following Advanced Practice Providers on your designated Care Team:   Tommye Standard, [place] Legrand Como "Jonni Sanger" Chalmers Cater, [place]  Remote monitoring is used to monitor your Pacemaker from home. This monitoring reduces the number of office visits required to check your device to  one time per year. It allows [place] to keep an eye on the functioning of your device to ensure it is working properly. You are scheduled for a device check from home on 11/08/21. You may send your transmission at any time that day. If you have a wireless device, the transmission will be sent automatically. After your physician reviews your transmission, you will receive a postcard with your next transmission date.  Important Information About Sugar

## 2021-08-11 NOTE — Progress Notes (Signed)
HPI Melinda Gonzales returns today for followup. She is a pleasant 87 yo woman with uncontrolled atrial fib who underwent PPM and av node ablation and then had dislodgement of her RV lead which had pulled back into the SVC. 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 Since her revision she has done well. No chest pain or sob. No syncope. No palpitations.  Allergies  Allergen Reactions   Oxycodone Nausea And Vomiting    And the sweats   Statins     Myalgias tolerates crestor     Current Outpatient Medications  Medication Sig Dispense Refill   acetaminophen (TYLENOL) 325 MG tablet Take 1-2 tablets (325-650 mg total) by mouth every 4 (four) hours as needed for mild pain.     Apoaequorin (PREVAGEN) 10 MG CAPS Take 10 mg by mouth daily.     carvedilol (COREG) 6.25 MG tablet TAKE 1 TABLET BY MOUTH TWICE A DAY 60 tablet 6   empagliflozin (JARDIANCE) 10 MG TABS tablet Take 1 tablet (10 mg total) by mouth daily before breakfast. 90 tablet 3   ENTRESTO 24-26 MG TAKE 1 TABLET BY MOUTH TWICE A DAY 60 tablet 10   furosemide (LASIX) 40 MG tablet TAKE 1 TABLET BY MOUTH DAILY 30 tablet 8   latanoprost (XALATAN) 0.005 % ophthalmic solution Place 1 drop into both eyes at bedtime.     Menthol-Methyl Salicylate (SALONPAS PAIN RELIEF PATCH) PTCH Apply 1 packet topically daily as needed (pain).     Multiple Vitamins-Minerals (PRESERVISION AREDS 2) CAPS Take 1 capsule by mouth 2 (two) times daily.     potassium chloride SA (KLOR-CON M) 20 MEQ tablet TAKE 1 TABLET BY MOUTH DAILY 30 tablet 6   rosuvastatin (CRESTOR) 10 MG tablet TAKE 1 TABLET BY MOUTH DAILY 30 tablet 6   XARELTO 15 MG TABS tablet TAKE 1 TABLET BY MOUTH DAILY WITH SUPPER 30 tablet 5   No current facility-administered medications for this visit.     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   Past Surgical History:  Procedure Laterality Date   AV NODE ABLATION N/A 05/10/2021   Procedure: AV NODE ABLATION;  Surgeon: Evans Lance, MD;  Location: [place] [facility];  Service: Cardiovascular;  Laterality: N/A;   CARDIAC CATHETERIZATION N/A 07/06/2015   Procedure: Right/Left Heart Cath and Coronary Angiography;  Surgeon: Peter M Martinique, MD;  Location: [place] [facility];  Service: Cardiovascular;  Laterality: N/A;   CARDIOVERSION N/A 10/23/2018   Procedure: CARDIOVERSION;  Surgeon: Donato Heinz, MD;  [facility] END[facility];  Service: Endoscopy;  Laterality: N/A;   CARDIOVERSION N/A 03/29/2021   Procedure: CARDIOVERSION;  Surgeon: Sanda Klein, MD;  Location: [facility] ENDOSCOPY;  Service: Cardiovascular;  Laterality: N/A;   LEAD REVISION/REPAIR N/A 06/24/2021   Procedure: LEAD REVISION/REPAIR;  Surgeon: Evans Lance, MD;  Location: [place] [facility];  Service: Cardiovascular;  Laterality: N/A;   MITRAL VALVE REPLACEMENT N/A 11/15/2015   ligation of left atrial appendage, maze procedure  at [place] 05/10/2021   Procedure: PACEMAKER IMPLANT;  Surgeon: Evans Lance, MD;  Location: [place] [facility];  Service: Cardiovascular;  Laterality: N/A;     Family History  Problem Relation Age of Onset   Heart failure Mother 82   Heart attack Father 64     Social History   Socioeconomic History   Marital status: Married    Spouse name: Not on file   Number of children: 2   Years of education: Not on file   Highest education level: Not on file  Occupational History    Employer: RETIRED    Comment: retired  Tobacco Use   Smoking status: Former     Types: Cigarettes    Quit date: 05/26/1965    Years since quitting: 56.2   Smokeless tobacco: Never  Vaping Use   Vaping Use: Never used  Substance and Sexual Activity   Alcohol use: Yes    Alcohol/week: 1.0 standard drink of alcohol    Types: 1 Standard drinks or equivalent per week    Comment: socially   Drug use: No   Sexual activity: Yes  Other Topics Concern   Not on file  Social History Narrative   Not on file   Social Determinants of Health   Financial Resource Strain: Not on file  Food Insecurity: Not on file  Transportation Needs: Not on file  Physical Activity: Not on file  Stress: Not on file  Social Connections: Not on file  Intimate Partner Violence: Not on file     BP 124/78   Pulse 62   Ht '5\' 2"'$  (1.575 m)   Wt 181 lb 3.2 oz (82.2 kg)   SpO2 95%   BMI 33.14 kg/m   Physical Exam:  Well appearing NAD HEENT: Unremarkable Neck:  No JVD, no thyromegally Lymphatics:  No adenopathy Back:  No CVA tenderness Lungs:  Clear with no wheezes HEART:  Regular rate rhythm, no murmurs, no rubs, no clicks Abd:  soft, positive bowel sounds, no organomegally, no rebound, no guarding Ext:  2 plus pulses, no edema, no cyanosis, no clubbing Skin:  No rashes no nodules Neuro:  CN II through XII intact, motor grossly intact  EKG - NSR with ventricular pacing  DEVICE  Normal device function.  See PaceArt for details.   Assess/Plan:  Persistent atrial fib - she is maintaining NSR.  CHB - she is asymptomatic s/p PPM insertion. Obesity - her bmi is down a bit. We will follow. HTN - her bp is controlled today. Continue current meds.  Carleene Overlie Geniva Lohnes,MD

## 2021-08-31 DIAGNOSIS — R319 Hematuria, unspecified: Secondary | ICD-10-CM | POA: Diagnosis not present

## 2021-08-31 DIAGNOSIS — N1832 Chronic kidney disease, stage 3b: Secondary | ICD-10-CM | POA: Diagnosis not present

## 2021-08-31 DIAGNOSIS — R7303 Prediabetes: Secondary | ICD-10-CM | POA: Diagnosis not present

## 2021-08-31 NOTE — Progress Notes (Signed)
Remote pacemaker transmission.   

## 2021-09-01 ENCOUNTER — Other Ambulatory Visit: Payer: Self-pay | Admitting: Internal Medicine

## 2021-09-01 DIAGNOSIS — R319 Hematuria, unspecified: Secondary | ICD-10-CM

## 2021-09-07 NOTE — Addendum Note (Signed)
Addended by: Michelle Nasuti on: 09/07/2021 11:42 AM   Modules accepted: Orders

## 2021-09-28 ENCOUNTER — Ambulatory Visit
Admission: RE | Admit: 2021-09-28 | Discharge: 2021-09-28 | Disposition: A | Discharge status: Home / Self Care | Payer: Medicare Other | Source: Ambulatory Visit | Attending: Internal Medicine | Admitting: Internal Medicine

## 2021-09-28 DIAGNOSIS — R319 Hematuria, unspecified: Secondary | ICD-10-CM | POA: Diagnosis not present

## 2021-09-28 DIAGNOSIS — N200 Calculus of kidney: Secondary | ICD-10-CM | POA: Diagnosis not present

## 2021-09-28 DIAGNOSIS — K5730 Diverticulosis of large intestine without perforation or abscess without bleeding: Secondary | ICD-10-CM | POA: Diagnosis not present

## 2021-09-28 DIAGNOSIS — K449 Diaphragmatic hernia without obstruction or gangrene: Secondary | ICD-10-CM | POA: Diagnosis not present

## 2021-09-28 MED ORDER — IOPAMIDOL (ISOVUE-300) INJECTION 61%
125.0000 mL | Freq: Once | INTRAVENOUS | Status: AC | PRN
  Administered 2021-09-28: 70 mL via INTRAVENOUS

## 2021-10-19 DIAGNOSIS — H6123 Impacted cerumen, bilateral: Secondary | ICD-10-CM | POA: Diagnosis not present

## 2021-11-08 ENCOUNTER — Ambulatory Visit (INDEPENDENT_AMBULATORY_CARE_PROVIDER_SITE_OTHER): Payer: Medicare Other

## 2021-11-08 DIAGNOSIS — I442 Atrioventricular block, complete: Secondary | ICD-10-CM

## 2021-11-08 LAB — CUP PACEART REMOTE DEVICE CHECK
Battery Remaining Longevity: 100 mo
Battery Remaining Percentage: 95.5 %
Battery Voltage: 2.99 V
Brady Statistic AP VP Percent: 94 %
Brady Statistic AP VS Percent: 1 %
Brady Statistic AS VP Percent: 6 %
Brady Statistic AS VS Percent: 1 %
Brady Statistic RA Percent Paced: 84 %
Brady Statistic RV Percent Paced: 99 %
Date Time Interrogation Session: 20231010020015
Implantable Lead Implant Date: 20230411
Implantable Lead Implant Date: 20230411
Implantable Lead Location: 753859
Implantable Lead Location: 753860
Implantable Pulse Generator Implant Date: 20230411
Lead Channel Impedance Value: 430 Ohm
Lead Channel Impedance Value: 530 Ohm
Lead Channel Pacing Threshold Amplitude: 1.75 V
Lead Channel Pacing Threshold Amplitude: 2.125 V
Lead Channel Pacing Threshold Pulse Width: 0.5 ms
Lead Channel Pacing Threshold Pulse Width: 0.5 ms
Lead Channel Sensing Intrinsic Amplitude: 0.6 mV
Lead Channel Sensing Intrinsic Amplitude: 5.1 mV
Lead Channel Setting Pacing Amplitude: 2 V
Lead Channel Setting Pacing Amplitude: 2.375
Lead Channel Setting Pacing Pulse Width: 0.5 ms
Lead Channel Setting Sensing Sensitivity: 4 mV
Pulse Gen Model: 2272
Pulse Gen Serial Number: 8069479

## 2021-11-14 DIAGNOSIS — Z23 Encounter for immunization: Secondary | ICD-10-CM | POA: Diagnosis not present

## 2021-11-23 DIAGNOSIS — H2511 Age-related nuclear cataract, right eye: Secondary | ICD-10-CM | POA: Diagnosis not present

## 2021-11-23 DIAGNOSIS — Z961 Presence of intraocular lens: Secondary | ICD-10-CM | POA: Diagnosis not present

## 2021-11-23 DIAGNOSIS — H353132 Nonexudative age-related macular degeneration, bilateral, intermediate dry stage: Secondary | ICD-10-CM | POA: Diagnosis not present

## 2021-11-23 DIAGNOSIS — H4720 Unspecified optic atrophy: Secondary | ICD-10-CM | POA: Diagnosis not present

## 2021-11-23 DIAGNOSIS — H401123 Primary open-angle glaucoma, left eye, severe stage: Secondary | ICD-10-CM | POA: Diagnosis not present

## 2021-11-23 DIAGNOSIS — H47012 Ischemic optic neuropathy, left eye: Secondary | ICD-10-CM | POA: Diagnosis not present

## 2021-11-23 DIAGNOSIS — H47011 Ischemic optic neuropathy, right eye: Secondary | ICD-10-CM | POA: Diagnosis not present

## 2021-11-23 NOTE — Progress Notes (Signed)
Remote pacemaker transmission.   

## 2021-12-07 DIAGNOSIS — C801 Malignant (primary) neoplasm, unspecified: Secondary | ICD-10-CM | POA: Diagnosis not present

## 2021-12-07 DIAGNOSIS — K219 Gastro-esophageal reflux disease without esophagitis: Secondary | ICD-10-CM | POA: Diagnosis not present

## 2021-12-07 DIAGNOSIS — E7800 Pure hypercholesterolemia, unspecified: Secondary | ICD-10-CM | POA: Diagnosis not present

## 2021-12-07 DIAGNOSIS — I420 Dilated cardiomyopathy: Secondary | ICD-10-CM | POA: Diagnosis not present

## 2021-12-07 DIAGNOSIS — I4891 Unspecified atrial fibrillation: Secondary | ICD-10-CM | POA: Diagnosis not present

## 2021-12-07 DIAGNOSIS — D6869 Other thrombophilia: Secondary | ICD-10-CM | POA: Diagnosis not present

## 2021-12-07 DIAGNOSIS — Z0000 Encounter for general adult medical examination without abnormal findings: Secondary | ICD-10-CM | POA: Diagnosis not present

## 2021-12-07 DIAGNOSIS — I421 Obstructive hypertrophic cardiomyopathy: Secondary | ICD-10-CM | POA: Diagnosis not present

## 2021-12-07 DIAGNOSIS — E669 Obesity, unspecified: Secondary | ICD-10-CM | POA: Diagnosis not present

## 2021-12-07 DIAGNOSIS — E1122 Type 2 diabetes mellitus with diabetic chronic kidney disease: Secondary | ICD-10-CM | POA: Diagnosis not present

## 2021-12-07 DIAGNOSIS — N1832 Chronic kidney disease, stage 3b: Secondary | ICD-10-CM | POA: Diagnosis not present

## 2021-12-07 DIAGNOSIS — R7303 Prediabetes: Secondary | ICD-10-CM | POA: Diagnosis not present

## 2021-12-08 DIAGNOSIS — Y712 Prosthetic and other implants, materials and accessory cardiovascular devices associated with adverse incidents: Secondary | ICD-10-CM | POA: Diagnosis not present

## 2021-12-08 DIAGNOSIS — I420 Dilated cardiomyopathy: Secondary | ICD-10-CM | POA: Diagnosis not present

## 2021-12-08 DIAGNOSIS — E7800 Pure hypercholesterolemia, unspecified: Secondary | ICD-10-CM | POA: Diagnosis not present

## 2021-12-08 DIAGNOSIS — I421 Obstructive hypertrophic cardiomyopathy: Secondary | ICD-10-CM | POA: Diagnosis not present

## 2021-12-08 DIAGNOSIS — N183 Chronic kidney disease, stage 3 unspecified: Secondary | ICD-10-CM | POA: Diagnosis not present

## 2021-12-08 DIAGNOSIS — I4891 Unspecified atrial fibrillation: Secondary | ICD-10-CM | POA: Diagnosis not present

## 2021-12-08 DIAGNOSIS — D6869 Other thrombophilia: Secondary | ICD-10-CM | POA: Diagnosis not present

## 2021-12-08 DIAGNOSIS — Z7901 Long term (current) use of anticoagulants: Secondary | ICD-10-CM | POA: Diagnosis not present

## 2021-12-08 DIAGNOSIS — R7303 Prediabetes: Secondary | ICD-10-CM | POA: Diagnosis not present

## 2021-12-14 ENCOUNTER — Other Ambulatory Visit: Payer: Self-pay | Admitting: Cardiology

## 2021-12-29 ENCOUNTER — Telehealth: Payer: Self-pay | Admitting: Cardiology

## 2021-12-29 NOTE — Telephone Encounter (Signed)
Patient's spouse called requesting to speak with Malachy Mood about appt for his wife.

## 2021-12-29 NOTE — Telephone Encounter (Signed)
Spoke to patient advised she is due to see Dr.Jordan.Appointment scheduled 12/19 at 3:00 pm.

## 2022-01-13 NOTE — Progress Notes (Unsigned)
Melinda Gonzales Date of Birth: 1934/11/20   History of Present Illness: Melinda Gonzales is seen for follow up of atrial flutter and CHF  She was evaluated in 2011 with a Myoview stress test which was normal. She had an Echo at that time that showed moderate LVH with EF 65-70%. Mild MR. She does have a history of murmur. She is not a smoker. In early 2016 she presented with increased dyspnea and an Echo and cardiac MRI were done. These with both consistent with HOCM. LVOT gradient of 112 mm Hg at rest. She was started on low dose lasix and Toprol XL with good response initially with improved dyspnea.  Seen last year with symptoms of increased dyspnea. No increased edema or orthopnea but more dyspnea with walking or lifting. No chest pain. No dizziness or palpitations. Energy level has decreased. Repeat Echo showed severe LVOT gradient and MR.  She underwent cardiac cath with findings of LVOT gradient and MR. No significant CAD. Pulmonary HTN. She was referred to the [facility] clinic and underwent surgery on 11/05/15. Prior to surgery she developed Afib and had a DCCV. Surgery  included a septal myectomy, MVR with a #29 mm [employer]. Jude  Biocor valve, left sided Cryo MAZE, and ligation of the left atrial appendage. Her post op course was remarkable for paroxysmal atrial fibrillation and she was started on amiodarone and Xarelto.  Ecg showed a LBBB. Repeat Echo on 11/11/15 showed normal LV function with EF 60%. Mean MV gradient 8 mm Hg. Trace MR. Mild TR. There was a fixed LVOT gradient of 32 mm Hg peak and mean of 16 mm Hg. Repeat Echo 02/03/16 showed no LVOT gradient and normal MV prosthesis function. There was some concern that amiodarone caused her to have an optic nerve problem with vision loss - this was discontinued.   She was seen in September 2020 with symptoms of persistent tachycardia and SOB. HR in the 120s. Echo was obtained and showed significant decline in LV function with EF 25-30%. After review  of Ecg tracings and reviewing with EP it appeared she was in Atrial flutter. BNP was 467. TSH was mildly elevated 6.0. She was started on Coreg. Lasix was increased she was on Xarelto. She underwent  DCCV on 10/23/18. Subsequent Echo in December showed EF had returned to normal.   When seen in February she was back in Atrial flutter- atypical. Once again this was associated with significant drop in EF to 30-35%. Mitral prosthesis was functioning well. Mild to moderate AS. She underwent successful DCCV on 03/29/21. Was seen by Dr Lovena Le afterwards on 04/01/21 and was in NSR. He felt there were limited options for AAD therapy given intolerance to amiodarone. Did not feel she was a good candidate for ablation at her age and comorbidities. Consideration of AV node ablation and BiV pacer if flutter recurs or if EF does not recover.   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 Last pader check in October was functioning well with Afib burden of 16%.   On follow up today she is seen with her husband. She notes that she has SOB if she walks very far. She has gained 7 lbs. No increase in edema. No chest pain. Only palpitation when she is aggravated. She does drink sodas and eats too many sweets.    Current Outpatient Medications on File Prior to Visit  Medication Sig Dispense Refill   acetaminophen (TYLENOL) 325 MG tablet Take 1-2  tablets (325-650 mg total) by mouth every 4 (four) hours as needed for mild pain.     Apoaequorin (PREVAGEN) 10 MG CAPS Take 10 mg by mouth daily.     carvedilol (COREG) 6.25 MG tablet TAKE 1 TABLET BY MOUTH TWICE A DAY 60 tablet 6   empagliflozin (JARDIANCE) 10 MG TABS tablet Take 1 tablet (10 mg total) by mouth daily before breakfast. 90 tablet 3   ENTRESTO 24-26 MG TAKE 1 TABLET BY MOUTH TWICE A DAY 60 tablet 10   furosemide (LASIX) 40 MG tablet TAKE 1 TABLET BY MOUTH DAILY 30 tablet 8   latanoprost (XALATAN) 0.005 % ophthalmic solution Place 1 drop  into both eyes at bedtime.     Menthol-Methyl Salicylate (SALONPAS PAIN RELIEF PATCH) PTCH Apply 1 packet topically daily as needed (pain).     Multiple Vitamins-Minerals (PRESERVISION AREDS 2) CAPS Take 1 capsule by mouth 2 (two) times daily.     potassium chloride SA (KLOR-CON M) 20 MEQ tablet TAKE 1 TABLET BY MOUTH DAILY 30 tablet 6   rosuvastatin (CRESTOR) 10 MG tablet TAKE 1 TABLET BY MOUTH DAILY 30 tablet 6   XARELTO 15 MG TABS tablet TAKE 1 TABLET BY MOUTH DAILY WITH SUPPER 30 tablet 5   No current facility-administered medications on file prior to visit.    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   SOB (shortness of breath)     Past Surgical History:  Procedure Laterality Date   AV NODE ABLATION N/A 05/10/2021   Procedure: AV NODE ABLATION;  Surgeon: Evans Lance, MD;  Location: [place] [facility];  Service: Cardiovascular;  Laterality: N/A;   CARDIAC CATHETERIZATION N/A 07/06/2015   Procedure: Right/Left Heart Cath and Coronary Angiography;  Surgeon: Naitik Hermann M Martinique, MD;  Location: [place] [facility];  Service: Cardiovascular;  Laterality: N/A;   CARDIOVERSION N/A 10/23/2018   Procedure: CARDIOVERSION;  Surgeon: Donato Heinz, MD;  Location: [facility] ENDOSCOPY;  Service: Endoscopy;  Laterality: N/A;   CARDIOVERSION N/A 03/29/2021   Procedure: CARDIOVERSION;  Surgeon: Sanda Klein, MD;  Location: [facility] ENDOSCOPY;  Service: Cardiovascular;  Laterality: N/A;   LEAD REVISION/REPAIR N/A 06/24/2021   Procedure: LEAD REVISION/REPAIR;  Surgeon: Evans Lance, MD;  Location: [place] [facility];  Service: Cardiovascular;  Laterality: N/A;   MITRAL VALVE REPLACEMENT N/A 11/15/2015   ligation of left atrial appendage, maze procedure at [place] 05/10/2021   Procedure: PACEMAKER IMPLANT;  Surgeon: Evans Lance, MD;  Location: [place] [facility];  Service: Cardiovascular;  Laterality: N/A;    Social History   Tobacco Use  Smoking Status Former   Types: Cigarettes   Quit date: 05/26/1965   Years since quitting: 56.6  Smokeless Tobacco Never    Social History   Substance and Sexual Activity  Alcohol Use Yes   Alcohol/week: 1.0 standard drink of alcohol   Types: 1 Standard drinks or equivalent per week   Comment: socially    Family History  Problem Relation Age of  Onset   Heart failure Mother 98   Heart attack Father 20    Review of Systems: As noted in HPI.   All other systems were reviewed and are negative.  Physical Exam: BP 110/64   Pulse 75   Ht '5\' 2"'$  (1.575 m)   Wt 188 lb 9.6 oz (85.5 kg)   SpO2 94%   BMI 34.50 kg/m   Body mass index is 34.5 kg/m. GENERAL:  Well appearing obese WF in NAD HEENT:  PERRL, EOMI, sclera are clear. Oropharynx is clear. NECK:  No jugular venous distention, carotid upstroke brisk and symmetric, no bruits, no thyromegaly or adenopathy LUNGS:  Clear to auscultation bilaterally CHEST:  Unremarkable HEART:  RRR- tachy.   PMI not displaced or sustained,S1 and S2 within normal limits, no S3, no S4: no clicks, no rubs, gr 1/6 systolic murmur at the apex>> RUSB ABD:  Soft, nontender. BS +, no masses or bruits. No hepatomegaly, no splenomegaly EXT:  2 + pulses throughout, tr edema, no cyanosis no clubbing SKIN:  Warm and dry.  No rashes NEURO:  Alert and oriented x 3. Cranial nerves II through XII intact. PSYCH:  Cognitively intact    LABORATORY DATA:   Lab Results  Component Value Date   WBC 8.8 06/23/2021   HGB 13.8 06/23/2021   HCT 42.1 06/23/2021   PLT 236 06/23/2021    GLUCOSE 112 (H) 06/23/2021   CHOL 145 03/22/2021   TRIG 172 (H) 03/22/2021   HDL 46 03/22/2021   LDLCALC 70 03/22/2021   ALT 23 03/22/2021   AST 19 03/22/2021   NA 144 06/23/2021   K 4.7 06/23/2021   CL 108 (H) 06/23/2021   CREATININE 1.52 (H) 06/23/2021   BUN 29 (H) 06/23/2021   CO2 16 (L) 06/23/2021   TSH 7.730 (H) 03/22/2021   INR 1.03 07/06/2015   Labs dated 03/11/16: creatinine 1.6 Dated 07/27/16: cholesterol 192, triglycerides 165, HDL 52, LDL 107. Creatinine 1.27. Potassium and ALT normal.  Dated 02/07/17: BUN 27, creatinine 1.25. Glucose 113. Other chemistries normal. Cholesterol 199, triglycerides 212, HDL 44, LDL 113.  Dated 02/08/18: cholesterol 161, triglycerides 209, HDL 44, LDL 75. LFTs normal. Dated 10/13/19: A1c 6.7%. triglycerides 269, HDL 39. Unable to see other results in [place] Dated 01/13/20: BUN 33, creatinine 1.76. glucose 137. Potassium 5.1. sodium 144. A1c 6.4% Dated 12/03/20: cholesterol 151, triglycerides 185, HDL 42, LDL 78. A1c 6.5%. BUN 37, creatinine 1.67. otherwise CMET normal. Dated 12/08/21: cholesterol 143, triglycerides 279, HDL 37, LDL 61, A1c 6.6%. creatinine 1.72. otherwise CMET and CBC normal.  Ecg not done today. Last Ecg in July showed AFib/flutter with controlled rate.    Echo 02/03/16: Study Conclusions   - Left ventricle: The cavity size was normal. There was moderate   concentric hypertrophy. Systolic function was normal. The   estimated ejection fraction was in the range of 60% to 65%. Wall   motion was normal; there were no regional wall motion   abnormalities. The study is not technically sufficient to allow   evaluation of LV diastolic function due to the presence of a   bioprosthetic mitral valve. - Aortic valve: Valve mobility was mildly restricted. There was   mild stenosis. There was mild regurgitation. - Mitral valve: Mildly calcified annulus. A 70m St. Jude biocor   bioprosthesis was present and functioning well. - Left atrium: The  atrium was severely dilated. - Right ventricle: The cavity size was normal. Wall thickness was   normal. Systolic function  was normal. - Tricuspid valve: There was mild regurgitation. - Pulmonary arteries: Systolic pressure was mildly increased. PA   peak pressure: 42 mm Hg (S).   Impressions:   - Since the last echo 06/15/2015, a bioprosthetic mitral valve is   present and functioning well. Septal wall thickness is now 1.5 cm   compared with 1.8 cm pre-operatively.  Echo 04/19/17: Study Conclusions   - Valve surgery. Mitral valve replacement with a [employer]   porcine valve. 10m Biocor bioprosthetic valve. - Left ventricle: The cavity size was normal. Wall thickness was   increased in a pattern of mild LVH. Systolic function was normal.   The estimated ejection fraction was in the range of 55% to 60%.   Wall motion was normal; there were no regional wall motion   abnormalities. Doppler parameters are consistent with a   reversible restrictive pattern, indicative of decreased left   ventricular diastolic compliance and/or increased left atrial   pressure (grade 3 diastolic dysfunction). - Aortic valve: There was trivial regurgitation. Mean gradient (S):   11 mm Hg. Peak gradient (S): 19 mm Hg. - Mitral valve: A bioprosthesis was present. The prosthesis had a   normal range of motion. Mean gradient (D): 5 mm Hg. - Left atrium: The atrium was mildly dilated. - Pulmonary arteries: Systolic pressure was moderately increased.   PA peak pressure: 56 mm Hg (S).  Echocardiogram 10/15/2018: 1. The left ventricle has severely reduced systolic function, with an ejection fraction of 25-30%. The cavity size was normal. Left ventricular diastolic function could not be evaluated. Elevated mean left atrial pressure Left ventricular diffuse  hypokinesis.  2. The right ventricle has moderately reduced systolic function. The cavity was normal. Right ventricular systolic pressure is mildly  elevated with an estimated pressure of 41.8 mmHg.  3. Left atrial size was moderately dilated.  4. The tricuspid valve is grossly normal. Tricuspid valve regurgitation is moderate.  5. The aortic valve has an indeterminate number of cusps. Mild calcification of the aortic valve. Aortic valve regurgitation is trivial by color flow Doppler. No stenosis of the aortic valve.  6. The aorta is normal unless otherwise noted.  7. The inferior vena cava was dilated in size with >50% respiratory variability.  8. Septal dyssynergy and global hypokinesis; overall severe LV dysfunction; trace AI; s/p MVR with no MR; moderate LAE; moderate RV dysfunction; moderate TR and mild pulmonary hypertension.  Echo: 01/13/19: IMPRESSIONS     1. Left ventricular ejection fraction, by visual estimation, is 60 to  65%. The left ventricle has normal function. There is severely increased  left ventricular hypertrophy.   2. Left ventricular diastolic function could not be evaluated.   3. The left ventricle has no regional wall motion abnormalities.   4. Global right ventricle has normal systolic function.The right  ventricular size is normal. No increase in right ventricular wall  thickness.   5. Left atrial size was normal.   6. Right atrial size was normal.   7. The mitral valve has been repaired/replaced. No evidence of mitral  valve regurgitation.   8. The tricuspid valve is grossly normal. Tricuspid valve regurgitation  is trivial.   9. The aortic valve is abnormal. Aortic valve regurgitation is mild. Mild  aortic valve sclerosis without stenosis.  10. Pulmonic regurgitation is mild.  11. The pulmonic valve was normal in structure. Pulmonic valve  regurgitation is mild.  12. The atrial septum is grossly normal.    Echo 03/25/21: IMPRESSIONS  1. In afib/flutter with RVR during study. Left ventricular ejection  fraction, by estimation, is 30 to 35%. The left ventricle has moderately  decreased function.  The left ventricle demonstrates global hypokinesis.  There is mild left ventricular  hypertrophy. Left ventricular diastolic parameters are indeterminate.   2. Right ventricular systolic function is moderately reduced. The right  ventricular size is normal. There is normal pulmonary artery systolic  pressure. The estimated right ventricular systolic pressure is 45.3 mmHg.   3. Left atrial size was moderately dilated.   4. There is a Biocor tissue heart valve present in the mitral position.  Procedure Date: 11/15/2015.      Trivial mitral valve regurgitation. Echo findings are consistent with  stenosis of the mitral prosthesis.      The mean mitral valve gradient is 10.0 mmHg at 123 bpm. MVA 1.3 cm^2  by continuity equation   5. The aortic valve was not well visualized. Aortic valve regurgitation  is not visualized. Mild to moderate aortic valve stenosis. Mild AS by  gradients (MG 12 mmHg, Vmax 2.4 m/s), moderate by AVA (1.0cm^2) and DI  (0.35). Low SV index (22 cc/m^2), likely   low flow low gradient moderate AS   6. The inferior vena cava is normal in size with greater than 50%  respiratory variability, suggesting right atrial pressure of 3 mmHg.    Assessment / Plan: 1. HOCM.  s/p septal myectomy on 11/05/15. Excellent result by follow up Echo. No significant residual LVOT gradient.   2. Severe MR s/p MVR with #29 St. Jude Biocor valve. Routine SBE prophylaxis.   3.  Chronic systolic CHF. LV dysfunction associated in the past with atrial flutter. EF 25-30%. Likely tachycardia mediated with new Atrial flutter with RVR. After DCCV EF  normalized based on Echo in December 2020.  Last February had recurrent Atrial flutter EF is back down to 30-35%. Has since had DDD pacer in place. This reports Afib burden of 16%. Would like to repeat Echo now post pacer implant. If EF still low consider increase in [place]. Not a good candidate for aldactone with CKD.   4. Atrial flutter sustained.  post  op open heart surgery. S/p left sided MAZE. Seen in September 2020 with  new onset Atrial flutter.  DCCV on 10/23/18. Had been on amiodarone post op heart surgery. Patient questions if this was the cause of her optic nerve problem. Has been on Xarelto long term and hasn't missed any doses. Now with recurrence in Feb this year. S/p DCCV. Seen by Dr Lovena Le- poor candidate for AAD therapy or ablation. Now s/p pacemaker implant. Rate controlled. Little else to offer.   5. Hypercholesterolemia. On low dose Crestor. No history of CAD. LDL 61.   6. CKD stage 3.   7. Obesity. Discussed weight loss strategy of limiting Etoh intake and restricting carbohydrate/sweet  intake.    Follow up in 4 months.

## 2022-01-17 ENCOUNTER — Encounter: Payer: Self-pay | Admitting: Cardiology

## 2022-01-17 ENCOUNTER — Ambulatory Visit: Payer: Medicare Other | Attending: Cardiology | Admitting: Cardiology

## 2022-01-17 VITALS — BP 110/64 | HR 75 | Ht 62.0 in | Wt 188.6 lb

## 2022-01-17 DIAGNOSIS — Z952 Presence of prosthetic heart valve: Secondary | ICD-10-CM | POA: Insufficient documentation

## 2022-01-17 DIAGNOSIS — I421 Obstructive hypertrophic cardiomyopathy: Secondary | ICD-10-CM | POA: Diagnosis not present

## 2022-01-17 DIAGNOSIS — I5032 Chronic diastolic (congestive) heart failure: Secondary | ICD-10-CM | POA: Insufficient documentation

## 2022-01-17 DIAGNOSIS — I484 Atypical atrial flutter: Secondary | ICD-10-CM | POA: Diagnosis not present

## 2022-01-17 NOTE — Patient Instructions (Signed)
Medication Instructions:  Continue same medications *If you need a refill on your cardiac medications before your next appointment, please call your pharmacy*   Lab Work: None ordered   Testing/Procedures: Echo   Follow-Up: At [facility], you and your health needs are our priority.  As part of our continuing mission to provide you with exceptional heart care, we have created designated Provider Care Teams.  These Care Teams include your primary Cardiologist (physician) and Advanced Practice Providers (APPs -  Physician Assistants and Nurse Practitioners) who all work together to provide you with the care you need, when you need it.  We recommend signing up for the patient portal called "MyChart".  Sign up information is provided on this After Visit Summary.  MyChart is used to connect with patients for Virtual Visits (Telemedicine).  Patients are able to view lab/test results, encounter notes, upcoming appointments, etc.  Non-urgent messages can be sent to your provider as well.   To learn more about what you can do with MyChart, go to [email or web address].    Your next appointment:  4 months    The format for your next appointment: Office    Provider: Hanna

## 2022-02-07 ENCOUNTER — Ambulatory Visit (INDEPENDENT_AMBULATORY_CARE_PROVIDER_SITE_OTHER): Payer: Medicare Other

## 2022-02-07 DIAGNOSIS — I442 Atrioventricular block, complete: Secondary | ICD-10-CM | POA: Diagnosis not present

## 2022-02-07 LAB — CUP PACEART REMOTE DEVICE CHECK
Battery Remaining Longevity: 102 mo
Battery Remaining Percentage: 94 %
Battery Voltage: 2.99 V
Brady Statistic AP VP Percent: 96 %
Brady Statistic AP VS Percent: 1 %
Brady Statistic AS VP Percent: 3.9 %
Brady Statistic AS VS Percent: 1 %
Brady Statistic RA Percent Paced: 88 %
Brady Statistic RV Percent Paced: 99 %
Date Time Interrogation Session: 20240109112053
Implantable Lead Connection Status: 753985
Implantable Lead Connection Status: 753985
Implantable Lead Implant Date: 20230411
Implantable Lead Implant Date: 20230411
Implantable Lead Location: 753859
Implantable Lead Location: 753860
Implantable Pulse Generator Implant Date: 20230411
Lead Channel Impedance Value: 390 Ohm
Lead Channel Impedance Value: 540 Ohm
Lead Channel Pacing Threshold Amplitude: 1 V
Lead Channel Pacing Threshold Amplitude: 1.75 V
Lead Channel Pacing Threshold Pulse Width: 0.5 ms
Lead Channel Pacing Threshold Pulse Width: 0.5 ms
Lead Channel Sensing Intrinsic Amplitude: 0.9 mV
Lead Channel Sensing Intrinsic Amplitude: 5 mV
Lead Channel Setting Pacing Amplitude: 1.25 V
Lead Channel Setting Pacing Amplitude: 2 V
Lead Channel Setting Pacing Pulse Width: 0.5 ms
Lead Channel Setting Sensing Sensitivity: 4 mV
Pulse Gen Model: 2272
Pulse Gen Serial Number: 8069479

## 2022-02-08 ENCOUNTER — Other Ambulatory Visit: Payer: Self-pay | Admitting: Cardiology

## 2022-02-20 ENCOUNTER — Ambulatory Visit (HOSPITAL_COMMUNITY): Payer: Medicare Other | Attending: Cardiovascular Disease

## 2022-02-20 DIAGNOSIS — Z952 Presence of prosthetic heart valve: Secondary | ICD-10-CM | POA: Insufficient documentation

## 2022-02-20 DIAGNOSIS — I421 Obstructive hypertrophic cardiomyopathy: Secondary | ICD-10-CM | POA: Insufficient documentation

## 2022-02-20 DIAGNOSIS — I5032 Chronic diastolic (congestive) heart failure: Secondary | ICD-10-CM | POA: Diagnosis not present

## 2022-02-20 LAB — ECHOCARDIOGRAM COMPLETE
AR max vel: 1.23 cm2
AV Area VTI: 1.31 cm2
AV Area mean vel: 1.06 cm2
AV Mean grad: 13 mmHg
AV Peak grad: 22.3 mmHg
Ao pk vel: 2.36 m/s
Area-P 1/2: 2.01 cm2
MV VTI: 1.37 cm2
P 1/2 time: 944 msec
S' Lateral: 3.4 cm

## 2022-02-21 ENCOUNTER — Encounter: Payer: Self-pay | Admitting: *Deleted

## 2022-03-03 NOTE — Progress Notes (Signed)
Remote pacemaker transmission.   

## 2022-04-24 ENCOUNTER — Other Ambulatory Visit: Payer: Self-pay | Admitting: Cardiology

## 2022-05-09 ENCOUNTER — Ambulatory Visit (INDEPENDENT_AMBULATORY_CARE_PROVIDER_SITE_OTHER): Payer: Medicare Other

## 2022-05-09 DIAGNOSIS — I442 Atrioventricular block, complete: Secondary | ICD-10-CM | POA: Diagnosis not present

## 2022-05-10 LAB — CUP PACEART REMOTE DEVICE CHECK
Battery Remaining Longevity: 98 mo
Battery Remaining Percentage: 91 %
Battery Voltage: 2.99 V
Brady Statistic AP VP Percent: 97 %
Brady Statistic AP VS Percent: 1 %
Brady Statistic AS VP Percent: 3.2 %
Brady Statistic AS VS Percent: 1 %
Brady Statistic RA Percent Paced: 91 %
Brady Statistic RV Percent Paced: 99 %
Date Time Interrogation Session: 20240409020012
Implantable Lead Connection Status: 753985
Implantable Lead Connection Status: 753985
Implantable Lead Implant Date: 20230411
Implantable Lead Implant Date: 20230411
Implantable Lead Location: 753859
Implantable Lead Location: 753860
Implantable Pulse Generator Implant Date: 20230411
Lead Channel Impedance Value: 400 Ohm
Lead Channel Impedance Value: 580 Ohm
Lead Channel Pacing Threshold Amplitude: 1.25 V
Lead Channel Pacing Threshold Amplitude: 1.75 V
Lead Channel Pacing Threshold Pulse Width: 0.5 ms
Lead Channel Pacing Threshold Pulse Width: 0.5 ms
Lead Channel Sensing Intrinsic Amplitude: 0.6 mV
Lead Channel Sensing Intrinsic Amplitude: 5 mV
Lead Channel Setting Pacing Amplitude: 1.5 V
Lead Channel Setting Pacing Amplitude: 2 V
Lead Channel Setting Pacing Pulse Width: 0.5 ms
Lead Channel Setting Sensing Sensitivity: 4 mV
Pulse Gen Model: 2272
Pulse Gen Serial Number: 8069479

## 2022-05-23 NOTE — Progress Notes (Signed)
Melinda Gonzales Date of Birth: 01/09/1935   History of Present Illness: [name] is seen for follow up of atrial flutter and CHF  She was evaluated in 2011 with a Myoview stress test which was normal. She had an Echo at that time that showed moderate LVH with EF 65-70%. Mild MR. She does have a history of murmur. She is not a smoker. In early 2016 she presented with increased dyspnea and an Echo and cardiac MRI were done. These with both consistent with HOCM. LVOT gradient of 112 mm Hg at rest. She was started on low dose lasix and Toprol XL with good response initially with improved dyspnea.  Seen last year with symptoms of increased dyspnea. No increased edema or orthopnea but more dyspnea with walking or lifting. No chest pain. No dizziness or palpitations. Energy level has decreased. Repeat Echo showed severe LVOT gradient and MR.  She underwent cardiac cath with findings of LVOT gradient and MR. No significant CAD. Pulmonary HTN. She was referred to the [facility] clinic and underwent surgery on 11/05/15. Prior to surgery she developed Afib and had a DCCV. Surgery  included a septal myectomy, MVR with a #29 mm [employer]. Jude  Biocor valve, left sided Cryo MAZE, and ligation of the left atrial appendage. Her post op course was remarkable for paroxysmal atrial fibrillation and she was started on amiodarone and Xarelto.  Ecg showed a LBBB. Repeat Echo on 11/11/15 showed normal LV function with EF 60%. Mean MV gradient 8 mm Hg. Trace MR. Mild TR. There was a fixed LVOT gradient of 32 mm Hg peak and mean of 16 mm Hg. Repeat Echo 02/03/16 showed no LVOT gradient and normal MV prosthesis function. There was some concern that amiodarone caused her to have an optic nerve problem with vision loss - this was discontinued.   She was seen in September 2020 with symptoms of persistent tachycardia and SOB. HR in the 120s. Echo was obtained and showed significant decline in LV function with EF 25-30%. After review  of Ecg tracings and reviewing with EP it appeared she was in Atrial flutter. BNP was 467. TSH was mildly elevated 6.0. She was started on Coreg. Lasix was increased she was on Xarelto. She underwent  DCCV on 10/23/18. Subsequent Echo in December showed EF had returned to normal.   When seen in February she was back in Atrial flutter- atypical. Once again this was associated with significant drop in EF to 30-35%. Mitral prosthesis was functioning well. Mild to moderate AS. She underwent successful DCCV on 03/29/21. Was seen by Dr Ladona Ridgel afterwards on 04/01/21 and was in NSR. He felt there were limited options for AAD therapy given intolerance to amiodarone. Did not feel she was a good candidate for ablation at her age and comorbidities. Consideration of AV node ablation and BiV pacer if flutter recurs or if EF does not recover.   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 Last pader check in October was functioning well with Afib burden of 16%. Repeat Echo in Jan showed normalization of EF with no outflow gradient. MV prosthesis functioning normally. Moderate pulmonary HTN.   On follow up today she is seen with her husband. She notes that she feels well. Still struggling with her weight. No increase in edema. No chest pain. Only palpitation when she is aggravated. She has eliminated sodas and is eating less sweets but does drink a lot of wine.  Current Outpatient Medications on File Prior to Visit  Medication Sig Dispense Refill   acetaminophen (TYLENOL) 325 MG tablet Take 1-2 tablets (325-650 mg total) by mouth every 4 (four) hours as needed for mild pain.     Apoaequorin (PREVAGEN) 10 MG CAPS Take 10 mg by mouth daily.     carvedilol (COREG) 6.25 MG tablet TAKE 1 TABLET BY MOUTH TWICE A DAY 60 tablet 6   empagliflozin (JARDIANCE) 10 MG TABS tablet TAKE 1 TABLET (10 MG TOTAL) BY MOUTH DAILY BEFORE BREAKFAST. 90 tablet 3   ENTRESTO 24-26 MG TAKE 1 TABLET BY MOUTH  TWICE A DAY 60 tablet 10   furosemide (LASIX) 40 MG tablet TAKE 1 TABLET BY MOUTH DAILY 90 tablet 3   latanoprost (XALATAN) 0.005 % ophthalmic solution Place 1 drop into both eyes at bedtime.     Menthol-Methyl Salicylate (SALONPAS PAIN RELIEF PATCH) PTCH Apply 1 packet topically daily as needed (pain).     Multiple Vitamins-Minerals (PRESERVISION AREDS 2) CAPS Take 1 capsule by mouth 2 (two) times daily.     potassium chloride SA (KLOR-CON M) 20 MEQ tablet TAKE 1 TABLET BY MOUTH DAILY 30 tablet 6   rosuvastatin (CRESTOR) 10 MG tablet TAKE 1 TABLET BY MOUTH DAILY 30 tablet 6   XARELTO 15 MG TABS tablet TAKE 1 TABLET BY MOUTH DAILY WITH SUPPER 30 tablet 5   No current facility-administered medications on file prior to visit.    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   SOB (shortness of breath)     Past Surgical History:  Procedure Laterality Date   AV NODE ABLATION N/A 05/10/2021   Procedure: AV NODE ABLATION;  Surgeon: Marinus Maw, MD;  Location: [facility] INVASIVE CV LAB;  Service: Cardiovascular;  Laterality: N/A;   CARDIAC CATHETERIZATION N/A 07/06/2015   Procedure: Right/Left Heart Cath and Coronary Angiography;  Surgeon: Darwyn Ponzo M [place], MD;  Location: [facility] INVASIVE CV LAB;  Service: Cardiovascular;  Laterality: N/A;   CARDIOVERSION N/A 10/23/2018   Procedure: CARDIOVERSION;  Surgeon: Little Ishikawa, MD;  Location: [facility] ENDOSCOPY;  Service: Endoscopy;  Laterality: N/A;   CARDIOVERSION N/A 03/29/2021   Procedure: CARDIOVERSION;  Surgeon: Thurmon Fair, MD;  Location: [facility]  ENDOSCOPY;  Service: Cardiovascular;  Laterality: N/A;   LEAD REVISION/REPAIR N/A 06/24/2021   Procedure: LEAD REVISION/REPAIR;  Surgeon: Marinus Maw, MD;  Location: [facility] INVASIVE CV LAB;  Service: Cardiovascular;  Laterality: N/A;   MITRAL VALVE REPLACEMENT N/A 11/15/2015   ligation of left atrial appendage, maze procedure at [facility] IMPLANT N/A 05/10/2021   Procedure: PACEMAKER IMPLANT;  Surgeon: Marinus Maw, MD;  Location: [facility] INVASIVE CV LAB;  Service: Cardiovascular;  Laterality: N/A;    Social History   Tobacco Use  Smoking Status Former   Types: Cigarettes   Quit date: 05/26/1965   Years since quitting: 57.0  Smokeless Tobacco Never    Social History   Substance and Sexual Activity  Alcohol Use Yes   Alcohol/week: 1.0 standard drink of alcohol  Types: 1 Standard drinks or equivalent per week   Comment: socially    Family History  Problem Relation Age of Onset   Heart failure Mother 54   Heart attack Father 2    Review of Systems: As noted in HPI.   All other systems were reviewed and are negative.  Physical Exam: BP (!) 108/56   Pulse 63   Ht 5\' 2"  (1.575 m)   Wt 191 lb 3.2 oz (86.7 kg)   SpO2 100%   BMI 34.97 kg/m   Body mass index is 34.97 kg/m. GENERAL:  Well appearing obese WF in NAD HEENT:  PERRL, EOMI, sclera are clear. Oropharynx is clear. NECK:  No jugular venous distention, carotid upstroke brisk and symmetric, no bruits, no thyromegaly or adenopathy LUNGS:  Clear to auscultation bilaterally CHEST:  Unremarkable HEART:  RRR- tachy.   PMI not displaced or sustained,S1 and S2 within normal limits, no S3, no S4: no clicks, no rubs, gr 1/6 systolic murmur at the apex>> RUSB ABD:  Soft, nontender. BS +, no masses or bruits. No hepatomegaly, no splenomegaly EXT:  2 + pulses throughout, tr edema, no cyanosis no clubbing SKIN:  Warm and dry.  No rashes NEURO:  Alert and oriented x 3. Cranial nerves II through XII  intact. PSYCH:  Cognitively intact    LABORATORY DATA:   Lab Results  Component Value Date   WBC 8.8 06/23/2021   HGB 13.8 06/23/2021   HCT 42.1 06/23/2021   PLT 236 06/23/2021   GLUCOSE 112 (H) 06/23/2021   CHOL 145 03/22/2021   TRIG 172 (H) 03/22/2021   HDL 46 03/22/2021   LDLCALC 70 03/22/2021   ALT 23 03/22/2021   AST 19 03/22/2021   NA 144 06/23/2021   K 4.7 06/23/2021   CL 108 (H) 06/23/2021   CREATININE 1.52 (H) 06/23/2021   BUN 29 (H) 06/23/2021   CO2 16 (L) 06/23/2021   TSH 7.730 (H) 03/22/2021   INR 1.03 07/06/2015   Labs dated 03/11/16: creatinine 1.6 Dated 07/27/16: cholesterol 192, triglycerides 165, HDL 52, LDL 107. Creatinine 1.27. Potassium and ALT normal.  Dated 02/07/17: BUN 27, creatinine 1.25. Glucose 113. Other chemistries normal. Cholesterol 199, triglycerides 212, HDL 44, LDL 113.  Dated 02/08/18: cholesterol 161, triglycerides 209, HDL 44, LDL 75. LFTs normal. Dated 10/13/19: A1c 6.7%. triglycerides 269, HDL 39. Unable to see other results in [facility] Dated 01/13/20: BUN 33, creatinine 1.76. glucose 137. Potassium 5.1. sodium 144. A1c 6.4% Dated 12/03/20: cholesterol 151, triglycerides 185, HDL 42, LDL 78. A1c 6.5%. BUN 37, creatinine 1.67. otherwise CMET normal. Dated 12/08/21: cholesterol 143, triglycerides 279, HDL 37, LDL 61, A1c 6.6%. creatinine 1.72. otherwise CMET and CBC normal.  Ecg done today. AV sequential pacing rate 63. I have personally reviewed and interpreted this study.    Echo 02/03/16: Study Conclusions   - Left ventricle: The cavity size was normal. There was moderate   concentric hypertrophy. Systolic function was normal. The   estimated ejection fraction was in the range of 60% to 65%. Wall   motion was normal; there were no regional wall motion   abnormalities. The study is not technically sufficient to allow   evaluation of LV diastolic function due to the presence of a   bioprosthetic mitral valve. - Aortic valve: Valve mobility  was mildly restricted. There was   mild stenosis. There was mild regurgitation. - Mitral valve: Mildly calcified annulus. A 29mm [employer]. Jude biocor   bioprosthesis was present and  functioning well. - Left atrium: The atrium was severely dilated. - Right ventricle: The cavity size was normal. Wall thickness was   normal. Systolic function was normal. - Tricuspid valve: There was mild regurgitation. - Pulmonary arteries: Systolic pressure was mildly increased. PA   peak pressure: 42 mm Hg (S).   Impressions:   - Since the last echo 06/15/2015, a bioprosthetic mitral valve is   present and functioning well. Septal wall thickness is now 1.5 cm   compared with 1.8 cm pre-operatively.  Echo 04/19/17: Study Conclusions   - Valve surgery. Mitral valve replacement with a [employer]   porcine valve. 29mm Biocor bioprosthetic valve. - Left ventricle: The cavity size was normal. Wall thickness was   increased in a pattern of mild LVH. Systolic function was normal.   The estimated ejection fraction was in the range of 55% to 60%.   Wall motion was normal; there were no regional wall motion   abnormalities. Doppler parameters are consistent with a   reversible restrictive pattern, indicative of decreased left   ventricular diastolic compliance and/or increased left atrial   pressure (grade 3 diastolic dysfunction). - Aortic valve: There was trivial regurgitation. Mean gradient (S):   11 mm Hg. Peak gradient (S): 19 mm Hg. - Mitral valve: A bioprosthesis was present. The prosthesis had a   normal range of motion. Mean gradient (D): 5 mm Hg. - Left atrium: The atrium was mildly dilated. - Pulmonary arteries: Systolic pressure was moderately increased.   PA peak pressure: 56 mm Hg (S).  Echocardiogram 10/15/2018: 1. The left ventricle has severely reduced systolic function, with an ejection fraction of 25-30%. The cavity size was normal. Left ventricular diastolic function could not be  evaluated. Elevated mean left atrial pressure Left ventricular diffuse  hypokinesis.  2. The right ventricle has moderately reduced systolic function. The cavity was normal. Right ventricular systolic pressure is mildly elevated with an estimated pressure of 41.8 mmHg.  3. Left atrial size was moderately dilated.  4. The tricuspid valve is grossly normal. Tricuspid valve regurgitation is moderate.  5. The aortic valve has an indeterminate number of cusps. Mild calcification of the aortic valve. Aortic valve regurgitation is trivial by color flow Doppler. No stenosis of the aortic valve.  6. The aorta is normal unless otherwise noted.  7. The inferior vena cava was dilated in size with >50% respiratory variability.  8. Septal dyssynergy and global hypokinesis; overall severe LV dysfunction; trace AI; s/p MVR with no MR; moderate LAE; moderate RV dysfunction; moderate TR and mild pulmonary hypertension.  Echo: 01/13/19: IMPRESSIONS     1. Left ventricular ejection fraction, by visual estimation, is 60 to  65%. The left ventricle has normal function. There is severely increased  left ventricular hypertrophy.   2. Left ventricular diastolic function could not be evaluated.   3. The left ventricle has no regional wall motion abnormalities.   4. Global right ventricle has normal systolic function.The right  ventricular size is normal. No increase in right ventricular wall  thickness.   5. Left atrial size was normal.   6. Right atrial size was normal.   7. The mitral valve has been repaired/replaced. No evidence of mitral  valve regurgitation.   8. The tricuspid valve is grossly normal. Tricuspid valve regurgitation  is trivial.   9. The aortic valve is abnormal. Aortic valve regurgitation is mild. Mild  aortic valve sclerosis without stenosis.  10. Pulmonic regurgitation is mild.  11. The  pulmonic valve was normal in structure. Pulmonic valve  regurgitation is mild.  12. The atrial septum  is grossly normal.    Echo 03/25/21: IMPRESSIONS     1. In afib/flutter with RVR during study. Left ventricular ejection  fraction, by estimation, is 30 to 35%. The left ventricle has moderately  decreased function. The left ventricle demonstrates global hypokinesis.  There is mild left ventricular  hypertrophy. Left ventricular diastolic parameters are indeterminate.   2. Right ventricular systolic function is moderately reduced. The right  ventricular size is normal. There is normal pulmonary artery systolic  pressure. The estimated right ventricular systolic pressure is 34.6 mmHg.   3. Left atrial size was moderately dilated.   4. There is a Biocor tissue heart valve present in the mitral position.  Procedure Date: 11/15/2015.      Trivial mitral valve regurgitation. Echo findings are consistent with  stenosis of the mitral prosthesis.      The mean mitral valve gradient is 10.0 mmHg at 123 bpm. MVA 1.3 cm^2  by continuity equation   5. The aortic valve was not well visualized. Aortic valve regurgitation  is not visualized. Mild to moderate aortic valve stenosis. Mild AS by  gradients (MG 12 mmHg, Vmax 2.4 m/s), moderate by AVA (1.0cm^2) and DI  (0.35). Low SV index (22 cc/m^2), likely   low flow low gradient moderate AS   6. The inferior vena cava is normal in size with greater than 50%  respiratory variability, suggesting right atrial pressure of 3 mmHg.   Echo 02/20/22: IMPRESSIONS     1. History of HOCM s/p septal myoectomy. Left ventricular ejection  fraction, by estimation, is 55 to 60%. The left ventricle has normal  function. The left ventricle has no regional wall motion abnormalities.  Left ventricular diastolic function could not   be evaluated.   2. Right ventricular systolic function is normal. The right ventricular  size is normal. There is normal pulmonary artery systolic pressure. The  estimated right ventricular systolic pressure is 26.4 mmHg.   3. Left  atrial size was mildly dilated.   4. The mitral valve has been repaired/replaced. No evidence of mitral  valve regurgitation. The mean mitral valve gradient is 4.0 mmHg with  average heart rate of 69 bpm. There is a 27 mm bioprosthetic valve present  in the mitral position. Procedure Date:   11/15/2015. Echo findings are consistent with normal structure and  function of the mitral valve prosthesis.   5. The aortic valve is tricuspid. Aortic valve regurgitation is trivial.  Aortic valve sclerosis/calcification is present, without any evidence of  aortic stenosis.   6. The inferior vena cava is normal in size with greater than 50%  respiratory variability, suggesting right atrial pressure of 3 mmHg.   Comparison(s): Changes from prior study are noted. The left ventricular  function has improved.     Assessment / Plan: 1. HOCM.  s/p septal myectomy on 11/05/15. Excellent result by follow up Echo. No significant residual LVOT gradient.   2. Severe MR s/p MVR with #29 St. Jude Biocor valve. Routine SBE prophylaxis.   3.  Chronic systolic CHF. LV dysfunction associated in the past with atrial flutter. EF 25-30%. Likely tachycardia mediated with new Atrial flutter with RVR. After DCCV EF  normalized based on Echo in December 2020.  Last February had recurrent Atrial flutter EF is back down to 30-35%. Has since had DDD pacer in place. This reports Afib burden of 16%. Once again  EF normalized with restoration of NSR.   4. Atrial flutter sustained.  post op open heart surgery. S/p left sided MAZE. Seen in September 2020 with  new onset Atrial flutter.  DCCV on 10/23/18. Had been on amiodarone post op heart surgery. Patient questions if this was the cause of her optic nerve problem. Has been on Xarelto long term and hasn't missed any doses. Now with recurrence in Feb  2023  S/p DCCV. Seen by Dr Ladona Ridgel- poor candidate for AAD therapy or ablation. Now s/p pacemaker implant. Rate controlled.  5.  Hypercholesterolemia. On low dose Crestor. No history of CAD. LDL 61.   6. CKD stage 3.   7. Obesity. Discussed weight loss strategy of limiting Etoh intake and restricting carbohydrate/sweet  intake.    Follow up in 6 months.

## 2022-05-24 DIAGNOSIS — H401123 Primary open-angle glaucoma, left eye, severe stage: Secondary | ICD-10-CM | POA: Diagnosis not present

## 2022-05-24 DIAGNOSIS — H4720 Unspecified optic atrophy: Secondary | ICD-10-CM | POA: Diagnosis not present

## 2022-05-24 DIAGNOSIS — H47011 Ischemic optic neuropathy, right eye: Secondary | ICD-10-CM | POA: Diagnosis not present

## 2022-05-24 DIAGNOSIS — H47012 Ischemic optic neuropathy, left eye: Secondary | ICD-10-CM | POA: Diagnosis not present

## 2022-05-29 ENCOUNTER — Encounter: Payer: Self-pay | Admitting: Cardiology

## 2022-05-29 ENCOUNTER — Ambulatory Visit: Payer: Medicare Other | Attending: Cardiology | Admitting: Cardiology

## 2022-05-29 VITALS — BP 108/56 | HR 63 | Ht 62.0 in | Wt 191.2 lb

## 2022-05-29 DIAGNOSIS — Z952 Presence of prosthetic heart valve: Secondary | ICD-10-CM | POA: Insufficient documentation

## 2022-05-29 DIAGNOSIS — I421 Obstructive hypertrophic cardiomyopathy: Secondary | ICD-10-CM | POA: Insufficient documentation

## 2022-05-29 DIAGNOSIS — I4892 Unspecified atrial flutter: Secondary | ICD-10-CM | POA: Diagnosis not present

## 2022-05-29 DIAGNOSIS — I442 Atrioventricular block, complete: Secondary | ICD-10-CM | POA: Insufficient documentation

## 2022-05-29 DIAGNOSIS — I5032 Chronic diastolic (congestive) heart failure: Secondary | ICD-10-CM | POA: Diagnosis not present

## 2022-05-29 NOTE — Patient Instructions (Signed)
Medication Instructions:  Continue same medications *If you need a refill on your cardiac medications before your next appointment, please call your pharmacy*   Lab Work: None ordered   Testing/Procedures: None ordered   Follow-Up: At [facility], you and your health needs are our priority.  As part of our continuing mission to provide you with exceptional heart care, we have created designated Provider Care Teams.  These Care Teams include your primary Cardiologist (physician) and Advanced Practice Providers (APPs -  Physician Assistants and Nurse Practitioners) who all work together to provide you with the care you need, when you need it.  We recommend signing up for the patient portal called "MyChart".  Sign up information is provided on this After Visit Summary.  MyChart is used to connect with patients for Virtual Visits (Telemedicine).  Patients are able to view lab/test results, encounter notes, upcoming appointments, etc.  Non-urgent messages can be sent to your provider as well.   To learn more about what you can do with MyChart, go to [email or web address].    Your next appointment:  6 months     Call in Mid May to schedule Oct appointment     Provider:  Dr.Jordan   

## 2022-06-07 DIAGNOSIS — N3001 Acute cystitis with hematuria: Secondary | ICD-10-CM | POA: Diagnosis not present

## 2022-06-07 DIAGNOSIS — R319 Hematuria, unspecified: Secondary | ICD-10-CM | POA: Diagnosis not present

## 2022-06-08 DIAGNOSIS — N3001 Acute cystitis with hematuria: Secondary | ICD-10-CM | POA: Diagnosis not present

## 2022-06-16 NOTE — Progress Notes (Signed)
Remote pacemaker transmission.   

## 2022-06-22 DIAGNOSIS — I700 Atherosclerosis of aorta: Secondary | ICD-10-CM | POA: Diagnosis not present

## 2022-06-22 DIAGNOSIS — E7800 Pure hypercholesterolemia, unspecified: Secondary | ICD-10-CM | POA: Diagnosis not present

## 2022-06-22 DIAGNOSIS — E1122 Type 2 diabetes mellitus with diabetic chronic kidney disease: Secondary | ICD-10-CM | POA: Diagnosis not present

## 2022-06-22 DIAGNOSIS — I421 Obstructive hypertrophic cardiomyopathy: Secondary | ICD-10-CM | POA: Diagnosis not present

## 2022-06-22 DIAGNOSIS — N1832 Chronic kidney disease, stage 3b: Secondary | ICD-10-CM | POA: Diagnosis not present

## 2022-06-22 DIAGNOSIS — Y712 Prosthetic and other implants, materials and accessory cardiovascular devices associated with adverse incidents: Secondary | ICD-10-CM | POA: Diagnosis not present

## 2022-06-22 DIAGNOSIS — R635 Abnormal weight gain: Secondary | ICD-10-CM | POA: Diagnosis not present

## 2022-06-22 DIAGNOSIS — I4891 Unspecified atrial fibrillation: Secondary | ICD-10-CM | POA: Diagnosis not present

## 2022-07-24 ENCOUNTER — Other Ambulatory Visit: Payer: Self-pay | Admitting: Cardiology

## 2022-08-08 ENCOUNTER — Ambulatory Visit (INDEPENDENT_AMBULATORY_CARE_PROVIDER_SITE_OTHER): Payer: Medicare Other

## 2022-08-08 DIAGNOSIS — I421 Obstructive hypertrophic cardiomyopathy: Secondary | ICD-10-CM

## 2022-08-09 LAB — CUP PACEART REMOTE DEVICE CHECK
Battery Remaining Longevity: 61 mo
Battery Remaining Percentage: 87 %
Battery Voltage: 2.98 V
Brady Statistic AP VP Percent: 97 %
Brady Statistic AP VS Percent: 1 %
Brady Statistic AS VP Percent: 3 %
Brady Statistic AS VS Percent: 1 %
Brady Statistic RA Percent Paced: 93 %
Brady Statistic RV Percent Paced: 99 %
Date Time Interrogation Session: 20240709054303
Implantable Lead Connection Status: 753985
Implantable Lead Connection Status: 753985
Implantable Lead Implant Date: 20230411
Implantable Lead Implant Date: 20230411
Implantable Lead Location: 753859
Implantable Lead Location: 753860
Implantable Pulse Generator Implant Date: 20230411
Lead Channel Impedance Value: 390 Ohm
Lead Channel Impedance Value: 840 Ohm
Lead Channel Pacing Threshold Amplitude: 1.75 V
Lead Channel Pacing Threshold Amplitude: 3.875 V
Lead Channel Pacing Threshold Pulse Width: 0.5 ms
Lead Channel Pacing Threshold Pulse Width: 0.5 ms
Lead Channel Sensing Intrinsic Amplitude: 0.5 mV
Lead Channel Sensing Intrinsic Amplitude: 5 mV
Lead Channel Setting Pacing Amplitude: 2 V
Lead Channel Setting Pacing Amplitude: 4.125
Lead Channel Setting Pacing Pulse Width: 0.5 ms
Lead Channel Setting Sensing Sensitivity: 4 mV
Pulse Gen Model: 2272
Pulse Gen Serial Number: 8069479

## 2022-08-21 ENCOUNTER — Other Ambulatory Visit: Payer: Self-pay | Admitting: Cardiology

## 2022-08-21 NOTE — Telephone Encounter (Signed)
Prescription refill request for Xarelto received.  Indication:afib Last office visit:4/24 Weight:86.7  kg Age:88 Scr:1.77  5/24 CrCl:31.23  ml/min  Prescription refilled

## 2022-08-31 NOTE — Progress Notes (Signed)
Remote pacemaker transmission.   

## 2022-09-06 DIAGNOSIS — E039 Hypothyroidism, unspecified: Secondary | ICD-10-CM | POA: Diagnosis not present

## 2022-09-06 DIAGNOSIS — E1122 Type 2 diabetes mellitus with diabetic chronic kidney disease: Secondary | ICD-10-CM | POA: Diagnosis not present

## 2022-09-06 DIAGNOSIS — R310 Gross hematuria: Secondary | ICD-10-CM | POA: Diagnosis not present

## 2022-09-06 DIAGNOSIS — R051 Acute cough: Secondary | ICD-10-CM | POA: Diagnosis not present

## 2022-09-26 DIAGNOSIS — R399 Unspecified symptoms and signs involving the genitourinary system: Secondary | ICD-10-CM | POA: Diagnosis not present

## 2022-10-11 DIAGNOSIS — I5022 Chronic systolic (congestive) heart failure: Secondary | ICD-10-CM | POA: Diagnosis not present

## 2022-10-11 DIAGNOSIS — E1122 Type 2 diabetes mellitus with diabetic chronic kidney disease: Secondary | ICD-10-CM | POA: Diagnosis not present

## 2022-10-11 DIAGNOSIS — N1832 Chronic kidney disease, stage 3b: Secondary | ICD-10-CM | POA: Diagnosis not present

## 2022-10-11 DIAGNOSIS — E669 Obesity, unspecified: Secondary | ICD-10-CM | POA: Diagnosis not present

## 2022-10-11 DIAGNOSIS — N184 Chronic kidney disease, stage 4 (severe): Secondary | ICD-10-CM | POA: Diagnosis not present

## 2022-10-11 DIAGNOSIS — R319 Hematuria, unspecified: Secondary | ICD-10-CM | POA: Diagnosis not present

## 2022-10-11 DIAGNOSIS — I4891 Unspecified atrial fibrillation: Secondary | ICD-10-CM | POA: Diagnosis not present

## 2022-10-11 DIAGNOSIS — E785 Hyperlipidemia, unspecified: Secondary | ICD-10-CM | POA: Diagnosis not present

## 2022-10-13 ENCOUNTER — Encounter: Payer: Self-pay | Admitting: Internal Medicine

## 2022-10-13 ENCOUNTER — Ambulatory Visit: Payer: Medicare Other | Attending: Internal Medicine | Admitting: Internal Medicine

## 2022-10-13 VITALS — BP 122/74 | HR 63 | Ht 62.0 in | Wt 189.2 lb

## 2022-10-13 DIAGNOSIS — I484 Atypical atrial flutter: Secondary | ICD-10-CM | POA: Diagnosis present

## 2022-10-13 DIAGNOSIS — N1832 Chronic kidney disease, stage 3b: Secondary | ICD-10-CM | POA: Diagnosis not present

## 2022-10-13 LAB — CUP PACEART INCLINIC DEVICE CHECK
Battery Remaining Longevity: 36 mo
Battery Voltage: 2.98 V
Brady Statistic RA Percent Paced: 92 %
Brady Statistic RV Percent Paced: 99.81 %
Date Time Interrogation Session: 20240913172354
Implantable Lead Connection Status: 753985
Implantable Lead Connection Status: 753985
Implantable Lead Implant Date: 20230411
Implantable Lead Implant Date: 20230411
Implantable Lead Location: 753859
Implantable Lead Location: 753860
Implantable Pulse Generator Implant Date: 20230411
Lead Channel Impedance Value: 400 Ohm
Lead Channel Impedance Value: 837.5 Ohm
Lead Channel Pacing Threshold Amplitude: 1.5 V
Lead Channel Pacing Threshold Amplitude: 1.5 V
Lead Channel Pacing Threshold Amplitude: 2.75 V
Lead Channel Pacing Threshold Amplitude: 2.75 V
Lead Channel Pacing Threshold Pulse Width: 0.5 ms
Lead Channel Pacing Threshold Pulse Width: 0.5 ms
Lead Channel Pacing Threshold Pulse Width: 0.8 ms
Lead Channel Pacing Threshold Pulse Width: 0.8 ms
Lead Channel Sensing Intrinsic Amplitude: 0.5 mV
Lead Channel Sensing Intrinsic Amplitude: 5 mV
Lead Channel Setting Pacing Amplitude: 2.5 V
Lead Channel Setting Pacing Amplitude: 4 V
Lead Channel Setting Pacing Pulse Width: 1 ms
Lead Channel Setting Sensing Sensitivity: 4 mV
Pulse Gen Model: 2272
Pulse Gen Serial Number: 8069479

## 2022-10-13 NOTE — Progress Notes (Signed)
HPI [name] returns today for followup. She is a pleasant 88 yo woman with uncontrolled atrial fib who underwent PPM and av node ablation and then had dislodgement of her RV lead which had pulled back into the SVC. 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 Since her revision she has done well. No chest pain or sob. No syncope. No palpitations.  Allergies  Allergen Reactions   Oxycodone Nausea And Vomiting    And the sweats   Statins     Myalgias tolerates crestor     Current Outpatient Medications  Medication Sig Dispense Refill   acetaminophen (TYLENOL) 325 MG tablet Take 1-2 tablets (325-650 mg total) by mouth every 4 (four) hours as needed for mild pain.     Apoaequorin (PREVAGEN) 10 MG CAPS Take 10 mg by mouth daily.     carvedilol (COREG) 6.25 MG tablet TAKE 1 TABLET BY MOUTH TWICE A DAY 60 tablet 6   empagliflozin (JARDIANCE) 10 MG TABS tablet TAKE 1 TABLET (10 MG TOTAL) BY MOUTH DAILY BEFORE BREAKFAST. 90 tablet 3   ENTRESTO 24-26 MG TAKE 1 TABLET BY MOUTH TWICE A DAY 60 tablet 10   furosemide (LASIX) 40 MG tablet TAKE 1 TABLET BY MOUTH DAILY 90 tablet 3   latanoprost (XALATAN) 0.005 % ophthalmic solution Place 1 drop into both eyes at bedtime.     Menthol-Methyl Salicylate (SALONPAS PAIN RELIEF PATCH) PTCH Apply 1 packet topically daily as needed (pain).     Multiple Vitamins-Minerals (PRESERVISION AREDS 2) CAPS Take 1 capsule by mouth 2 (two) times daily.     potassium chloride SA (KLOR-CON M) 20 MEQ tablet TAKE 1 TABLET BY MOUTH DAILY 30 tablet 6   rosuvastatin (CRESTOR) 10 MG tablet TAKE 1 TABLET BY MOUTH DAILY 30 tablet 6   XARELTO 15 MG TABS tablet TAKE 1 TABLET BY MOUTH DAILY WITH SUPPER 30 tablet 5   No current facility-administered medications for this visit.     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   Past Surgical History:  Procedure Laterality Date   AV NODE ABLATION N/A 05/10/2021   Procedure: AV NODE ABLATION;  Surgeon: Marinus Maw, MD;  Location: [facility] INVASIVE CV LAB;  Service: Cardiovascular;  Laterality: N/A;   CARDIAC CATHETERIZATION N/A 07/06/2015   Procedure: Right/Left Heart Cath and Coronary Angiography;  Surgeon: Peter M Swaziland, MD;  Location: [facility] INVASIVE CV LAB;  Service: Cardiovascular;  Laterality: N/A;   CARDIOVERSION N/A 10/23/2018   Procedure: CARDIOVERSION;  Surgeon: Little Ishikawa, MD;  Location: [facility] ENDOSCOPY;  Service: Endoscopy;  Laterality: N/A;   CARDIOVERSION N/A 03/29/2021   Procedure: CARDIOVERSION;  Surgeon: Thurmon Fair, MD;  Location: [facility] ENDOSCOPY;  Service: Cardiovascular;  Laterality: N/A;   LEAD REVISION/REPAIR N/A 06/24/2021   Procedure: LEAD REVISION/REPAIR;  Surgeon: Marinus Maw, MD;  Location: [facility] INVASIVE CV LAB;  Service: Cardiovascular;  Laterality: N/A;   MITRAL VALVE REPLACEMENT N/A 11/15/2015   ligation of left atrial appendage, maze procedure  at [facility]   PACEMAKER IMPLANT N/A 05/10/2021   Procedure: PACEMAKER IMPLANT;  Surgeon: Marinus Maw, MD;  Location: [facility] INVASIVE CV LAB;  Service: Cardiovascular;  Laterality: N/A;     Family History  Problem Relation Age of Onset   Heart failure Mother 58   Heart attack Father 79     Social History   Socioeconomic History   Marital status: Married    Spouse name: Not on file   Number of children: 2   Years of education: Not on file   Highest education level: Not on file  Occupational History    Employer: RETIRED    Comment: retired  Tobacco Use   Smoking status: Former     Current packs/day: 0.00    Types: Cigarettes    Quit date: 05/26/1965    Years since quitting: 57.4   Smokeless tobacco: Never  Vaping Use   Vaping status: Never Used  Substance and Sexual Activity   Alcohol use: Yes    Alcohol/week: 1.0 standard drink of alcohol    Types: 1 Standard drinks or equivalent per week    Comment: socially   Drug use: No   Sexual activity: Yes  Other Topics Concern   Not on file  Social History Narrative   Not on file   Social Determinants of Health   Financial Resource Strain: Not on file  Food Insecurity: Not on file  Transportation Needs: Not on file  Physical Activity: Not on file  Stress: Not on file  Social Connections: Not on file  Intimate Partner Violence: Not on file     BP 122/74   Pulse 63   Ht 5\' 2"  (1.575 m)   Wt 189 lb 3.2 oz (85.8 kg)   SpO2 94%   BMI 34.61 kg/m   Physical Exam:  Well appearing NAD HEENT: Unremarkable Neck:  No JVD, no thyromegally Lymphatics:  No adenopathy Back:  No CVA tenderness Lungs:  Clear HEART:  Regular rate rhythm, no murmurs, no rubs, no clicks Abd:  soft, positive bowel sounds, no organomegally, no rebound, no guarding Ext:  2 plus pulses, no edema, no cyanosis, no clubbing Skin:  No rashes no nodules Neuro:  CN II through XII intact, motor grossly intact   DEVICE  Normal device function.  See PaceArt for details.   Assess/Plan:  Persistent atrial fib - she is maintaining NSR.  CHB - she is asymptomatic s/p PPM insertion. Obesity - she is encouraged to increase her physical activity. HTN - her bp is controlled today. Continue current meds.   Melinda Gonzales Melinda Eckstein,MD

## 2022-10-13 NOTE — Patient Instructions (Signed)
Medication Instructions:  Your physician recommends that you continue on your current medications as directed. Please refer to the Current Medication list given to you today.  *If you need a refill on your cardiac medications before your next appointment, please call your pharmacy*  Lab Work: None ordered.  If you have labs (blood work) drawn today and your tests are completely normal, you will receive your results only by: MyChart Message (if you have MyChart) OR A paper copy in the mail If you have any lab test that is abnormal or we need to change your treatment, we will call you to review the results.  Testing/Procedures: None ordered.  Follow-Up: At [facility], you and your health needs are our priority.  As part of our continuing mission to provide you with exceptional heart care, we have created designated Provider Care Teams.  These Care Teams include your primary Cardiologist (physician) and Advanced Practice Providers (APPs -  Physician Assistants and Nurse Practitioners) who all work together to provide you with the care you need, when you need it.   Your next appointment:   1 year(s)  The format for your next appointment:   In Person  Provider:   Lewayne Bunting, MD{or one of the following Advanced Practice Providers on your designated Care Team:   Francis Dowse, [place] Casimiro Needle "Mardelle Matte" [place], [place] Earnest Rosier, NP   Important Information About Sugar

## 2022-10-18 DIAGNOSIS — Z23 Encounter for immunization: Secondary | ICD-10-CM | POA: Diagnosis not present

## 2022-11-01 DIAGNOSIS — N1832 Chronic kidney disease, stage 3b: Secondary | ICD-10-CM | POA: Diagnosis not present

## 2022-11-07 ENCOUNTER — Ambulatory Visit (INDEPENDENT_AMBULATORY_CARE_PROVIDER_SITE_OTHER): Payer: Medicare Other

## 2022-11-07 DIAGNOSIS — I484 Atypical atrial flutter: Secondary | ICD-10-CM

## 2022-11-08 LAB — CUP PACEART REMOTE DEVICE CHECK
Battery Remaining Longevity: 37 mo
Battery Remaining Percentage: 81 %
Battery Voltage: 2.98 V
Brady Statistic AP VP Percent: 99 %
Brady Statistic AP VS Percent: 1 %
Brady Statistic AS VP Percent: 1.2 %
Brady Statistic AS VS Percent: 1 %
Brady Statistic RA Percent Paced: 99 %
Brady Statistic RV Percent Paced: 99 %
Date Time Interrogation Session: 20241008020015
Implantable Lead Connection Status: 753985
Implantable Lead Connection Status: 753985
Implantable Lead Implant Date: 20230411
Implantable Lead Implant Date: 20230411
Implantable Lead Location: 753859
Implantable Lead Location: 753860
Implantable Pulse Generator Implant Date: 20230411
Lead Channel Impedance Value: 360 Ohm
Lead Channel Impedance Value: 800 Ohm
Lead Channel Pacing Threshold Amplitude: 1.5 V
Lead Channel Pacing Threshold Amplitude: 2.75 V
Lead Channel Pacing Threshold Pulse Width: 0.5 ms
Lead Channel Pacing Threshold Pulse Width: 0.8 ms
Lead Channel Sensing Intrinsic Amplitude: 0.5 mV
Lead Channel Sensing Intrinsic Amplitude: 5 mV
Lead Channel Setting Pacing Amplitude: 2.5 V
Lead Channel Setting Pacing Amplitude: 4 V
Lead Channel Setting Pacing Pulse Width: 1 ms
Lead Channel Setting Sensing Sensitivity: 4 mV
Pulse Gen Model: 2272
Pulse Gen Serial Number: 8069479

## 2022-11-09 DIAGNOSIS — H47011 Ischemic optic neuropathy, right eye: Secondary | ICD-10-CM | POA: Diagnosis not present

## 2022-11-09 DIAGNOSIS — H4720 Unspecified optic atrophy: Secondary | ICD-10-CM | POA: Diagnosis not present

## 2022-11-09 DIAGNOSIS — Z961 Presence of intraocular lens: Secondary | ICD-10-CM | POA: Diagnosis not present

## 2022-11-09 DIAGNOSIS — H353112 Nonexudative age-related macular degeneration, right eye, intermediate dry stage: Secondary | ICD-10-CM | POA: Diagnosis not present

## 2022-11-09 DIAGNOSIS — H47012 Ischemic optic neuropathy, left eye: Secondary | ICD-10-CM | POA: Diagnosis not present

## 2022-11-09 DIAGNOSIS — H401123 Primary open-angle glaucoma, left eye, severe stage: Secondary | ICD-10-CM | POA: Diagnosis not present

## 2022-11-09 DIAGNOSIS — H2511 Age-related nuclear cataract, right eye: Secondary | ICD-10-CM | POA: Diagnosis not present

## 2022-11-09 DIAGNOSIS — H353221 Exudative age-related macular degeneration, left eye, with active choroidal neovascularization: Secondary | ICD-10-CM | POA: Diagnosis not present

## 2022-11-13 ENCOUNTER — Encounter (INDEPENDENT_AMBULATORY_CARE_PROVIDER_SITE_OTHER): Payer: Medicare Other | Admitting: Ophthalmology

## 2022-11-13 DIAGNOSIS — H353231 Exudative age-related macular degeneration, bilateral, with active choroidal neovascularization: Secondary | ICD-10-CM | POA: Diagnosis not present

## 2022-11-13 DIAGNOSIS — H43813 Vitreous degeneration, bilateral: Secondary | ICD-10-CM | POA: Diagnosis not present

## 2022-11-26 NOTE — Progress Notes (Unsigned)
Cardiology Office Note:  .   Date:  11/27/2022  ID:  Melinda Gonzales, DOB 31-Dec-1934, MRN [id number] PCP: Maurice Small, MD (Inactive)  [facility] Providers Cardiologist:  Peter Swaziland MD }   History of Present Illness: .   Melinda Gonzales is a 88 y.o. female with history of chronic atrial flutter, HOCM, cardiac catheterization 2023 revealed no significant CAD, but found to have significant LVOT gradient and MR and was referred to [facility] clinic and underwent surgery on 10/6/207 for a septal myomectomy and [facility] Biocor valve #29 with left-sided cryo maze and ligation of left atrial appendage.    Repeat echo in 2024 revealed no LVOT gradient and normal mitral valve prosthetic function.  Of note she did undergo revision of her pacemaker in May 2023 due to Twiddler's syndrome now has a DDD pacemaker in place followed by Dr. Sharrell Ku who reports that she is a poor candidate for AAD therapy or ablation, continues on Xarelto.   She comes today with her husband who are both longtime patients of Dr. Swaziland.  She offers no new cardiac complaints.  Her husband expresses that she has chronic dyspnea on exertion which has been going on for years.  The patient uses a cane for ambulation.  She denies any chest pain, dizziness, profound fatigue, or significant edema.  Pacemaker was checked remotely per protocol.  ROS: As above otherwise negative.  Studies Reviewed: .       Echo 02/20/22:  1. History of HOCM s/p septal myoectomy. Left ventricular ejection  fraction, by estimation, is 55 to 60%. The left ventricle has normal  function. The left ventricle has no regional wall motion abnormalities.  Left ventricular diastolic function could not   be evaluated.   2. Right ventricular systolic function is normal. The right ventricular  size is normal. There is normal pulmonary artery systolic pressure. The  estimated right ventricular systolic pressure is 26.4 mmHg.   3. Left atrial  size was mildly dilated.   4. The mitral valve has been repaired/replaced. No evidence of mitral  valve regurgitation. The mean mitral valve gradient is 4.0 mmHg with  average heart rate of 69 bpm. There is a 27 mm bioprosthetic valve present  in the mitral position. Procedure Date:   11/15/2015. Echo findings are consistent with normal structure and  function of the mitral valve prosthesis.   5. The aortic valve is tricuspid. Aortic valve regurgitation is trivial.  Aortic valve sclerosis/calcification is present, without any evidence of  aortic stenosis.   6. The inferior vena cava is normal in size with greater than 50%  respiratory variability, suggesting right atrial pressure of 3 mmHg.   Physical Exam:   VS:  BP 116/76   Pulse 62   Ht 5\' 2"  (1.575 m)   Wt 191 lb 12.8 oz (87 kg)   SpO2 97%   BMI 35.08 kg/m    Wt Readings from Last 3 Encounters:  11/27/22 191 lb 12.8 oz (87 kg)  10/13/22 189 lb 3.2 oz (85.8 kg)  05/29/22 191 lb 3.2 oz (86.7 kg)    GEN: Well nourished, well developed in no acute distress NECK: No JVD; No carotid bruits CARDIAC: 2/6 system murmur, heard at the left sternal border, RRR,  rubs, gallops RESPIRATORY:  Clear to auscultation without rales, high-pitched expiratory wheeze,  ABDOMEN: Soft, non-tender, non-distended EXTREMITIES:  mild dependent edema; No deformity   ASSESSMENT AND PLAN: .    History of HOCM: Status post  septum myomectomy to [facility] clinic.  She is doing well denies any chest discomfort.  She continues to have some dyspnea on exertion although she is not very active.  Normal LVEF per echocardiogram in January 2024.  Continue current medication regimen as she is tolerating this well.   2.  Chronic atrial flutter: Remains on Xarelto.  Asymptomatic currently.  Was found to be a poor candidate for ED therapy or ablation.  3.  Pacemaker in situ: Followed by Dr. Elberta Fortis with remote checks per protocol.  4.  Chronic shortness of breath: I  offered PFTs as I am auscultating expiratory wheezes.  The patient would like to wait on having any further testing at this time.  Can be reevaluated on follow-up by primary care and/or Dr. Swaziland on next appointment.  She is not on any inhalers currently.  4.  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  Systolic murmur is auscultated.  She denies any symptoms of chest pain.  She does have chronic shortness of breath.  No new medications or testing at this time.  Would repeat echocardiogram in January 2025.  SBE prophylaxis is required for dental procedures.       Patient and her husband would only like to be seen by Dr. Swaziland on follow-up and not NP or PA.  Signed, Bettey Mare. Liborio Nixon, ANP, AACC

## 2022-11-27 ENCOUNTER — Encounter: Payer: Self-pay | Admitting: Adult Health

## 2022-11-27 ENCOUNTER — Ambulatory Visit: Payer: Medicare Other | Attending: Adult Health | Admitting: Adult Health

## 2022-11-27 VITALS — BP 116/76 | HR 62 | Ht 62.0 in | Wt 191.8 lb

## 2022-11-27 DIAGNOSIS — I4892 Unspecified atrial flutter: Secondary | ICD-10-CM | POA: Insufficient documentation

## 2022-11-27 DIAGNOSIS — Z9889 Other specified postprocedural states: Secondary | ICD-10-CM | POA: Insufficient documentation

## 2022-11-27 DIAGNOSIS — I421 Obstructive hypertrophic cardiomyopathy: Secondary | ICD-10-CM | POA: Insufficient documentation

## 2022-11-27 DIAGNOSIS — Z952 Presence of prosthetic heart valve: Secondary | ICD-10-CM | POA: Insufficient documentation

## 2022-11-27 DIAGNOSIS — I10 Essential (primary) hypertension: Secondary | ICD-10-CM | POA: Insufficient documentation

## 2022-11-27 NOTE — Patient Instructions (Signed)
Medication Instructions:  No Changes *If you need a refill on your cardiac medications before your next appointment, please call your pharmacy*   Lab Work: No Labs If you have labs (blood work) drawn today and your tests are completely normal, you will receive your results only by: MyChart Message (if you have MyChart) OR A paper copy in the mail If you have any lab test that is abnormal or we need to change your treatment, we will call you to review the results.   Testing/Procedures: No Testing   Follow-Up: At [facility], you and your health needs are our priority.  As part of our continuing mission to provide you with exceptional heart care, we have created designated Provider Care Teams.  These Care Teams include your primary Cardiologist (physician) and Advanced Practice Providers (APPs -  Physician Assistants and Nurse Practitioners) who all work together to provide you with the care you need, when you need it.  We recommend signing up for the patient portal called "MyChart".  Sign up information is provided on this After Visit Summary.  MyChart is used to connect with patients for Virtual Visits (Telemedicine).  Patients are able to view lab/test results, encounter notes, upcoming appointments, etc.  Non-urgent messages can be sent to your provider as well.   To learn more about what you can do with MyChart, go to [email or web address].    Your next appointment:   6 month(s) Doctor Only  Provider:   Peter Swaziland, MD

## 2022-11-27 NOTE — Progress Notes (Signed)
Remote pacemaker transmission.   

## 2022-12-11 ENCOUNTER — Encounter (INDEPENDENT_AMBULATORY_CARE_PROVIDER_SITE_OTHER): Payer: Medicare Other | Admitting: Ophthalmology

## 2022-12-11 DIAGNOSIS — H43813 Vitreous degeneration, bilateral: Secondary | ICD-10-CM

## 2022-12-11 DIAGNOSIS — H47211 Primary optic atrophy, right eye: Secondary | ICD-10-CM

## 2022-12-11 DIAGNOSIS — H353231 Exudative age-related macular degeneration, bilateral, with active choroidal neovascularization: Secondary | ICD-10-CM

## 2022-12-11 DIAGNOSIS — H2511 Age-related nuclear cataract, right eye: Secondary | ICD-10-CM

## 2022-12-13 ENCOUNTER — Other Ambulatory Visit: Payer: Self-pay | Admitting: Internal Medicine

## 2022-12-13 DIAGNOSIS — E1122 Type 2 diabetes mellitus with diabetic chronic kidney disease: Secondary | ICD-10-CM | POA: Diagnosis not present

## 2022-12-13 DIAGNOSIS — Z0000 Encounter for general adult medical examination without abnormal findings: Secondary | ICD-10-CM | POA: Diagnosis not present

## 2022-12-13 DIAGNOSIS — I421 Obstructive hypertrophic cardiomyopathy: Secondary | ICD-10-CM | POA: Diagnosis not present

## 2022-12-13 DIAGNOSIS — I420 Dilated cardiomyopathy: Secondary | ICD-10-CM | POA: Diagnosis not present

## 2022-12-13 DIAGNOSIS — Y712 Prosthetic and other implants, materials and accessory cardiovascular devices associated with adverse incidents: Secondary | ICD-10-CM | POA: Diagnosis not present

## 2022-12-13 DIAGNOSIS — I4891 Unspecified atrial fibrillation: Secondary | ICD-10-CM | POA: Diagnosis not present

## 2022-12-13 DIAGNOSIS — E7800 Pure hypercholesterolemia, unspecified: Secondary | ICD-10-CM | POA: Diagnosis not present

## 2022-12-13 DIAGNOSIS — E039 Hypothyroidism, unspecified: Secondary | ICD-10-CM | POA: Diagnosis not present

## 2022-12-13 DIAGNOSIS — I700 Atherosclerosis of aorta: Secondary | ICD-10-CM | POA: Diagnosis not present

## 2022-12-13 DIAGNOSIS — C801 Malignant (primary) neoplasm, unspecified: Secondary | ICD-10-CM | POA: Diagnosis not present

## 2022-12-13 DIAGNOSIS — N1832 Chronic kidney disease, stage 3b: Secondary | ICD-10-CM | POA: Diagnosis not present

## 2022-12-13 DIAGNOSIS — Z66 Do not resuscitate: Secondary | ICD-10-CM | POA: Diagnosis not present

## 2022-12-13 DIAGNOSIS — H6123 Impacted cerumen, bilateral: Secondary | ICD-10-CM | POA: Diagnosis not present

## 2022-12-13 DIAGNOSIS — N950 Postmenopausal bleeding: Secondary | ICD-10-CM

## 2022-12-19 ENCOUNTER — Ambulatory Visit
Admission: RE | Admit: 2022-12-19 | Discharge: 2022-12-19 | Disposition: A | Discharge status: Home / Self Care | Payer: Medicare Other | Source: Ambulatory Visit | Attending: Internal Medicine

## 2022-12-19 DIAGNOSIS — N950 Postmenopausal bleeding: Secondary | ICD-10-CM | POA: Diagnosis not present

## 2023-01-08 ENCOUNTER — Encounter (INDEPENDENT_AMBULATORY_CARE_PROVIDER_SITE_OTHER): Payer: Medicare Other | Admitting: Ophthalmology

## 2023-01-08 DIAGNOSIS — H353231 Exudative age-related macular degeneration, bilateral, with active choroidal neovascularization: Secondary | ICD-10-CM

## 2023-01-08 DIAGNOSIS — H43813 Vitreous degeneration, bilateral: Secondary | ICD-10-CM

## 2023-01-08 DIAGNOSIS — H47211 Primary optic atrophy, right eye: Secondary | ICD-10-CM | POA: Diagnosis not present

## 2023-02-05 ENCOUNTER — Encounter (INDEPENDENT_AMBULATORY_CARE_PROVIDER_SITE_OTHER): Payer: Medicare Other | Admitting: Ophthalmology

## 2023-02-06 ENCOUNTER — Ambulatory Visit (INDEPENDENT_AMBULATORY_CARE_PROVIDER_SITE_OTHER): Payer: Medicare Other

## 2023-02-06 DIAGNOSIS — I442 Atrioventricular block, complete: Secondary | ICD-10-CM | POA: Diagnosis not present

## 2023-02-07 LAB — CUP PACEART REMOTE DEVICE CHECK
Battery Remaining Longevity: 35 mo
Battery Remaining Percentage: 75 %
Battery Voltage: 2.96 V
Brady Statistic AP VP Percent: 99 %
Brady Statistic AP VS Percent: 1 %
Brady Statistic AS VP Percent: 1 %
Brady Statistic AS VS Percent: 1 %
Brady Statistic RA Percent Paced: 99 %
Brady Statistic RV Percent Paced: 99 %
Date Time Interrogation Session: 20250107041348
Implantable Lead Connection Status: 753985
Implantable Lead Connection Status: 753985
Implantable Lead Implant Date: 20230411
Implantable Lead Implant Date: 20230411
Implantable Lead Location: 753859
Implantable Lead Location: 753860
Implantable Pulse Generator Implant Date: 20230411
Lead Channel Impedance Value: 380 Ohm
Lead Channel Impedance Value: 810 Ohm
Lead Channel Pacing Threshold Amplitude: 1.5 V
Lead Channel Pacing Threshold Amplitude: 2.75 V
Lead Channel Pacing Threshold Pulse Width: 0.5 ms
Lead Channel Pacing Threshold Pulse Width: 0.8 ms
Lead Channel Sensing Intrinsic Amplitude: 0.5 mV
Lead Channel Sensing Intrinsic Amplitude: 5 mV
Lead Channel Setting Pacing Amplitude: 2.5 V
Lead Channel Setting Pacing Amplitude: 4 V
Lead Channel Setting Pacing Pulse Width: 1 ms
Lead Channel Setting Sensing Sensitivity: 4 mV
Pulse Gen Model: 2272
Pulse Gen Serial Number: 8069479

## 2023-02-20 ENCOUNTER — Other Ambulatory Visit: Payer: Self-pay | Admitting: Cardiology

## 2023-02-21 ENCOUNTER — Telehealth: Payer: Self-pay | Admitting: Cardiology

## 2023-02-21 MED ORDER — CARVEDILOL 6.25 MG PO TABS: 6.2500 mg | ORAL_TABLET | Freq: Two times a day (BID) | ORAL | 3 refills | Status: DC

## 2023-02-21 MED ORDER — FUROSEMIDE 40 MG PO TABS: 40.0000 mg | ORAL_TABLET | Freq: Every day | ORAL | 3 refills | Status: DC

## 2023-02-21 MED ORDER — ROSUVASTATIN CALCIUM 10 MG PO TABS: 10.0000 mg | ORAL_TABLET | Freq: Every day | ORAL | 3 refills | Status: DC

## 2023-02-21 MED ORDER — EMPAGLIFLOZIN 10 MG PO TABS: 10.0000 mg | ORAL_TABLET | Freq: Every day | ORAL | 3 refills | Status: DC

## 2023-02-21 NOTE — Telephone Encounter (Signed)
*  STAT* If patient is at the pharmacy, call can be transferred to refill team.   1. Which medications need to be refilled? (please list name of each medication and dose if known) carvedilol (COREG) 6.25 MG tablet  furosemide (LASIX) 40 MG tablet  empagliflozin (JARDIANCE) 10 MG TABS tablet  rosuvastatin (CRESTOR) 10 MG tablet  2. Which pharmacy/location (including street and city if local pharmacy) is medication to be sent to? [address] [id number] - [place], [place] - [address] [place] [address] Phone: [phone]  Fax: [phone]     3. Do they need a 30 day or 90 day supply? 90

## 2023-02-21 NOTE — Telephone Encounter (Signed)
Spoke with pt's husband, Kelby Fam (ok per [facility]) regarding prescription refills requested. Prescriptions sent to [place]. No further needs at this time.

## 2023-03-04 IMAGING — DX DG CHEST 2V
2 series · 2 of 2 positions shown · non-contrast
Comparison: Chest x-ray 03/02/2016

CLINICAL DATA: Cough

EXAM:
CHEST - 2 VIEW

[dg chest 2 view (1 of 2)]
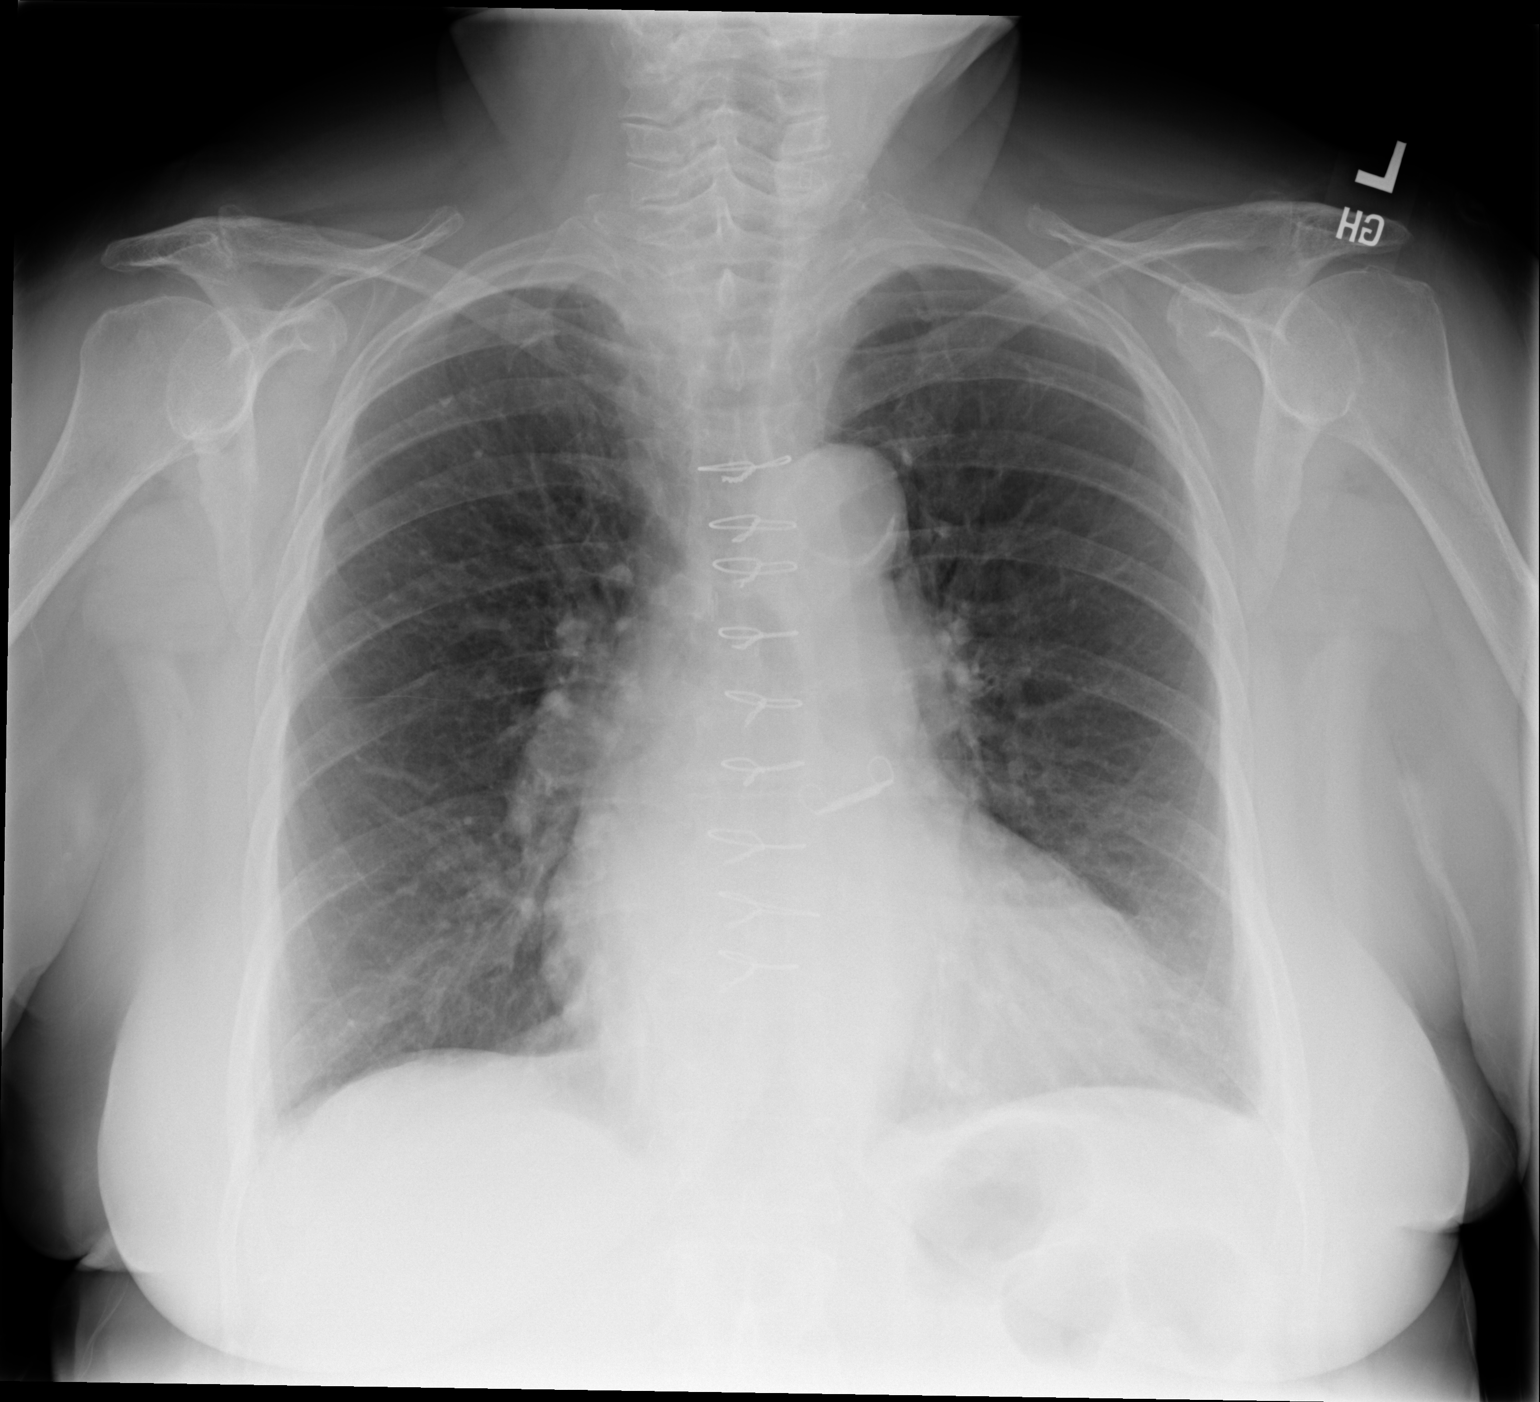

[dg chest 2 view (2 of 2)]
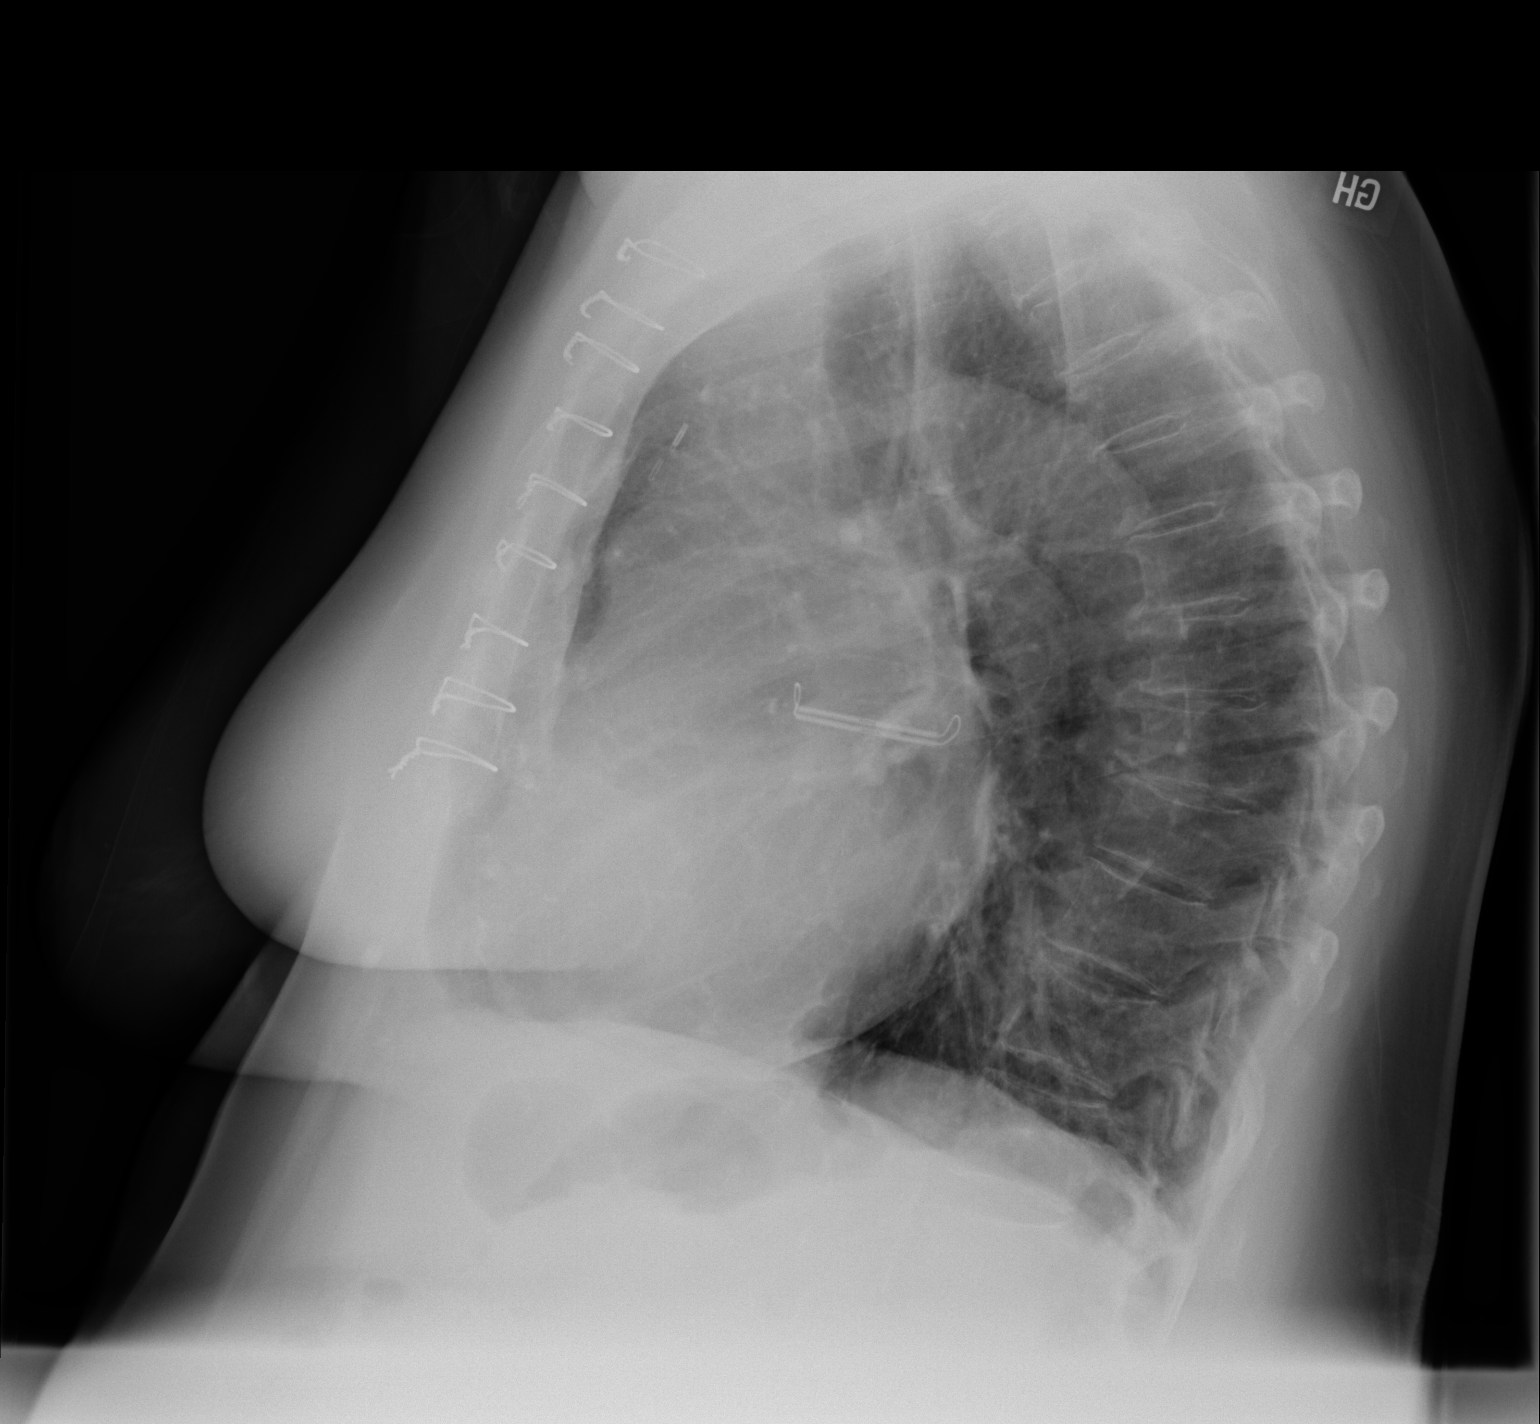

[2 of 2 positions shown; findings below may reference images not displayed]

FINDINGS: Cardiomegaly and mediastinum appear unchanged. Calcified plaques in
the aortic arch. Cardiac surgical changes and median sternotomy
wires. Pulmonary vasculature is within normal limits. No focal
consolidation identified. No pleural effusion or pneumothorax.
IMPRESSION: Cardiomegaly with no acute intrathoracic process identified.

## 2023-03-06 ENCOUNTER — Encounter (INDEPENDENT_AMBULATORY_CARE_PROVIDER_SITE_OTHER): Payer: Medicare Other | Admitting: Ophthalmology

## 2023-03-06 DIAGNOSIS — H353231 Exudative age-related macular degeneration, bilateral, with active choroidal neovascularization: Secondary | ICD-10-CM | POA: Diagnosis not present

## 2023-03-06 DIAGNOSIS — H43813 Vitreous degeneration, bilateral: Secondary | ICD-10-CM

## 2023-03-21 ENCOUNTER — Other Ambulatory Visit: Payer: Self-pay | Admitting: Cardiology

## 2023-03-21 NOTE — Addendum Note (Signed)
 Addended by: Geralyn Flash D on: 03/21/2023 11:57 AM   Modules accepted: Orders

## 2023-03-21 NOTE — Progress Notes (Signed)
 Remote pacemaker transmission.

## 2023-03-22 ENCOUNTER — Telehealth: Payer: Self-pay | Admitting: Cardiology

## 2023-03-22 NOTE — Telephone Encounter (Signed)
*  STAT* If patient is at the pharmacy, call can be transferred to refill team.   1. Which medications need to be refilled? (please list name of each medication and dose if known)  ENTRESTO 24-26 MG  potassium chloride SA (KLOR-CON M) 20 MEQ tablet  2. Which pharmacy/location (including street and city if local pharmacy) is medication to be sent to? [address] [id number] - [place], [place] - [address] [place] [address] Phone: [phone]  Fax: [phone]     3. Do they need a 30 day or 90 day supply? 30  Pt is out of Medication

## 2023-03-22 NOTE — Telephone Encounter (Signed)
 Prescription refill request for Xarelto received.  Indication: PAF Last office visit: 11/27/22 Harriet Pho NP Weight: 87kg Age: 89 Scr: 1.82 on 11/01/22  [facility] CrCl: 30.47  Based on above findings Xarelto 15mg  daily is the appropriate dose.  Refill approved.

## 2023-04-03 ENCOUNTER — Encounter (INDEPENDENT_AMBULATORY_CARE_PROVIDER_SITE_OTHER): Payer: Medicare Other | Admitting: Ophthalmology

## 2023-04-03 DIAGNOSIS — H43813 Vitreous degeneration, bilateral: Secondary | ICD-10-CM

## 2023-04-03 DIAGNOSIS — H35033 Hypertensive retinopathy, bilateral: Secondary | ICD-10-CM | POA: Diagnosis not present

## 2023-04-03 DIAGNOSIS — H47291 Other optic atrophy, right eye: Secondary | ICD-10-CM | POA: Diagnosis not present

## 2023-04-03 DIAGNOSIS — H353231 Exudative age-related macular degeneration, bilateral, with active choroidal neovascularization: Secondary | ICD-10-CM | POA: Diagnosis not present

## 2023-04-03 DIAGNOSIS — I10 Essential (primary) hypertension: Secondary | ICD-10-CM

## 2023-05-01 ENCOUNTER — Encounter (INDEPENDENT_AMBULATORY_CARE_PROVIDER_SITE_OTHER): Admitting: Ophthalmology

## 2023-05-02 ENCOUNTER — Encounter (INDEPENDENT_AMBULATORY_CARE_PROVIDER_SITE_OTHER): Admitting: Ophthalmology

## 2023-05-02 DIAGNOSIS — H353231 Exudative age-related macular degeneration, bilateral, with active choroidal neovascularization: Secondary | ICD-10-CM | POA: Diagnosis not present

## 2023-05-02 DIAGNOSIS — H43813 Vitreous degeneration, bilateral: Secondary | ICD-10-CM

## 2023-05-08 ENCOUNTER — Ambulatory Visit (INDEPENDENT_AMBULATORY_CARE_PROVIDER_SITE_OTHER): Payer: Medicare Other

## 2023-05-08 DIAGNOSIS — I421 Obstructive hypertrophic cardiomyopathy: Secondary | ICD-10-CM | POA: Diagnosis not present

## 2023-05-09 DIAGNOSIS — N939 Abnormal uterine and vaginal bleeding, unspecified: Secondary | ICD-10-CM | POA: Diagnosis not present

## 2023-05-09 DIAGNOSIS — I4891 Unspecified atrial fibrillation: Secondary | ICD-10-CM | POA: Diagnosis not present

## 2023-05-09 DIAGNOSIS — E1122 Type 2 diabetes mellitus with diabetic chronic kidney disease: Secondary | ICD-10-CM | POA: Diagnosis not present

## 2023-05-09 DIAGNOSIS — D6869 Other thrombophilia: Secondary | ICD-10-CM | POA: Diagnosis not present

## 2023-05-09 LAB — CUP PACEART REMOTE DEVICE CHECK
Battery Remaining Longevity: 31 mo
Battery Remaining Percentage: 68 %
Battery Voltage: 2.95 V
Brady Statistic AP VP Percent: 99 %
Brady Statistic AP VS Percent: 1 %
Brady Statistic AS VP Percent: 1 %
Brady Statistic AS VS Percent: 1 %
Brady Statistic RA Percent Paced: 99 %
Brady Statistic RV Percent Paced: 99 %
Date Time Interrogation Session: 20250408020014
Implantable Lead Connection Status: 753985
Implantable Lead Connection Status: 753985
Implantable Lead Implant Date: 20230411
Implantable Lead Implant Date: 20230411
Implantable Lead Location: 753859
Implantable Lead Location: 753860
Implantable Pulse Generator Implant Date: 20230411
Lead Channel Impedance Value: 360 Ohm
Lead Channel Impedance Value: 800 Ohm
Lead Channel Pacing Threshold Amplitude: 1.5 V
Lead Channel Pacing Threshold Amplitude: 2.75 V
Lead Channel Pacing Threshold Pulse Width: 0.5 ms
Lead Channel Pacing Threshold Pulse Width: 0.8 ms
Lead Channel Sensing Intrinsic Amplitude: 0.5 mV
Lead Channel Sensing Intrinsic Amplitude: 5 mV
Lead Channel Setting Pacing Amplitude: 2.5 V
Lead Channel Setting Pacing Amplitude: 4 V
Lead Channel Setting Pacing Pulse Width: 1 ms
Lead Channel Setting Sensing Sensitivity: 4 mV
Pulse Gen Model: 2272
Pulse Gen Serial Number: 8069479

## 2023-05-11 DIAGNOSIS — N950 Postmenopausal bleeding: Secondary | ICD-10-CM | POA: Diagnosis not present

## 2023-05-29 ENCOUNTER — Encounter (INDEPENDENT_AMBULATORY_CARE_PROVIDER_SITE_OTHER): Admitting: Ophthalmology

## 2023-05-29 DIAGNOSIS — N950 Postmenopausal bleeding: Secondary | ICD-10-CM | POA: Diagnosis not present

## 2023-06-07 NOTE — Progress Notes (Signed)
 [name] Date of Birth: February 24, 1934   History of Present Illness: [name] is seen for follow up of atrial flutter and CHF  She was evaluated in 2011 with a Myoview stress test which was normal. She had an Echo at that time that showed moderate LVH with EF 65-70%. Mild MR. She does have a history of murmur. She is not a smoker. In early 2016 she presented with increased dyspnea and an Echo and cardiac MRI were done. These with both consistent with HOCM. LVOT gradient of 112 mm Hg at rest. She was started on low dose lasix  and Toprol  XL with good response initially with improved dyspnea.  Seen last year with symptoms of increased dyspnea. No increased edema or orthopnea but more dyspnea with walking or lifting. No chest pain. No dizziness or palpitations. Energy level has decreased. Repeat Echo showed severe LVOT gradient and MR.  She underwent cardiac cath with findings of LVOT gradient and MR. No significant CAD. Pulmonary HTN. She was referred to the [facility] clinic and underwent surgery on 11/05/15. Prior to surgery she developed Afib and had a DCCV. Surgery  included a septal myectomy, MVR with a #29 mm St. Jude  Biocor valve, left sided Cryo MAZE, and ligation of the left atrial appendage. Her post op course was remarkable for paroxysmal atrial fibrillation and she was started on amiodarone  and Xarelto .  Ecg showed a LBBB. Repeat Echo on 11/11/15 showed normal LV function with EF 60%. Mean MV gradient 8 mm Hg. Trace MR. Mild TR. There was a fixed LVOT gradient of 32 mm Hg peak and mean of 16 mm Hg. Repeat Echo 02/03/16 showed no LVOT gradient and normal MV prosthesis function. There was some concern that amiodarone  caused her to have an optic nerve problem with vision loss - this was discontinued.   She was seen in September 2020 with symptoms of persistent tachycardia and SOB. HR in the 120s. Echo was obtained and showed significant decline in LV function with EF 25-30%. After review  of Ecg tracings and reviewing with EP it appeared she was in Atrial flutter. BNP was 467. TSH was mildly elevated 6.0. She was started on Coreg . Lasix  was increased she was on Xarelto . She underwent  DCCV on 10/23/18. Subsequent Echo in December showed EF had returned to normal.   When seen in February she was back in Atrial flutter- atypical. Once again this was associated with significant drop in EF to 30-35%. Mitral prosthesis was functioning well. Mild to moderate AS. She underwent successful DCCV on 03/29/21. Was seen by Dr Carolynne Citron afterwards on 04/01/21 and was in NSR. He felt there were limited options for AAD therapy given intolerance to amiodarone . Did not feel she was a good candidate for ablation at her age and comorbidities. Consideration of AV node ablation and BiV pacer if flutter recurs or if EF does not recover.   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 Last pacer check in October was functioning well with Afib burden of 16%. Repeat Echo in Jan showed normalization of EF with no outflow gradient. MV prosthesis functioning normally. Moderate pulmonary HTN.   On follow up today she is seen with her husband. She notes that she feels well. She has lost about 4 lbs. Is quite inactive due to arthritic issues. No chest pain, dyspnea, palpitations or dizziness. No edema.    Current Outpatient Medications on File Prior to Visit  Medication Sig Dispense  Refill   acetaminophen  (TYLENOL ) 325 MG tablet Take 1-2 tablets (325-650 mg total) by mouth every 4 (four) hours as needed for mild pain.     brimonidine (ALPHAGAN) 0.15 % ophthalmic solution Place 1 drop into both eyes 2 (two) times daily.     carvedilol  (COREG ) 6.25 MG tablet Take 1 tablet (6.25 mg total) by mouth 2 (two) times daily. 180 tablet 3   empagliflozin  (JARDIANCE ) 10 MG TABS tablet Take 1 tablet (10 mg total) by mouth daily before breakfast. 90 tablet 3   furosemide  (LASIX ) 40 MG tablet Take 1  tablet (40 mg total) by mouth daily. 90 tablet 3   latanoprost (XALATAN) 0.005 % ophthalmic solution Place 1 drop into both eyes at bedtime.     levothyroxine (SYNTHROID) 50 MCG tablet Take 50 mcg by mouth daily before breakfast.     Menthol-Methyl Salicylate (SALONPAS PAIN RELIEF PATCH) PTCH Apply 1 packet topically daily as needed (pain).     potassium chloride  SA (KLOR-CON  M) 20 MEQ tablet Take 1 tablet (20 mEq total) by mouth daily. Pt must keep upcoming appt in May for further refills 90 tablet 0   rosuvastatin  (CRESTOR ) 10 MG tablet Take 1 tablet (10 mg total) by mouth daily. 90 tablet 3   sacubitril -valsartan  (ENTRESTO ) 24-26 MG Take 1 tablet by mouth 2 (two) times daily. Pt must keep upcoming appt in May for further refills 180 tablet 0   XARELTO  15 MG TABS tablet TAKE 1 TABLET BY MOUTH DAILY WITH SUPPER 30 tablet 5   No current facility-administered medications on file prior to visit.    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   SOB (shortness of breath)     Past Surgical History:  Procedure Laterality Date   AV NODE ABLATION N/A 05/10/2021   Procedure: AV NODE ABLATION;  Surgeon: Tammie Fall, MD;  Location: [facility] INVASIVE CV LAB;  Service: Cardiovascular;  Laterality: N/A;   CARDIAC CATHETERIZATION N/A 07/06/2015   Procedure: Right/Left Heart Cath and Coronary Angiography;  Surgeon: Jonnelle Lawniczak M [place], MD;  Location: [facility] INVASIVE CV LAB;  Service: Cardiovascular;  Laterality: N/A;   CARDIOVERSION N/A  10/23/2018   Procedure: CARDIOVERSION;  Surgeon: Wendie Hamburg, MD;  Location: [facility] ENDOSCOPY;  Service: Endoscopy;  Laterality: N/A;   CARDIOVERSION N/A 03/29/2021   Procedure: CARDIOVERSION;  Surgeon: Luana Rumple, MD;  Location: [facility] ENDOSCOPY;  Service: Cardiovascular;  Laterality: N/A;   LEAD REVISION/REPAIR N/A 06/24/2021   Procedure: LEAD REVISION/REPAIR;  Surgeon: Tammie Fall, MD;  Location: [facility] INVASIVE CV LAB;  Service: Cardiovascular;  Laterality: N/A;   MITRAL VALVE REPLACEMENT N/A 11/15/2015   ligation of left atrial appendage, maze procedure at [facility] IMPLANT N/A 05/10/2021   Procedure: PACEMAKER IMPLANT;  Surgeon: Tammie Fall, MD;  Location: [facility] INVASIVE CV LAB;  Service: Cardiovascular;  Laterality: N/A;    Social History   Tobacco Use  Smoking Status Former  Current packs/day: 0.00   Types: Cigarettes   Quit date: 05/26/1965   Years since quitting: 58.0  Smokeless Tobacco Never    Social History   Substance and Sexual Activity  Alcohol Use Yes   Alcohol/week: 1.0 standard drink of alcohol   Types: 1 Standard drinks or equivalent per week   Comment: socially    Family History  Problem Relation Age of Onset   Heart failure Mother 4   Heart attack Father 21    Review of Systems: As noted in HPI.   All other systems were reviewed and are negative.  Physical Exam: BP 130/80   Pulse 63   Ht 5\' 2"  (1.575 m)   Wt 187 lb 6.4 oz (85 kg)   SpO2 94%   BMI 34.28 kg/m   Body mass index is 34.28 kg/m. GENERAL:  Well appearing obese WF in NAD HEENT:  PERRL, EOMI, sclera are clear. Oropharynx is clear. NECK:  No jugular venous distention, carotid upstroke brisk and symmetric, no bruits, no thyromegaly or adenopathy LUNGS:  Clear to auscultation bilaterally CHEST:  Unremarkable HEART:  RRR- tachy.   PMI not displaced or sustained,S1 and S2 within normal limits, no S3, no S4: no clicks, no rubs, gr 1/6 systolic murmur at the  apex>> RUSB ABD:  Soft, nontender. BS +, no masses or bruits. No hepatomegaly, no splenomegaly EXT:  2 + pulses throughout, tr edema, no cyanosis no clubbing SKIN:  Warm and dry.  No rashes NEURO:  Alert and oriented x 3. Cranial nerves II through XII intact. PSYCH:  Cognitively intact    LABORATORY DATA:   Lab Results  Component Value Date   WBC 8.8 06/23/2021   HGB 13.8 06/23/2021   HCT 42.1 06/23/2021   PLT 236 06/23/2021   GLUCOSE 112 (H) 06/23/2021   CHOL 145 03/22/2021   TRIG 172 (H) 03/22/2021   HDL 46 03/22/2021   LDLCALC 70 03/22/2021   ALT 23 03/22/2021   AST 19 03/22/2021   NA 144 06/23/2021   K 4.7 06/23/2021   CL 108 (H) 06/23/2021   CREATININE 1.52 (H) 06/23/2021   BUN 29 (H) 06/23/2021   CO2 16 (L) 06/23/2021   TSH 7.730 (H) 03/22/2021   INR 1.03 07/06/2015   Labs dated 03/11/16: creatinine 1.6 Dated 07/27/16: cholesterol 192, triglycerides 165, HDL 52, LDL 107. Creatinine 1.27. Potassium and ALT normal.  Dated 02/07/17: BUN 27, creatinine 1.25. Glucose 113. Other chemistries normal. Cholesterol 199, triglycerides 212, HDL 44, LDL 113.  Dated 02/08/18: cholesterol 161, triglycerides 209, HDL 44, LDL 75. LFTs normal. Dated 10/13/19: A1c 6.7%. triglycerides 269, HDL 39. Unable to see other results in [facility] Dated 01/13/20: BUN 33, creatinine 1.76. glucose 137. Potassium 5.1. sodium 144. A1c 6.4% Dated 12/03/20: cholesterol 151, triglycerides 185, HDL 42, LDL 78. A1c 6.5%. BUN 37, creatinine 1.67. otherwise CMET normal. Dated 12/08/21: cholesterol 143, triglycerides 279, HDL 37, LDL 61, A1c 6.6%. creatinine 1.72. otherwise CMET and CBC normal. Dated 12/13/22: A1c 6.5%. cholesterol 125, triglycerides 134, HDL 42, LDL 59. Creatinine 1.82. Potassium and TSH normal.   EKG Interpretation Date/Time:  Monday Jun 11 2023 16:27:59 EDT Ventricular Rate:  63 PR Interval:  176 QRS Duration:  168 QT Interval:  490 QTC Calculation: 501 R Axis:   -53  Text Interpretation: AV  dual-paced rhythm When compared with ECG of Apr 29.2024  No significant change was found  Confirmed by [place], Vieva Brummitt [phone]) on 06/11/2023 4:36:43 PM    Echo 02/03/16: Mohammed Andrew  Conclusions   - Left ventricle: The cavity size was normal. There was moderate   concentric hypertrophy. Systolic function was normal. The   estimated ejection fraction was in the range of 60% to 65%. Wall   motion was normal; there were no regional wall motion   abnormalities. The study is not technically sufficient to allow   evaluation of LV diastolic function due to the presence of a   bioprosthetic mitral valve. - Aortic valve: Valve mobility was mildly restricted. There was   mild stenosis. There was mild regurgitation. - Mitral valve: Mildly calcified annulus. A 29mm [employer]. Jude biocor   bioprosthesis was present and functioning well. - Left atrium: The atrium was severely dilated. - Right ventricle: The cavity size was normal. Wall thickness was   normal. Systolic function was normal. - Tricuspid valve: There was mild regurgitation. - Pulmonary arteries: Systolic pressure was mildly increased. PA   peak pressure: 42 mm Hg (S).   Impressions:   - Since the last echo 06/15/2015, a bioprosthetic mitral valve is   present and functioning well. Septal wall thickness is now 1.5 cm   compared with 1.8 cm pre-operatively.  Echo 04/19/17: Study Conclusions   - Valve surgery. Mitral valve replacement with a [employer]   porcine valve. 29mm Biocor bioprosthetic valve. - Left ventricle: The cavity size was normal. Wall thickness was   increased in a pattern of mild LVH. Systolic function was normal.   The estimated ejection fraction was in the range of 55% to 60%.   Wall motion was normal; there were no regional wall motion   abnormalities. Doppler parameters are consistent with a   reversible restrictive pattern, indicative of decreased left   ventricular diastolic compliance and/or increased left atrial    pressure (grade 3 diastolic dysfunction). - Aortic valve: There was trivial regurgitation. Mean gradient (S):   11 mm Hg. Peak gradient (S): 19 mm Hg. - Mitral valve: A bioprosthesis was present. The prosthesis had a   normal range of motion. Mean gradient (D): 5 mm Hg. - Left atrium: The atrium was mildly dilated. - Pulmonary arteries: Systolic pressure was moderately increased.   PA peak pressure: 56 mm Hg (S).  Echocardiogram 10/15/2018: 1. The left ventricle has severely reduced systolic function, with an ejection fraction of 25-30%. The cavity size was normal. Left ventricular diastolic function could not be evaluated. Elevated mean left atrial pressure Left ventricular diffuse  hypokinesis.  2. The right ventricle has moderately reduced systolic function. The cavity was normal. Right ventricular systolic pressure is mildly elevated with an estimated pressure of 41.8 mmHg.  3. Left atrial size was moderately dilated.  4. The tricuspid valve is grossly normal. Tricuspid valve regurgitation is moderate.  5. The aortic valve has an indeterminate number of cusps. Mild calcification of the aortic valve. Aortic valve regurgitation is trivial by color flow Doppler. No stenosis of the aortic valve.  6. The aorta is normal unless otherwise noted.  7. The inferior vena cava was dilated in size with >50% respiratory variability.  8. Septal dyssynergy and global hypokinesis; overall severe LV dysfunction; trace AI; s/p MVR with no MR; moderate LAE; moderate RV dysfunction; moderate TR and mild pulmonary hypertension.  Echo: 01/13/19: IMPRESSIONS     1. Left ventricular ejection fraction, by visual estimation, is 60 to  65%. The left ventricle has normal function. There is severely increased  left ventricular hypertrophy.   2. Left ventricular diastolic function could not be evaluated.  3. The left ventricle has no regional wall motion abnormalities.   4. Global right ventricle has normal  systolic function.The right  ventricular size is normal. No increase in right ventricular wall  thickness.   5. Left atrial size was normal.   6. Right atrial size was normal.   7. The mitral valve has been repaired/replaced. No evidence of mitral  valve regurgitation.   8. The tricuspid valve is grossly normal. Tricuspid valve regurgitation  is trivial.   9. The aortic valve is abnormal. Aortic valve regurgitation is mild. Mild  aortic valve sclerosis without stenosis.  10. Pulmonic regurgitation is mild.  11. The pulmonic valve was normal in structure. Pulmonic valve  regurgitation is mild.  12. The atrial septum is grossly normal.    Echo 03/25/21: IMPRESSIONS     1. In afib/flutter with RVR during study. Left ventricular ejection  fraction, by estimation, is 30 to 35%. The left ventricle has moderately  decreased function. The left ventricle demonstrates global hypokinesis.  There is mild left ventricular  hypertrophy. Left ventricular diastolic parameters are indeterminate.   2. Right ventricular systolic function is moderately reduced. The right  ventricular size is normal. There is normal pulmonary artery systolic  pressure. The estimated right ventricular systolic pressure is 34.6 mmHg.   3. Left atrial size was moderately dilated.   4. There is a Biocor tissue heart valve present in the mitral position.  Procedure Date: 11/15/2015.      Trivial mitral valve regurgitation. Echo findings are consistent with  stenosis of the mitral prosthesis.      The mean mitral valve gradient is 10.0 mmHg at 123 bpm. MVA 1.3 cm^2  by continuity equation   5. The aortic valve was not well visualized. Aortic valve regurgitation  is not visualized. Mild to moderate aortic valve stenosis. Mild AS by  gradients (MG 12 mmHg, Vmax 2.4 m/s), moderate by AVA (1.0cm^2) and DI  (0.35). Low SV index (22 cc/m^2), likely   low flow low gradient moderate AS   6. The inferior vena cava is normal in  size with greater than 50%  respiratory variability, suggesting right atrial pressure of 3 mmHg.   Echo 02/20/22: IMPRESSIONS     1. History of HOCM s/p septal myoectomy. Left ventricular ejection  fraction, by estimation, is 55 to 60%. The left ventricle has normal  function. The left ventricle has no regional wall motion abnormalities.  Left ventricular diastolic function could not   be evaluated.   2. Right ventricular systolic function is normal. The right ventricular  size is normal. There is normal pulmonary artery systolic pressure. The  estimated right ventricular systolic pressure is 26.4 mmHg.   3. Left atrial size was mildly dilated.   4. The mitral valve has been repaired/replaced. No evidence of mitral  valve regurgitation. The mean mitral valve gradient is 4.0 mmHg with  average heart rate of 69 bpm. There is a 27 mm bioprosthetic valve present  in the mitral position. Procedure Date:   11/15/2015. Echo findings are consistent with normal structure and  function of the mitral valve prosthesis.   5. The aortic valve is tricuspid. Aortic valve regurgitation is trivial.  Aortic valve sclerosis/calcification is present, without any evidence of  aortic stenosis.   6. The inferior vena cava is normal in size with greater than 50%  respiratory variability, suggesting right atrial pressure of 3 mmHg.   Comparison(s): Changes from prior study are noted. The left ventricular  function  has improved.     Assessment / Plan: 1. HOCM.  s/p septal myectomy on 11/05/15. Excellent result by follow up Echo. No significant residual LVOT gradient. Clinically doing well.   2. Severe MR s/p MVR with #29 St. Jude Biocor valve. Routine SBE prophylaxis.   3.  Chronic systolic CHF. LV dysfunction associated in the past with atrial flutter. EF 25-30%. Likely tachycardia mediated with new Atrial flutter with RVR. After DCCV EF  normalized based on Echo in December 2020.  Last February had  recurrent Atrial flutter EF is back down to 30-35%. Has since had DDD pacer in place. Once again EF normalized with restoration of NSR.   4. Atrial flutter sustained.  post op open heart surgery. S/p left sided MAZE. Seen in September 2020 with  new onset Atrial flutter.  DCCV on 10/23/18. Had been on amiodarone  post op heart surgery. Patient questions if this was the cause of her optic nerve problem. Has been on Xarelto  long term and hasn't missed any doses. Now with recurrence in Feb  2023  S/p DCCV. Seen by Dr Carolynne Citron- poor candidate for AAD therapy or ablation. Now s/p pacemaker implant. Rate controlled.  5. Hypercholesterolemia. On low dose Crestor . No history of CAD. LDL 59   6. CKD stage 3.   7. Obesity.    Follow up in 6 months.

## 2023-06-11 ENCOUNTER — Encounter: Payer: Self-pay | Admitting: Cardiology

## 2023-06-11 ENCOUNTER — Ambulatory Visit: Payer: Medicare Other | Attending: Cardiology | Admitting: Cardiology

## 2023-06-11 VITALS — BP 130/80 | HR 63 | Ht 62.0 in | Wt 187.4 lb

## 2023-06-11 DIAGNOSIS — I10 Essential (primary) hypertension: Secondary | ICD-10-CM | POA: Insufficient documentation

## 2023-06-11 DIAGNOSIS — Z952 Presence of prosthetic heart valve: Secondary | ICD-10-CM | POA: Insufficient documentation

## 2023-06-11 DIAGNOSIS — I442 Atrioventricular block, complete: Secondary | ICD-10-CM | POA: Diagnosis present

## 2023-06-11 DIAGNOSIS — I421 Obstructive hypertrophic cardiomyopathy: Secondary | ICD-10-CM | POA: Diagnosis present

## 2023-06-11 NOTE — Patient Instructions (Signed)

## 2023-06-19 ENCOUNTER — Other Ambulatory Visit: Payer: Self-pay | Admitting: Cardiology

## 2023-06-19 ENCOUNTER — Telehealth: Payer: Self-pay | Admitting: Cardiology

## 2023-06-19 MED ORDER — ENTRESTO 24-26 MG PO TABS: 1.0000 | ORAL_TABLET | Freq: Two times a day (BID) | ORAL | 3 refills | Status: AC

## 2023-06-19 MED ORDER — POTASSIUM CHLORIDE CRYS ER 20 MEQ PO TBCR: 20.0000 meq | EXTENDED_RELEASE_TABLET | Freq: Every day | ORAL | 3 refills | Status: AC

## 2023-06-19 NOTE — Telephone Encounter (Signed)
 Pt's medications were sent to pt's pharmacy as requested. Confirmation received.

## 2023-06-19 NOTE — Telephone Encounter (Signed)
*  STAT* If patient is at the pharmacy, call can be transferred to refill team.   1. Which medications need to be refilled? (please list name of each medication and dose if known) potassium chloride  SA (KLOR-CON  M) 20 MEQ tablet   sacubitril -valsartan  (ENTRESTO ) 24-26 MG    2. Would you like to learn more about the convenience, safety, & potential cost savings by using the [facility]? No   3. Are you open to using the [facility] (Type [facility] ) No   4. Which pharmacy/location (including street and city if local pharmacy) is medication to be sent to?  [id number] - [place], [place] - [address]   5. Do they need a 30 day or 90 day supply? 30 day

## 2023-06-26 NOTE — Addendum Note (Signed)
 Addended by: Lott Rouleau A on: 06/26/2023 11:20 AM   Modules accepted: Orders

## 2023-06-26 NOTE — Progress Notes (Signed)
 Remote pacemaker transmission.

## 2023-06-27 DIAGNOSIS — Z7901 Long term (current) use of anticoagulants: Secondary | ICD-10-CM | POA: Diagnosis not present

## 2023-06-27 DIAGNOSIS — I4891 Unspecified atrial fibrillation: Secondary | ICD-10-CM | POA: Diagnosis not present

## 2023-06-27 DIAGNOSIS — J988 Other specified respiratory disorders: Secondary | ICD-10-CM | POA: Diagnosis not present

## 2023-06-27 DIAGNOSIS — E1122 Type 2 diabetes mellitus with diabetic chronic kidney disease: Secondary | ICD-10-CM | POA: Diagnosis not present

## 2023-06-27 DIAGNOSIS — R4189 Other symptoms and signs involving cognitive functions and awareness: Secondary | ICD-10-CM | POA: Diagnosis not present

## 2023-07-05 ENCOUNTER — Encounter (INDEPENDENT_AMBULATORY_CARE_PROVIDER_SITE_OTHER): Admitting: Ophthalmology

## 2023-07-05 DIAGNOSIS — H43813 Vitreous degeneration, bilateral: Secondary | ICD-10-CM

## 2023-07-05 DIAGNOSIS — H353231 Exudative age-related macular degeneration, bilateral, with active choroidal neovascularization: Secondary | ICD-10-CM

## 2023-08-07 ENCOUNTER — Ambulatory Visit: Payer: Medicare Other

## 2023-08-07 DIAGNOSIS — I442 Atrioventricular block, complete: Secondary | ICD-10-CM

## 2023-08-09 ENCOUNTER — Ambulatory Visit: Payer: Self-pay | Admitting: Internal Medicine

## 2023-08-09 ENCOUNTER — Telehealth: Payer: Self-pay

## 2023-08-09 LAB — CUP PACEART REMOTE DEVICE CHECK
Battery Remaining Longevity: 26 mo
Battery Remaining Percentage: 62 %
Battery Voltage: 2.95 V
Brady Statistic AP VP Percent: 99 %
Brady Statistic AP VS Percent: 1 %
Brady Statistic AS VP Percent: 1 %
Brady Statistic AS VS Percent: 1 %
Brady Statistic RA Percent Paced: 99 %
Brady Statistic RV Percent Paced: 99 %
Date Time Interrogation Session: 20250708020015
Implantable Lead Connection Status: 753985
Implantable Lead Connection Status: 753985
Implantable Lead Implant Date: 20230411
Implantable Lead Implant Date: 20230411
Implantable Lead Location: 753859
Implantable Lead Location: 753860
Implantable Pulse Generator Implant Date: 20230411
Lead Channel Impedance Value: 300 Ohm
Lead Channel Impedance Value: 730 Ohm
Lead Channel Pacing Threshold Amplitude: 1.5 V
Lead Channel Pacing Threshold Amplitude: 2.75 V
Lead Channel Pacing Threshold Pulse Width: 0.5 ms
Lead Channel Pacing Threshold Pulse Width: 0.8 ms
Lead Channel Sensing Intrinsic Amplitude: 0.5 mV
Lead Channel Sensing Intrinsic Amplitude: 5 mV
Lead Channel Setting Pacing Amplitude: 2.5 V
Lead Channel Setting Pacing Amplitude: 4 V
Lead Channel Setting Pacing Pulse Width: 1 ms
Lead Channel Setting Sensing Sensitivity: 4 mV
Pulse Gen Model: 2272
Pulse Gen Serial Number: 8069479

## 2023-08-09 NOTE — Telephone Encounter (Addendum)
 Alert received from [employer]:  Scheduled remote reviewed. Normal device function.  Presenting rhythm: AP/VP Short bursts of noise detected on the ventricular lead  Pt will need follow up to assess.  Pt scheduled 09/05/2023 with Dr. Waddell.

## 2023-08-13 DIAGNOSIS — E039 Hypothyroidism, unspecified: Secondary | ICD-10-CM | POA: Diagnosis not present

## 2023-08-22 DIAGNOSIS — D225 Melanocytic nevi of trunk: Secondary | ICD-10-CM | POA: Diagnosis not present

## 2023-08-22 DIAGNOSIS — L570 Actinic keratosis: Secondary | ICD-10-CM | POA: Diagnosis not present

## 2023-08-22 DIAGNOSIS — L821 Other seborrheic keratosis: Secondary | ICD-10-CM | POA: Diagnosis not present

## 2023-08-22 DIAGNOSIS — L565 Disseminated superficial actinic porokeratosis (DSAP): Secondary | ICD-10-CM | POA: Diagnosis not present

## 2023-08-22 DIAGNOSIS — Z85828 Personal history of other malignant neoplasm of skin: Secondary | ICD-10-CM | POA: Diagnosis not present

## 2023-08-23 ENCOUNTER — Encounter (INDEPENDENT_AMBULATORY_CARE_PROVIDER_SITE_OTHER): Admitting: Ophthalmology

## 2023-08-23 DIAGNOSIS — H353231 Exudative age-related macular degeneration, bilateral, with active choroidal neovascularization: Secondary | ICD-10-CM

## 2023-08-23 DIAGNOSIS — H43813 Vitreous degeneration, bilateral: Secondary | ICD-10-CM

## 2023-09-05 ENCOUNTER — Encounter: Payer: Self-pay | Admitting: Internal Medicine

## 2023-09-05 ENCOUNTER — Ambulatory Visit (HOSPITAL_COMMUNITY)
Admission: RE | Admit: 2023-09-05 | Discharge: 2023-09-05 | Disposition: A | Discharge status: Home / Self Care | Source: Ambulatory Visit | Attending: Internal Medicine | Admitting: Internal Medicine

## 2023-09-05 ENCOUNTER — Ambulatory Visit: Admitting: Internal Medicine

## 2023-09-05 VITALS — BP 92/56 | HR 63 | Ht 62.0 in | Wt 187.0 lb

## 2023-09-05 DIAGNOSIS — R0602 Shortness of breath: Secondary | ICD-10-CM

## 2023-09-05 DIAGNOSIS — I442 Atrioventricular block, complete: Secondary | ICD-10-CM | POA: Diagnosis not present

## 2023-09-05 LAB — CUP PACEART INCLINIC DEVICE CHECK
Battery Remaining Longevity: 26 mo
Battery Voltage: 2.95 V
Brady Statistic RA Percent Paced: 99.02 %
Brady Statistic RV Percent Paced: 99.89 %
Date Time Interrogation Session: 20250806165559
Implantable Lead Connection Status: 753985
Implantable Lead Connection Status: 753985
Implantable Lead Implant Date: 20230411
Implantable Lead Implant Date: 20230411
Implantable Lead Location: 753859
Implantable Lead Location: 753860
Implantable Pulse Generator Implant Date: 20230411
Lead Channel Impedance Value: 362.5 Ohm
Lead Channel Impedance Value: 775 Ohm
Lead Channel Pacing Threshold Amplitude: 0.5 V
Lead Channel Pacing Threshold Amplitude: 0.5 V
Lead Channel Pacing Threshold Amplitude: 1.75 V
Lead Channel Pacing Threshold Amplitude: 1.75 V
Lead Channel Pacing Threshold Pulse Width: 0.5 ms
Lead Channel Pacing Threshold Pulse Width: 0.5 ms
Lead Channel Pacing Threshold Pulse Width: 1 ms
Lead Channel Pacing Threshold Pulse Width: 1 ms
Lead Channel Sensing Intrinsic Amplitude: 0.5 mV
Lead Channel Sensing Intrinsic Amplitude: 5 mV
Lead Channel Setting Pacing Amplitude: 2 V
Lead Channel Setting Pacing Amplitude: 4 V
Lead Channel Setting Pacing Pulse Width: 1 ms
Lead Channel Setting Sensing Sensitivity: 8 mV
Pulse Gen Model: 2272
Pulse Gen Serial Number: 8069479

## 2023-09-05 MED ORDER — FUROSEMIDE 40 MG PO TABS: ORAL_TABLET | ORAL | 3 refills | Status: AC

## 2023-09-05 NOTE — Progress Notes (Signed)
 HPI Melinda Gonzales returns today for followup. She is a pleasant 89 yo woman with uncontrolled atrial fib who underwent PPM and av node ablation and then had dislodgement of her RV lead which had pulled back into the SVC. 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 Since her revision she has done well. No chest pain or sob. No syncope. No palpitations. Her husband who prescribes her meds notes that she has taken her lasix  daily and she has had some PND/orthopnea.  Allergies  Allergen Reactions   Oxycodone Nausea And Vomiting and Other (See Comments)    And the sweats  And the sweats    And the sweats And the sweats   Statins     Myalgias tolerates crestor      Current Outpatient Medications  Medication Sig Dispense Refill   acetaminophen  (TYLENOL ) 325 MG tablet Take 1-2 tablets (325-650 mg total) by mouth every 4 (four) hours as needed for mild pain.     brimonidine (ALPHAGAN) 0.15 % ophthalmic solution Place 1 drop into both eyes 2 (two) times daily.     carvedilol  (COREG ) 6.25 MG tablet Take 1 tablet (6.25 mg total) by mouth 2 (two) times daily. 180 tablet 3   empagliflozin  (JARDIANCE ) 10 MG TABS tablet Take 1 tablet (10 mg total) by mouth daily before breakfast. 90 tablet 3   furosemide  (LASIX ) 40 MG tablet Take 1 tablet (40 mg total) by mouth daily. 90 tablet 3   latanoprost (XALATAN) 0.005 % ophthalmic solution Place 1 drop into both eyes at bedtime.     levothyroxine (SYNTHROID) 50 MCG tablet Take 50 mcg by mouth daily before breakfast.     Menthol-Methyl Salicylate (SALONPAS PAIN RELIEF PATCH) PTCH Apply 1 packet topically daily as needed (pain).     potassium chloride  SA (KLOR-CON  M) 20 MEQ tablet Take 1 tablet (20 mEq total) by mouth daily. 90 tablet 3   rosuvastatin  (CRESTOR ) 10 MG tablet Take 1 tablet (10 mg total) by mouth daily. 90 tablet 3   sacubitril -valsartan  (ENTRESTO ) 24-26 MG Take 1 tablet by mouth 2 (two)  times daily. 180 tablet 3   XARELTO  15 MG TABS tablet TAKE 1 TABLET BY MOUTH DAILY WITH SUPPER 30 tablet 5   No current facility-administered medications for this visit.     Past Medical History:  Diagnosis Date   Chronic diastolic CHF (congestive heart failure) (HCC) 07/06/2015   Dyspnea    Heart murmur    Hypercholesterolemia    Hypertrophic obstructive cardiomyopathy (HOCM) (HCC)    LVH (left ventricular hypertrophy)    WITH NORMAL SYSTOLIC FUNCTION   Pacemaker lead failure 06/25/2021   SOB (shortness of breath)     ROS:   All systems reviewed and negative except as noted in the HPI.   Past Surgical History:  Procedure Laterality Date   AV NODE ABLATION N/A 05/10/2021   Procedure: AV NODE ABLATION;  Surgeon: Waddell Melinda ORN, MD;  Location: [facility] INVASIVE CV LAB;  Service: Cardiovascular;  Laterality: N/A;   CARDIAC CATHETERIZATION N/A 07/06/2015   Procedure: Right/Left Heart Cath and Coronary Angiography;  Surgeon: Peter M Swaziland, MD;  Location: [facility] INVASIVE CV LAB;  Service: Cardiovascular;  Laterality: N/A;   CARDIOVERSION N/A 10/23/2018   Procedure: CARDIOVERSION;  Surgeon: Kate Lonni CROME, MD;  Location: [facility] ENDOSCOPY;  Service: Endoscopy;  Laterality: N/A;   CARDIOVERSION N/A 03/29/2021   Procedure: CARDIOVERSION;  Surgeon: Francyne Headland, MD;  Location: [facility] ENDOSCOPY;  Service: Cardiovascular;  Laterality: N/A;   LEAD REVISION/REPAIR N/A 06/24/2021   Procedure: LEAD REVISION/REPAIR;  Surgeon: Waddell Melinda ORN, MD;  Location: [facility] INVASIVE CV LAB;  Service: Cardiovascular;  Laterality: N/A;   MITRAL VALVE REPLACEMENT N/A 11/15/2015   ligation of left atrial appendage, maze procedure at [facility] IMPLANT N/A 05/10/2021   Procedure: PACEMAKER IMPLANT;  Surgeon: Waddell Melinda ORN, MD;  Location[facility] INVASIVE CV LAB;  Service: Cardiovascular;  Laterality: N/A;     Family History  Problem Relation Age of Onset   Heart failure Mother 66   Heart attack Father 7      Social History   Socioeconomic History   Marital status: Married    Spouse name: Not on file   Number of children: 2   Years of education: Not on file   Highest education level: Not on file  Occupational History    Employer: RETIRED    Comment: retired  Tobacco Use   Smoking status: Former    Current packs/day: 0.00    Types: Cigarettes    Quit date: 05/26/1965    Years since quitting: 58.3   Smokeless tobacco: Never  Vaping Use   Vaping status: Never Used  Substance and Sexual Activity   Alcohol use: Yes    Alcohol/week: 1.0 standard drink of alcohol    Types: 1 Standard drinks or equivalent per week    Comment: socially   Drug use: No   Sexual activity: Yes  Other Topics Concern   Not on file  Social History Narrative   Not on file   Social Drivers of Health   Financial Resource Strain: Not on file  Food Insecurity: Not on file  Transportation Needs: Not on file  Physical Activity: Not on file  Stress: Not on file  Social Connections: Not on file  Intimate Partner Violence: Not on file     BP (!) 92/56   Pulse 63   Ht 5' 2 (1.575 m)   Wt 187 lb (84.8 kg)   SpO2 98%   BMI 34.20 kg/m   Physical Exam:  Well appearing NAD HEENT: Unremarkable Neck:  No JVD, no thyromegally Lymphatics:  No adenopathy Back:  No CVA tenderness Lungs:  Clear with minimal basilar rales.  HEART:  Regular rate rhythm, no murmurs, no rubs, no clicks Abd:  soft, positive bowel sounds, no organomegally, no rebound, no guarding Ext:  2 plus pulses, no edema, no cyanosis, no clubbing Skin:  No rashes no nodules Neuro:  CN II through XII intact, motor grossly intact  EKG - AV pacing  DEVICE  Normal device function.  See PaceArt for details.   Assess/Plan: Acute on chronic diastolic heart failure - I have asked her to increase lasix  to 40 bid on M/W/F and 40 mg daily the other days of the week. Chronic atrial fib - she has no escape today. Her rates are well controlled  s/p AV node ablation.  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   Melinda Hiram Mciver,MD

## 2023-09-05 NOTE — Patient Instructions (Addendum)
 Medication Instructions:  Your physician has recommended you make the following change in your medication:  Increase Lasix : Take 1 tablet twice daily on Monday, Wednesday, and Friday. Other days take once daily.  Lab Work: BMP in 3 weeks  You may go to any [facility] Location for your lab work:  [facility] - [address] ([facility]) - [address] [phone] [address]  [place] - [address]   [place]  - [address]   [place] - [address] - [address] [employer]  - [address] [place] [address] ([facility]   If you have labs (blood work) drawn today and your tests are completely normal, you will receive your results only by: [employer] (if you have MyChart)  If you have any lab test that is abnormal or we need to change your treatment, we will call you or send a MyChart message to review the results.  Testing/Procedures: Chest X-ray   Follow-Up: At [facility], you and your health needs are our priority.  As part of our continuing mission to provide you with exceptional heart care, we have created designated Provider Care Teams.  These Care Teams include your primary Cardiologist (physician) and Advanced Practice Providers (APPs -  Physician Assistants and Nurse Practitioners) who all work together to provide you with the care you need, when you need it.  Your next appointment:   6 months  The format for your next appointment:   In Person  Provider:   Donnice Primus, MD or one of the following Advanced Practice Providers on your designated Care Team:   Charlies Arthur, [place] Ozell Jodie Passey, [place] Leotis Barrack, NP  Note: Remote monitoring is used to monitor your Pacemaker/ ICD from home. This monitoring reduces the number of office visits required to check your device to one time per year. It allows us  to keep an eye on the  functioning of your device to ensure it is working properly.

## 2023-09-06 ENCOUNTER — Telehealth: Payer: Self-pay | Admitting: Cardiology

## 2023-09-06 LAB — BASIC METABOLIC PANEL WITH GFR
BUN/Creatinine Ratio: 21 (ref 12–28)
BUN: 41 mg/dL — ABNORMAL HIGH (ref 8–27)
CO2: 18 mmol/L — ABNORMAL LOW (ref 20–29)
Calcium: 9.2 mg/dL (ref 8.7–10.3)
Chloride: 104 mmol/L (ref 96–106)
Creatinine, Ser: 1.99 mg/dL — ABNORMAL HIGH (ref 0.57–1.00)
Glucose: 88 mg/dL (ref 70–99)
Potassium: 4.9 mmol/L (ref 3.5–5.2)
Sodium: 140 mmol/L (ref 134–144)
eGFR: 24 mL/min/1.73 — ABNORMAL LOW (ref >59)

## 2023-09-06 NOTE — Telephone Encounter (Signed)
 Husband calling to get results. Please advise

## 2023-09-06 NOTE — Telephone Encounter (Signed)
Left message for patient or her husband to return the call.

## 2023-09-07 NOTE — Telephone Encounter (Signed)
 Called and spoke to patient's husband Melinda Gonzales (per [facility]). Patients husband calling to hear results of patients chest X ray and lab work from 09/05/23. Notified patient's husband that the results have not been looked over by the provider yet. Advised him that once a result note is available patient will be notified.

## 2023-09-07 NOTE — Telephone Encounter (Signed)
 Husband returning call,  Best number  [phone]

## 2023-09-11 NOTE — Telephone Encounter (Signed)
 Imaging resulted in 8/20 AEM appointment for leadless PPM. Result's to be reviewed at Appointment for leadless consult.

## 2023-09-11 NOTE — Telephone Encounter (Signed)
 Message sent to Dr.Taylor's RN to call back with cxr and lab results when they become available.

## 2023-09-11 NOTE — Telephone Encounter (Addendum)
 Spoke to patient's husband 6 month follow up appointment scheduled with Dr.Jordan 11/3 at 3:00 pm. He wanted Dr.Jordan to know wife has appointment with Dr.Mealor 8/20 to discuss pacemaker.I will make him aware.

## 2023-09-13 ENCOUNTER — Ambulatory Visit: Payer: Self-pay | Admitting: Internal Medicine

## 2023-09-19 ENCOUNTER — Inpatient Hospital Stay (HOSPITAL_COMMUNITY)
Admission: RE | Admit: 2023-09-19 | Discharge: 2023-09-21 | DRG: 229 | Disposition: A | Discharge status: Home / Self Care | Source: Ambulatory Visit | Attending: Cardiovascular Disease | Admitting: Cardiovascular Disease

## 2023-09-19 ENCOUNTER — Other Ambulatory Visit: Payer: Self-pay

## 2023-09-19 ENCOUNTER — Encounter (HOSPITAL_COMMUNITY): Payer: Self-pay

## 2023-09-19 ENCOUNTER — Encounter: Payer: Self-pay | Admitting: Cardiovascular Disease

## 2023-09-19 ENCOUNTER — Ambulatory Visit (INDEPENDENT_AMBULATORY_CARE_PROVIDER_SITE_OTHER): Admitting: Cardiovascular Disease

## 2023-09-19 VITALS — BP 110/62 | HR 62 | Ht 62.0 in | Wt 176.0 lb

## 2023-09-19 DIAGNOSIS — Z7984 Long term (current) use of oral hypoglycemic drugs: Secondary | ICD-10-CM | POA: Diagnosis not present

## 2023-09-19 DIAGNOSIS — I4891 Unspecified atrial fibrillation: Secondary | ICD-10-CM | POA: Diagnosis not present

## 2023-09-19 DIAGNOSIS — E7800 Pure hypercholesterolemia, unspecified: Secondary | ICD-10-CM | POA: Diagnosis present

## 2023-09-19 DIAGNOSIS — I442 Atrioventricular block, complete: Secondary | ICD-10-CM

## 2023-09-19 DIAGNOSIS — Z953 Presence of xenogenic heart valve: Secondary | ICD-10-CM

## 2023-09-19 DIAGNOSIS — R011 Cardiac murmur, unspecified: Secondary | ICD-10-CM | POA: Diagnosis present

## 2023-09-19 DIAGNOSIS — T82120A Displacement of cardiac electrode, initial encounter: Principal | ICD-10-CM | POA: Diagnosis present

## 2023-09-19 DIAGNOSIS — R001 Bradycardia, unspecified: Secondary | ICD-10-CM | POA: Diagnosis present

## 2023-09-19 DIAGNOSIS — Z006 Encounter for examination for normal comparison and control in clinical research program: Secondary | ICD-10-CM | POA: Diagnosis not present

## 2023-09-19 DIAGNOSIS — I421 Obstructive hypertrophic cardiomyopathy: Secondary | ICD-10-CM

## 2023-09-19 DIAGNOSIS — Z885 Allergy status to narcotic agent status: Secondary | ICD-10-CM | POA: Diagnosis not present

## 2023-09-19 DIAGNOSIS — I484 Atypical atrial flutter: Secondary | ICD-10-CM | POA: Diagnosis not present

## 2023-09-19 DIAGNOSIS — N179 Acute kidney failure, unspecified: Secondary | ICD-10-CM | POA: Diagnosis present

## 2023-09-19 DIAGNOSIS — Z79890 Hormone replacement therapy (postmenopausal): Secondary | ICD-10-CM | POA: Diagnosis not present

## 2023-09-19 DIAGNOSIS — Z87891 Personal history of nicotine dependence: Secondary | ICD-10-CM | POA: Diagnosis not present

## 2023-09-19 DIAGNOSIS — N184 Chronic kidney disease, stage 4 (severe): Secondary | ICD-10-CM | POA: Diagnosis present

## 2023-09-19 DIAGNOSIS — Z7901 Long term (current) use of anticoagulants: Secondary | ICD-10-CM

## 2023-09-19 DIAGNOSIS — Z79899 Other long term (current) drug therapy: Secondary | ICD-10-CM

## 2023-09-19 DIAGNOSIS — I4821 Permanent atrial fibrillation: Secondary | ICD-10-CM | POA: Diagnosis present

## 2023-09-19 DIAGNOSIS — I5032 Chronic diastolic (congestive) heart failure: Secondary | ICD-10-CM | POA: Diagnosis not present

## 2023-09-19 DIAGNOSIS — Z888 Allergy status to other drugs, medicaments and biological substances status: Secondary | ICD-10-CM

## 2023-09-19 DIAGNOSIS — Z950 Presence of cardiac pacemaker: Secondary | ICD-10-CM | POA: Diagnosis not present

## 2023-09-19 DIAGNOSIS — Y712 Prosthetic and other implants, materials and accessory cardiovascular devices associated with adverse incidents: Secondary | ICD-10-CM | POA: Diagnosis present

## 2023-09-19 DIAGNOSIS — I4729 Other ventricular tachycardia: Secondary | ICD-10-CM | POA: Diagnosis not present

## 2023-09-19 DIAGNOSIS — Z8249 Family history of ischemic heart disease and other diseases of the circulatory system: Secondary | ICD-10-CM | POA: Diagnosis not present

## 2023-09-19 DIAGNOSIS — T829XXA Unspecified complication of cardiac and vascular prosthetic device, implant and graft, initial encounter: Principal | ICD-10-CM | POA: Diagnosis present

## 2023-09-19 LAB — COMPREHENSIVE METABOLIC PANEL WITH GFR
ALT: 8 U/L (ref 0–44)
AST: 17 U/L (ref 15–41)
Albumin: 3.7 g/dL (ref 3.5–5.0)
Alkaline Phosphatase: 60 U/L (ref 38–126)
Anion gap: 11 (ref 5–15)
BUN: 36 mg/dL — ABNORMAL HIGH (ref 8–23)
CO2: 17 mmol/L — ABNORMAL LOW (ref 22–32)
Calcium: 8.7 mg/dL — ABNORMAL LOW (ref 8.9–10.3)
Chloride: 110 mmol/L (ref 98–111)
Creatinine, Ser: 2.26 mg/dL — ABNORMAL HIGH (ref 0.44–1.00)
GFR, Estimated: 20 mL/min — ABNORMAL LOW (ref >60)
Glucose, Bld: 131 mg/dL — ABNORMAL HIGH (ref 70–99)
Potassium: 4.5 mmol/L (ref 3.5–5.1)
Sodium: 138 mmol/L (ref 135–145)
Total Bilirubin: 0.5 mg/dL (ref 0.0–1.2)
Total Protein: 7.2 g/dL (ref 6.5–8.1)

## 2023-09-19 LAB — CBC WITH DIFFERENTIAL/PLATELET
Abs Immature Granulocytes: 0.03 K/uL (ref 0.00–0.07)
Basophils Absolute: 0 K/uL (ref 0.0–0.1)
Basophils Relative: 0 %
Eosinophils Absolute: 0.2 K/uL (ref 0.0–0.5)
Eosinophils Relative: 3 %
HCT: 40.6 % (ref 36.0–46.0)
Hemoglobin: 12.5 g/dL (ref 12.0–15.0)
Immature Granulocytes: 0 %
Lymphocytes Relative: 29 %
Lymphs Abs: 2.3 K/uL (ref 0.7–4.0)
MCH: 29.6 pg (ref 26.0–34.0)
MCHC: 30.8 g/dL (ref 30.0–36.0)
MCV: 96.2 fL (ref 80.0–100.0)
Monocytes Absolute: 0.8 K/uL (ref 0.1–1.0)
Monocytes Relative: 10 %
Neutro Abs: 4.6 K/uL (ref 1.7–7.7)
Neutrophils Relative %: 58 %
Platelets: 247 K/uL (ref 150–400)
RBC: 4.22 MIL/uL (ref 3.87–5.11)
RDW: 13.4 % (ref 11.5–15.5)
WBC: 8 K/uL (ref 4.0–10.5)
nRBC: 0 % (ref 0.0–0.2)

## 2023-09-19 MED ORDER — LEVOTHYROXINE SODIUM 50 MCG PO TABS
50.0000 ug | ORAL_TABLET | Freq: Every day | ORAL | Status: DC
  Administered 2023-09-20 – 2023-09-21 (×2): 50 ug via ORAL
  Filled 2023-09-19 (×2): qty 1

## 2023-09-19 MED ORDER — ROSUVASTATIN CALCIUM 5 MG PO TABS
10.0000 mg | ORAL_TABLET | Freq: Every day | ORAL | Status: DC
  Filled 2023-09-19: qty 2

## 2023-09-19 MED ORDER — BRIMONIDINE TARTRATE 0.15 % OP SOLN
1.0000 [drp] | Freq: Two times a day (BID) | OPHTHALMIC | Status: DC
Start: 2023-09-19 — End: 2023-09-21
  Filled 2023-09-19 (×2): qty 5

## 2023-09-19 MED ORDER — LATANOPROST 0.005 % OP SOLN
1.0000 [drp] | Freq: Every day | OPHTHALMIC | Status: DC
Start: 2023-09-19 — End: 2023-09-21
  Filled 2023-09-19: qty 2.5

## 2023-09-19 MED ORDER — SACUBITRIL-VALSARTAN 24-26 MG PO TABS
1.0000 | ORAL_TABLET | Freq: Two times a day (BID) | ORAL | Status: DC
  Filled 2023-09-19: qty 1

## 2023-09-19 MED ORDER — SALONPAS PAIN RELIEF PATCH EX PTCH: 1.0000 | MEDICATED_PATCH | Freq: Every day | CUTANEOUS | Status: DC | PRN

## 2023-09-19 MED ORDER — ACETAMINOPHEN 325 MG PO TABS: 325.0000 mg | ORAL_TABLET | ORAL | Status: DC | PRN

## 2023-09-19 MED ORDER — POTASSIUM CHLORIDE CRYS ER 20 MEQ PO TBCR
20.0000 meq | EXTENDED_RELEASE_TABLET | Freq: Every day | ORAL | Status: DC
  Filled 2023-09-19 (×2): qty 1

## 2023-09-19 NOTE — Patient Instructions (Signed)
 Medication Instructions:  Your physician recommends that you continue on your current medications as directed. Please refer to the Current Medication list given to you today.  *If you need a refill on your cardiac medications before your next appointment, please call your pharmacy*  Lab Work: None ordered.  If you have labs (blood work) drawn today and your tests are completely normal, you will receive your results only by: MyChart Message (if you have MyChart) OR A paper copy in the mail If you have any lab test that is abnormal or we need to change your treatment, we will call you to review the results.  Testing/Procedures: None ordered.   Follow-Up: At [facility], you and your health needs are our priority.  As part of our continuing mission to provide you with exceptional heart care, our providers are all part of one team.  This team includes your primary Cardiologist (physician) and Advanced Practice Providers or APPs (Physician Assistants and Nurse Practitioners) who all work together to provide you with the care you need, when you need it.  Your next appointment:   Dr Nancey recommends you go to [facility] now for direct admission for pacemaker change out tomorrow.

## 2023-09-19 NOTE — H&P (Signed)
 ELECTROPHYSIOLOGY CONSULT NOTE    Patient ID: Melinda Gonzales MRN: [id number], DOB/AGE: 02-Apr-1934 89 y.o.  Admit date: 09/19/2023 Date of Consult: 09/19/2023  Primary Physician: Signa Dire, MD (Inactive) Primary Cardiologist: Peter Swaziland, MD  Electrophysiologist: Dr. Waddell   Reason for admission: Pacemaker Lead Macro-Dislodgement   Patient Profile: Melinda Gonzales is a 89 y.o. female with a history of atrial fibrillation, hypertrophic cardiomyopathy status post septal myectomy, Abbott dual-chamber pacemaker with complete heart block who is being seen admitted with pacemaker lead dislodgement and plans for leadless PPM at the request of Dr. Nancey.  HPI:  Melinda Gonzales is a 89 y.o. female with medical history as above.   She has complete heart block and is pacemaker dependent. Her history of twiddling has led to pacemaker lead complications, including entanglement and migration into the pocket, necessitating a lead revision in May 2023. An AV node ablation was performed in April 2023 due to atrial fibrillation, and the original pacemaker was implanted on May 10, 2021. She also underwent mitral valve replacement.   She was referred to Dr. Nancey by Dr. Waddell for consideration of Leadless PPM.  She was seen today and with worsening thresholds was sent for urgent leadless PPM, tentatively planned for 8/21.  Labs Pending at time of note.   Past Medical History:  Diagnosis Date   Chronic diastolic CHF (congestive heart failure) (HCC) 07/06/2015   Dyspnea    Heart murmur    Hypercholesterolemia    Hypertrophic obstructive cardiomyopathy (HOCM) (HCC)    LVH (left ventricular hypertrophy)    WITH NORMAL SYSTOLIC FUNCTION   Pacemaker lead failure 06/25/2021   SOB (shortness of breath)      Surgical History:  Past Surgical History:  Procedure Laterality Date   AV NODE ABLATION N/A 05/10/2021   Procedure: AV NODE ABLATION;  Surgeon: Waddell Danelle ORN, MD;  Location: [facility]  INVASIVE CV LAB;  Service: Cardiovascular;  Laterality: N/A;   CARDIAC CATHETERIZATION N/A 07/06/2015   Procedure: Right/Left Heart Cath and Coronary Angiography;  Surgeon: Peter M Swaziland, MD;  Location: [facility] INVASIVE CV LAB;  Service: Cardiovascular;  Laterality: N/A;   CARDIOVERSION N/A 10/23/2018   Procedure: CARDIOVERSION;  Surgeon: Kate Lonni CROME, MD;  Location: [facility] ENDOSCOPY;  Service: Endoscopy;  Laterality: N/A;   CARDIOVERSION N/A 03/29/2021   Procedure: CARDIOVERSION;  Surgeon: Francyne Headland, MD;  Location: [facility] ENDOSCOPY;  Service: Cardiovascular;  Laterality: N/A;   LEAD REVISION/REPAIR N/A 06/24/2021   Procedure: LEAD REVISION/REPAIR;  Surgeon: Waddell Danelle ORN, MD;  Location: [facility] INVASIVE CV LAB;  Service: Cardiovascular;  Laterality: N/A;   MITRAL VALVE REPLACEMENT N/A 11/15/2015   ligation of left atrial appendage, maze procedure at [facility] IMPLANT N/A 05/10/2021   Procedure: PACEMAKER IMPLANT;  Surgeon: Waddell Danelle ORN, MD;  [facility] INVASIVE CV LAB;  Service: Cardiovascular;  Laterality: N/A;     Medications Prior to Admission  Medication Sig Dispense Refill Last Dose/Taking   acetaminophen  (TYLENOL ) 325 MG tablet Take 1-2 tablets (325-650 mg total) by mouth every 4 (four) hours as needed for mild pain.      brimonidine  (ALPHAGAN ) 0.15 % ophthalmic solution Place 1 drop into both eyes 2 (two) times daily.      carvedilol  (COREG ) 6.25 MG tablet Take 1 tablet (6.25 mg total) by mouth 2 (two) times daily. 180 tablet 3    empagliflozin  (JARDIANCE ) 10 MG TABS tablet Take 1 tablet (10 mg total) by mouth daily before breakfast. 90 tablet  3    furosemide  (LASIX ) 40 MG tablet Take 1 tablet twice daily on Monday, Wednesday, and Friday. Other days take once daily. 90 tablet 3    latanoprost  (XALATAN ) 0.005 % ophthalmic solution Place 1 drop into both eyes at bedtime.      levothyroxine  (SYNTHROID ) 50 MCG tablet Take 50 mcg by mouth daily before breakfast.       Menthol-Methyl Salicylate  (SALONPAS  PAIN RELIEF  PATCH) PTCH Apply 1 packet topically daily as needed (pain).      potassium chloride  SA (KLOR-CON  M) 20 MEQ tablet Take 1 tablet (20 mEq total) by mouth daily. 90 tablet 3    rosuvastatin  (CRESTOR ) 10 MG tablet Take 1 tablet (10 mg total) by mouth daily. 90 tablet 3    sacubitril -valsartan  (ENTRESTO ) 24-26 MG Take 1 tablet by mouth 2 (two) times daily. 180 tablet 3    XARELTO  15 MG TABS tablet TAKE 1 TABLET BY MOUTH DAILY WITH SUPPER 30 tablet 5     Inpatient Medications:   Allergies:  Allergies  Allergen Reactions   Oxycodone Nausea And Vomiting and Other (See Comments)    And the sweats  And the sweats    And the sweats And the sweats   Statins     Myalgias tolerates crestor     Family History  Problem Relation Age of Onset   Heart failure Mother 48   Heart attack Father 40     Physical Exam: VS:  BP 110/62 (BP Location: Left Arm, Patient Position: Sitting, Cuff Size: Large)   Pulse 62   Ht 5' 2 (1.575 m)   Wt 176 lb (79.8 kg)   SpO2 95%   BMI 32.19 kg/m     GEN- NAD, A&O x 3, normal affect HEENT: Normocephalic, atraumatic Lungs- CTAB, Normal effort.  Heart- Regular rate and rhythm, No M/G/R.  GI- Soft, NT, ND.  Extremities- No clubbing, cyanosis, or edema   Radiology/Studies: DG Chest 2 View Result Date: 09/12/2023 CLINICAL DATA:  Short of breath. EXAM: CHEST - 2 VIEW COMPARISON:  06/25/2021. FINDINGS: Cardiac silhouette is normal in size. Left anterior chest wall dual lead pacemaker, shorter lead tip projecting near the caval atrial junction, distal lead tip in the right ventricle. Previous cardiac surgery changes are stable. No mediastinal or hilar masses.  No evidence of adenopathy. Lungs are clear.  No pleural effusion or pneumothorax. Skeletal structures are grossly intact. IMPRESSION: 1. No acute cardiopulmonary disease. Electronically Signed   By: Alm Parkins M.D.   On: 09/12/2023 15:19   CUP PACEART INCLINIC  DEVICE CHECK Result Date: 09/05/2023 Normal in-clinic dual chamber pacemaker check. Presenting Rhythm: AP/VP- 63 . Routine testing of thresholds, sensing, and impedance demonstrate stable parameters. Patient seen for noise on RV lead- not reproducible with isometrics. Increased atrial sensitivity to 4 mV and RV sensitivity to 8 mV to cover up potential noise. Estimated longevity 2.3-2.4 years. Pt enrolled in remote follow-up.Powell Level, BSN, RN   ZXH:unijb shows AV dual paced rhythm at 62 bpm (personally reviewed)  TELEMETRY: pending connection (personally reviewed)  DEVICE HISTORY: Dual Chamber Abbott PPM implanted 04/2021 for CHB with lead revision 05/2021  Assessment/Plan:  Complete heart block s/p Abbot PPM CXR demonstrates severe retraction of the lead due to twiddler's syndrome Ventricular capture threshold has increased to 3.125 at 1 since last week  Pt is at high risk for sudden loss of capture, which could be life-threatening given that she is pacemaker dependent   We discussed the indication and logistics of the  leadless pacemaker placement.  Have explained risks including device dislodgment, bleeding, RV perforation, and a small but non-zero risk of death.   Atrial fibrillation Status post AVJ ablation Hold Xarelto  for procedures   Chronic diastolic failure Hold Jardiance  - She took recently but as above she is at high risk of complete PPM failure.     For questions or updates, please contact [facility] Please consult [email or web address] for contact info under     Signed, Ozell Prentice Passey, PA-C  09/19/2023, 3:55 PM

## 2023-09-19 NOTE — Progress Notes (Signed)
 Electrophysiology Office Note:    Date:  09/19/2023   ID:  Jadah, Bobak Jun 21, 1934, MRN [id number]  PCP:  Signa Dire, MD (Inactive)   [facility] Providers Cardiologist:  Peter Swaziland, MD     Referring MD: No ref. provider found   History of Present Illness:    Melinda Gonzales is a 89 y.o. female with a medical history significant for atrial fibrillation, hypertrophic cardiomyopathy status post septal myectomy, Abbott dual-chamber pacemaker with complete heart block referred for placement of a leadless pacemaker.     Discussed the use of AI scribe software for clinical note transcription with the patient, who gave verbal consent to proceed.  History of Present Illness Melinda Gonzales is an 89 year old female with hypertrophic cardiomyopathy status post septal myectomy, atrial fibrillation status post AV node ablation, complete heart block and pacemaker dependence who presents to discuss placement of a leadless pacemaker. She was referred by Doctor Waddell for placement of a leadless pacemaker.  She has complete heart block and is pacemaker dependent. Her history of twiddling has led to pacemaker lead complications, including entanglement and migration into the pocket, necessitating a lead revision in May 2023. An AV node ablation was performed in April 2023 due to atrial fibrillation, and the original pacemaker was implanted on May 10, 2021. She also underwent mitral valve replacement.         Today, she is at baseline and has no acute complaints.  EKGs/Labs/Other Studies Reviewed Today:     Echocardiogram:  TTE February 20, 2022 LVEF 55 to 60%.  Left atrial size is mildly dilated.  27 mm bioprosthetic mitral valve    EKG:   EKG Interpretation Date/Time:  Wednesday September 19 2023 14:30:32 EDT Ventricular Rate:  62 PR Interval:  176 QRS Duration:  172 QT Interval:  510 QTC Calculation: 517 R Axis:   138  Text Interpretation: AV dual-paced  rhythm When compared with ECG of 05-Sep-2023 15:35, No significant change was found Confirmed by Nancey Scotts [phone]) on 09/19/2023 3:03:37 PM     Physical Exam:    VS:  BP 110/62 (BP Location: Left Arm, Patient Position: Sitting, Cuff Size: Large)   Pulse 62   Ht 5' 2 (1.575 m)   Wt 176 lb (79.8 kg)   SpO2 95%   BMI 32.19 kg/m     Wt Readings from Last 3 Encounters:  09/19/23 186 lb 15.2 oz (84.8 kg)  09/19/23 176 lb (79.8 kg)  09/05/23 187 lb (84.8 kg)     GEN: Well nourished, well developed in no acute distress CARDIAC: RRR, no murmurs, rubs, gallops RESPIRATORY:  Normal work of breathing MUSCULOSKELETAL: no edema    ASSESSMENT & PLAN:     Complete heart block Abbott dual-chamber pacemaker in place with severe retraction of the lead due to twiddler's syndrome Aspect her device is rotated several times due to her pulling herself up in bed Ventricular capture threshold has increased to 3.125 at 1 since last week Chest x-ray shows the RV lead is essentially straight and pulled back almost to the right atrium from the more apical position of the postprocedure implant x-ray I think she is at high risk for sudden loss of capture, which could be life-threatening given that she is pacemaker dependent I am going to admit her directly with a plan for placement of a leadless pacemaker tomorrow She will need anesthesia for the device placement. Will plan to place a Micra AV.  We discussed  the indication and logistics of the leadless pacemaker placement.  I explained risks including device dislodgment, bleeding, RV perforation, and a small but nonzero risk of death.  Atrial fibrillation Status post AVJ ablation Hold Xarelto   Chronic diastolic failure Hold Jardiance      Signed, Eulas FORBES Furbish, MD  09/19/2023 5:30 PM    [facility]

## 2023-09-19 NOTE — Progress Notes (Signed)
 Orthopedic Tech Progress Note Patient Details:  Melinda Gonzales 08-12-1934 [id number]  Ortho Devices Type of Ortho Device: Arm sling Ortho Device/Splint Location: LUE Ortho Device/Splint Interventions: Ordered, Other (comment) Dropped off to bedside with husband in room   Post Interventions Patient Tolerated: Well Instructions Provided: Care of device  Delanna LITTIE Pac 09/19/2023, 6:21 PM

## 2023-09-20 ENCOUNTER — Inpatient Hospital Stay (HOSPITAL_COMMUNITY): Admitting: Anesthesiology

## 2023-09-20 ENCOUNTER — Inpatient Hospital Stay (HOSPITAL_COMMUNITY)
Admission: RE | Disposition: A | Discharge status: Home / Self Care | Payer: Self-pay | Source: Ambulatory Visit | Attending: Cardiovascular Disease

## 2023-09-20 ENCOUNTER — Ambulatory Visit (HOSPITAL_COMMUNITY): Admission: RE | Admit: 2023-09-20 | Source: Home / Self Care | Admitting: Cardiovascular Disease

## 2023-09-20 DIAGNOSIS — I5032 Chronic diastolic (congestive) heart failure: Secondary | ICD-10-CM

## 2023-09-20 DIAGNOSIS — I442 Atrioventricular block, complete: Secondary | ICD-10-CM | POA: Diagnosis not present

## 2023-09-20 DIAGNOSIS — I4891 Unspecified atrial fibrillation: Secondary | ICD-10-CM

## 2023-09-20 DIAGNOSIS — Z006 Encounter for examination for normal comparison and control in clinical research program: Secondary | ICD-10-CM

## 2023-09-20 DIAGNOSIS — E7800 Pure hypercholesterolemia, unspecified: Secondary | ICD-10-CM

## 2023-09-20 HISTORY — PX: PACEMAKER LEADLESS INSERTION: EP1219

## 2023-09-20 LAB — TYPE AND SCREEN
ABO/RH(D): O POS
Antibody Screen: NEGATIVE

## 2023-09-20 LAB — BASIC METABOLIC PANEL WITH GFR
Anion gap: 12 (ref 5–15)
BUN: 37 mg/dL — ABNORMAL HIGH (ref 8–23)
CO2: 21 mmol/L — ABNORMAL LOW (ref 22–32)
Calcium: 8.8 mg/dL — ABNORMAL LOW (ref 8.9–10.3)
Chloride: 106 mmol/L (ref 98–111)
Creatinine, Ser: 2.27 mg/dL — ABNORMAL HIGH (ref 0.44–1.00)
GFR, Estimated: 20 mL/min — ABNORMAL LOW (ref >60)
Glucose, Bld: 110 mg/dL — ABNORMAL HIGH (ref 70–99)
Potassium: 4.1 mmol/L (ref 3.5–5.1)
Sodium: 139 mmol/L (ref 135–145)

## 2023-09-20 LAB — ABO/RH: ABO/RH(D): O POS

## 2023-09-20 MED ORDER — PROPOFOL 10 MG/ML IV BOLUS
INTRAVENOUS | Status: DC | PRN
  Administered 2023-09-20: 100 mg via INTRAVENOUS

## 2023-09-20 MED ORDER — CEFAZOLIN SODIUM-DEXTROSE 2-4 GM/100ML-% IV SOLN
INTRAVENOUS | Status: AC
  Filled 2023-09-20: qty 100

## 2023-09-20 MED ORDER — SODIUM CHLORIDE 0.9% FLUSH: 3.0000 mL | INTRAVENOUS | Status: DC | PRN

## 2023-09-20 MED ORDER — ROSUVASTATIN CALCIUM 5 MG PO TABS
10.0000 mg | ORAL_TABLET | Freq: Every day | ORAL | Status: DC
Start: 2023-09-21 — End: 2023-09-21
  Filled 2023-09-20: qty 2

## 2023-09-20 MED ORDER — FENTANYL CITRATE (PF) 100 MCG/2ML IJ SOLN
INTRAMUSCULAR | Status: AC
  Filled 2023-09-20: qty 2

## 2023-09-20 MED ORDER — SODIUM CHLORIDE 0.9 % IV SOLN: 250.0000 mL | INTRAVENOUS | Status: DC | PRN

## 2023-09-20 MED ORDER — DEXAMETHASONE SODIUM PHOSPHATE 10 MG/ML IJ SOLN
INTRAMUSCULAR | Status: DC | PRN
  Administered 2023-09-20: 5 mg via INTRAVENOUS

## 2023-09-20 MED ORDER — HEPARIN SODIUM (PORCINE) 1000 UNIT/ML IJ SOLN
INTRAMUSCULAR | Status: DC | PRN
  Administered 2023-09-20: 3000 [IU] via INTRAVENOUS

## 2023-09-20 MED ORDER — SODIUM CHLORIDE 0.9% FLUSH
3.0000 mL | Freq: Two times a day (BID) | INTRAVENOUS | Status: DC
  Administered 2023-09-20: 3 mL via INTRAVENOUS

## 2023-09-20 MED ORDER — IOHEXOL 350 MG/ML SOLN
INTRAVENOUS | Status: DC | PRN
  Administered 2023-09-20: 25 mL

## 2023-09-20 MED ORDER — SACUBITRIL-VALSARTAN 24-26 MG PO TABS
1.0000 | ORAL_TABLET | Freq: Two times a day (BID) | ORAL | Status: DC
  Filled 2023-09-20: qty 1

## 2023-09-20 MED ORDER — HEPARIN (PORCINE) IN NACL 1000-0.9 UT/500ML-% IV SOLN
INTRAVENOUS | Status: DC | PRN
Start: 2023-09-20 — End: 2023-09-20
  Administered 2023-09-20 (×3): 500 mL

## 2023-09-20 MED ORDER — LIDOCAINE 2% (20 MG/ML) 5 ML SYRINGE
INTRAMUSCULAR | Status: DC | PRN
  Administered 2023-09-20: 50 mg via INTRAVENOUS

## 2023-09-20 MED ORDER — SUGAMMADEX SODIUM 200 MG/2ML IV SOLN
INTRAVENOUS | Status: DC | PRN
  Administered 2023-09-20: 200 mg via INTRAVENOUS

## 2023-09-20 MED ORDER — PHENYLEPHRINE 80 MCG/ML (10ML) SYRINGE FOR IV PUSH (FOR BLOOD PRESSURE SUPPORT)
PREFILLED_SYRINGE | INTRAVENOUS | Status: DC | PRN
  Administered 2023-09-20 (×2): 160 ug via INTRAVENOUS

## 2023-09-20 MED ORDER — FENTANYL CITRATE (PF) 250 MCG/5ML IJ SOLN
INTRAMUSCULAR | Status: DC | PRN
  Administered 2023-09-20 (×2): 25 ug via INTRAVENOUS

## 2023-09-20 MED ORDER — SODIUM CHLORIDE 0.9 % IV SOLN: INTRAVENOUS | Status: DC

## 2023-09-20 MED ORDER — SODIUM CHLORIDE 0.9% FLUSH
3.0000 mL | Freq: Two times a day (BID) | INTRAVENOUS | Status: DC
  Administered 2023-09-20 (×2): 3 mL via INTRAVENOUS

## 2023-09-20 MED ORDER — ROCURONIUM BROMIDE 10 MG/ML (PF) SYRINGE
PREFILLED_SYRINGE | INTRAVENOUS | Status: DC | PRN
  Administered 2023-09-20: 10 mg via INTRAVENOUS
  Administered 2023-09-20: 50 mg via INTRAVENOUS

## 2023-09-20 MED ORDER — ONDANSETRON HCL 4 MG/2ML IJ SOLN: 4.0000 mg | Freq: Four times a day (QID) | INTRAMUSCULAR | Status: DC | PRN

## 2023-09-20 MED ORDER — PHENYLEPHRINE HCL-NACL 20-0.9 MG/250ML-% IV SOLN
INTRAVENOUS | Status: DC | PRN
  Administered 2023-09-20: 30 ug/min via INTRAVENOUS

## 2023-09-20 MED ORDER — POTASSIUM CHLORIDE CRYS ER 20 MEQ PO TBCR
20.0000 meq | EXTENDED_RELEASE_TABLET | Freq: Every day | ORAL | Status: DC
  Filled 2023-09-20: qty 1

## 2023-09-20 MED ORDER — ONDANSETRON HCL 4 MG/2ML IJ SOLN
INTRAMUSCULAR | Status: DC | PRN
  Administered 2023-09-20: 4 mg via INTRAVENOUS

## 2023-09-20 MED ORDER — CEFAZOLIN SODIUM-DEXTROSE 2-4 GM/100ML-% IV SOLN
2.0000 g | INTRAVENOUS | Status: AC
  Administered 2023-09-20: 2 g via INTRAVENOUS
  Filled 2023-09-20: qty 100

## 2023-09-20 NOTE — Progress Notes (Signed)
 Paged Dr. Nancey to clarify bedrest orders d/t figure 8 stitch placement. MD confirmed the 4hr bedrest ordered will be ok. 3E RN updated.

## 2023-09-20 NOTE — Progress Notes (Signed)
  Patient Name: Melinda Gonzales Date of Encounter: 09/20/2023  Primary Cardiologist: Peter Swaziland, MD Electrophysiologist: None  Interval Summary   The patient is doing well today.  At this time, the patient denies chest pain, shortness of breath, or any new concerns.  Vital Signs    Vitals:   09/19/23 2200 09/19/23 2300 09/20/23 0016 09/20/23 0500  BP:   125/78 (!) 144/62  Pulse:   61 63  Resp: (!) 24 17 20 19   Temp:   97.6 F (36.4 C) 97.6 F (36.4 C)  TempSrc:   Oral Oral  SpO2:   99% 99%  Weight:    83.8 kg  Height:        Intake/Output Summary (Last 24 hours) at 09/20/2023 0726 Last data filed at 09/20/2023 0502 Gross per 24 hour  Intake 150 ml  Output 1400 ml  Net -1250 ml   Filed Weights   09/19/23 1604 09/20/23 0500  Weight: 84.8 kg 83.8 kg    Physical Exam    GEN- NAD, Alert and oriented  Lungs- Clear to ausculation bilaterally, normal work of breathing Cardiac- Regular rate and rhythm, no murmurs, rubs or gallops GI- soft, NT, ND, + BS Extremities- no clubbing or cyanosis. No edema  Telemetry    AV dual paced 60s (personally reviewed)  Hospital Course    Melinda Gonzales is a 89 y.o. female with a history of atrial fibrillation, hypertrophic cardiomyopathy status post septal myectomy, Abbott dual-chamber pacemaker with complete heart block who is being seen admitted with pacemaker lead dislodgement and plans for leadless PPM at the request of Dr. Nancey.   Assessment & Plan    Complete heart block s/p Abbot PPM Pacemaker Lead Dislodgement Plan to place transvenous device in backup mode and implant Leadless PPM today by Dr. Nancey  Explained risks, benefits, and alternatives to Leadless PPM implantation, including but not limited to bleeding, infection, RV perforation, pericardial effusion, device dislodgement, heart attack, stroke, or death.  Pt verbalized understanding and agrees to proceed.    OAC held  Plan for Abbott in the event dual  leadless PPM is needed   Atrial fibrillation Status post AVJ ablation Xarelto  held for procedures   Chronic diastolic failure Jardiance  on hold     For questions or updates, please contact [facility] Please consult [email or web address] for contact info under     Signed, Ozell Prentice Passey, PA-C  09/20/2023, 7:26 AM

## 2023-09-20 NOTE — Plan of Care (Signed)
  Problem: Education: Goal: Knowledge of General Education information will improve Description: Including pain rating scale, medication(s)/side effects and non-pharmacologic comfort measures Outcome: Progressing   Problem: Health Behavior/Discharge Planning: Goal: Ability to manage health-related needs will improve Outcome: Progressing   Problem: Skin Integrity: Goal: Risk for impaired skin integrity will decrease Outcome: Progressing   

## 2023-09-20 NOTE — Progress Notes (Signed)
 Mobility Specialist Progress Note:    09/20/23 0955  Mobility  Activity Ambulated with assistance  Level of Assistance Contact guard assist, steadying assist  Assistive Device Other (Comment) (HHA)  Distance Ambulated (ft) 100 ft  Activity Response Tolerated fair  Mobility Referral Yes  Mobility visit 1 Mobility  Mobility Specialist Start Time (ACUTE ONLY) 0955  Mobility Specialist Stop Time (ACUTE ONLY) 1010  Mobility Specialist Time Calculation (min) (ACUTE ONLY) 15 min   Pt pleasant and agreeable to session. No c/o any symptoms. Pt seemed less confused today and knowing where she is at according to family. Returned pt to bed w/ all needs met.   Venetia Keel Mobility Specialist Please Neurosurgeon or Rehab Office at [phone]

## 2023-09-20 NOTE — Anesthesia Preprocedure Evaluation (Addendum)
 Anesthesia Evaluation  Patient identified by MRN, date of birth, ID band Patient awake    Reviewed: Allergy & Precautions, NPO status , Patient's Chart, lab work & pertinent test results  Airway Mallampati: II  TM Distance: >3 FB Neck ROM: Full    Dental   Pulmonary former smoker   breath sounds clear to auscultation       Cardiovascular +CHF  + dysrhythmias + pacemaker + Valvular Problems/Murmurs  Rhythm:Regular Rate:Normal  IMPRESSIONS     1. History of HOCM s/p septal myoectomy. Left ventricular ejection  fraction, by estimation, is 55 to 60%. The left ventricle has normal  function. The left ventricle has no regional wall motion abnormalities.  Left ventricular diastolic function could not   be evaluated.   2. Right ventricular systolic function is normal. The right ventricular  size is normal. There is normal pulmonary artery systolic pressure. The  estimated right ventricular systolic pressure is 26.4 mmHg.   3. Left atrial size was mildly dilated.   4. The mitral valve has been repaired/replaced. No evidence of mitral  valve regurgitation. The mean mitral valve gradient is 4.0 mmHg with  average heart rate of 69 bpm. There is a 27 mm bioprosthetic valve present  in the mitral position. Procedure Date:   11/15/2015. Echo findings are consistent with normal structure and  function of the mitral valve prosthesis.   5. The aortic valve is tricuspid. Aortic valve regurgitation is trivial.  Aortic valve sclerosis/calcification is present, without any evidence of  aortic stenosis.   6. The inferior vena cava is normal in size with greater than 50%  respiratory variability, suggesting right atrial pressure of 3 mmHg.     Neuro/Psych negative neurological ROS     GI/Hepatic negative GI ROS, Neg liver ROS,,,  Endo/Other  negative endocrine ROS    Renal/GU CRFRenal disease     Musculoskeletal   Abdominal   Peds   Hematology negative hematology ROS (+)   Anesthesia Other Findings   Reproductive/Obstetrics                              Anesthesia Physical Anesthesia Plan  ASA: 3  Anesthesia Plan: General   Post-op Pain Management: Minimal or no pain anticipated   Induction: Intravenous  PONV Risk Score and Plan: 2 and Dexamethasone  and Ondansetron   Airway Management Planned: Oral ETT  Additional Equipment:   Intra-op Plan:   Post-operative Plan: Extubation in OR  Informed Consent: I have reviewed the patients History and Physical, chart, labs and discussed the procedure including the risks, benefits and alternatives for the proposed anesthesia with the patient or authorized representative who has indicated his/her understanding and acceptance.       Plan Discussed with:   Anesthesia Plan Comments:          Anesthesia Quick Evaluation

## 2023-09-20 NOTE — Plan of Care (Signed)
  Problem: Activity: Goal: Ability to return to baseline activity level will improve Outcome: Progressing   

## 2023-09-20 NOTE — Transfer of Care (Signed)
 Immediate Anesthesia Transfer of Care Note  Patient: Melinda Gonzales  Procedure(s) Performed: PACEMAKER LEADLESS INSERTION  Patient Location: Cath Lab  Anesthesia Type:General  Level of Consciousness: awake, alert , and oriented  Airway & Oxygen Therapy: Patient Spontanous Breathing  Post-op Assessment: Report given to RN and Post -op Vital signs reviewed and stable  Post vital signs: Reviewed and stable  Last Vitals:  Vitals Value Taken Time  BP 130/58 09/20/23 17:15  Temp 36.5 C 09/20/23 16:44  Pulse 80 09/20/23 17:17  Resp 12 09/20/23 17:17  SpO2 96 % 09/20/23 17:17  Vitals shown include unfiled device data.  Last Pain:  Vitals:   09/20/23 1644  TempSrc: Oral  PainSc: 0-No pain         Complications: There were no known notable events for this encounter.

## 2023-09-20 NOTE — TOC CM/SW Note (Signed)
 Transition of Care [facility]) - Inpatient Brief Assessment   Patient Details  Name: Melinda Gonzales MRN: [id number] Date of Birth: November 03, 1934  [facility] CM/SW Contact:    Melinda Barnie Rama, RN Phone Number: 09/20/2023, 4:45 PM   Clinical Narrative: From home with spouse, has PCP and insurance on file, states has no HH services in place at this time or DME at home.  States family member will transport them home at [employer] and family is support system, states gets medications from [facility] on [address].  Pta self ambulatory.   There are no ICM needs identified  at this time.  Please place consult for ICM needs.     Transition of Care Asessment: Insurance and Status: Insurance coverage has been reviewed Patient has primary care physician: Yes Home environment has been reviewed: home with spouse Prior level of function:: indep Prior/Current Home Services: No current home services Social Drivers of Health Review: SDOH reviewed no interventions necessary Readmission risk has been reviewed: Yes Transition of care needs: no transition of care needs at this time

## 2023-09-20 NOTE — Anesthesia Procedure Notes (Signed)
 Procedure Name: Intubation Date/Time: 09/20/2023 3:11 PM  Performed by: Hedy Jarred, CRNAPre-anesthesia Checklist: Patient identified, Emergency Drugs available, Suction available and Patient being monitored Patient Re-evaluated:Patient Re-evaluated prior to induction Oxygen Delivery Method: Circle System Utilized Preoxygenation: Pre-oxygenation with 100% oxygen Induction Type: IV induction Ventilation: Mask ventilation without difficulty Laryngoscope Size: Mac and 3 Grade View: Grade I Tube type: Oral Tube size: 7.0 mm Number of attempts: 1 Airway Equipment and Method: Stylet and Oral airway Placement Confirmation: ETT inserted through vocal cords under direct vision, positive ETCO2 and breath sounds checked- equal and bilateral Secured at: 21 cm Tube secured with: Tape Dental Injury: Teeth and Oropharynx as per pre-operative assessment

## 2023-09-21 ENCOUNTER — Inpatient Hospital Stay (HOSPITAL_COMMUNITY)

## 2023-09-21 ENCOUNTER — Encounter (HOSPITAL_COMMUNITY): Payer: Self-pay | Admitting: Cardiovascular Disease

## 2023-09-21 ENCOUNTER — Telehealth: Payer: Self-pay | Admitting: Cardiovascular Disease

## 2023-09-21 ENCOUNTER — Other Ambulatory Visit: Payer: Self-pay | Admitting: Physician Assistant

## 2023-09-21 DIAGNOSIS — N184 Chronic kidney disease, stage 4 (severe): Secondary | ICD-10-CM

## 2023-09-21 NOTE — Telephone Encounter (Signed)
 Spoke with  Melinda Gonzales (Abbott rep).  There is nothing that patient/husband need to do with their new remote monitor until they come for the 2 week post-op visit on 9/4.  Melinda Gonzales plans on having a rep present at that appointment to go over everything regarding the new monitor.   I reminded patient to bring it with them when they come.  Husband verbalizes understanding and thanks us  for the help.

## 2023-09-21 NOTE — Discharge Instructions (Signed)
 After Your Leadless Pacemaker  You have a Abbott Pacemaker  Do not squat, lift over 5 lbs, or have sexual activity for 1 week.   Do not drive for 4 days, or until instructed by your healthcare provider that you are safe to do so.   Monitor your surgical site for redness, swelling, and drainage. Call the device clinic at [phone] if you experience these symptoms or fever/chills.  You may shower 1 day after your pacemaker implant and wash around the site with soap and water. Avoid lotions, ointments, or perfumes over your incision until it is well-healed.  You may use a hot tub or a pool AFTER your wound check appointment if the incision is completely closed.  Your Pacemaker may be MRI compatible. We will discuss this at your first follow up/wound check. .   Remote monitoring is used to monitor your pacemaker from home. This monitoring is scheduled every 91 days by our office. It allows [place] to keep an eye on the functioning of your device to ensure it is working properly. You will routinely see your Electrophysiologist annually (more often if necessary).    Pacemaker Implantation, Care After This sheet gives you information about how to care for yourself after your procedure. Your health care provider may also give you more specific instructions. If you have problems or questions, contact your health care provider. What can I expect after the procedure? After the procedure, it is common to have: Mild pain. Slight bruising. Some swelling over the incision. You should received your Pacemaker ID card within 4-8 weeks. Follow these instructions at home: Medicines Take over-the-counter and prescription medicines only as told by your health care provider. If you were prescribed an antibiotic medicine, take it as told by your health care provider. Do not stop taking the antibiotic even if you start to feel better. Wound care  Check the incision site every day to make sure it is not  infected, bleeding, or starting to pull apart. Do not use lotions or ointments near the incision site unless directed to do so. Keep the incision area clean and dry for 7 days after the procedure or as directed by your health care provider. It takes several weeks for the incision site to completely heal. Do not take baths, swim, or use a hot tub until seen in the device clinic.  Activity Do not drive or use heavy machinery while taking prescription pain medicine. Do not drive for 24 hours if you were given a medicine to help you relax (sedative). Check with your health care provider before you start to drive or play sports. You may go back to work when your health care provider says it is okay. Pacemaker care You may be shown how to transfer data from your pacemaker through the phone to your health care provider. Always let all health care providers know about your pacemaker before you have any medical procedures or tests. Wear a medical ID bracelet or necklace stating that you have a pacemaker. Carry a pacemaker ID card with you at all times. Your pacemaker battery will last for 5-15 years. Routine checks by your health care provider will let the health care provider know when the battery is starting to run down. The pacemaker will need to be replaced when the battery starts to run down. Do not use amateur Proofreader. Other electrical devices are safe to use, including power tools, lawn mowers, and speakers. If you are unsure of whether  something is safe to use, ask your health care provider. When using your cell phone, hold it to the ear opposite the pacemaker. Do not leave your cell phone in a pocket over the pacemaker. Avoid places or objects that have a strong electric or magnetic field, including: Scientist, physiological. When at the airport, let officials know that you have a pacemaker. Power plants. Large electrical generators. Radiofrequency transmission  towers, such as cell phone and radio towers. General instructions Weigh yourself every day. If you suddenly gain weight, fluid may be building up in your body. Keep all follow-up visits as told by your health care provider. This is important. Contact a health care provider if: You gain weight suddenly. Your legs or feet swell. It feels like your heart is fluttering or skipping beats (heart palpitations). You have chills or a fever. You have more redness, swelling, or pain around your incisions. You have more fluid or blood coming from your incisions. Your incisions feel warm to the touch. You have pus or a bad smell coming from your incisions. Get help right away if: You have chest pain. You have trouble breathing or are short of breath. You become extremely tired. You are light-headed or you faint. This information is not intended to replace advice given to you by your health care provider. Make sure you discuss any questions you have with your health care provider.

## 2023-09-21 NOTE — Telephone Encounter (Signed)
 Calling in about the monitor that was given to them, calling to see how often to the transmit suppose to take place. Please advise

## 2023-09-21 NOTE — Discharge Summary (Signed)
 ELECTROPHYSIOLOGY PROCEDURE DISCHARGE SUMMARY    Patient ID: Melinda Gonzales,  MRN: [id number], DOB/AGE: 06/03/1934 89 y.o.  Admit date: 09/19/2023 Discharge date: 09/21/2023  Primary Care Physician: Signa Dire, MD (Inactive)  Primary Cardiologist: Dr. Swaziland Electrophysiologist: Dr. Tayor >> Dr. 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 AKI/CKD (IV) Plan BMET at wound check visit  Allergies  Allergen Reactions   Statins     Myalgias tolerates crestor    Oxycodone Nausea And Vomiting and Other (See Comments)    And the sweats  And the sweats    And the sweats And the sweats     Procedures This Admission:  1.  Implantation of a Abbott leadless single PPM on 09/20/23 by Dr Nancey.   CONCLUSIONS:   Successful placement of a ventricular leadless pacemakers -- Abbott Aveir  3.  No early apparent complications. CXR on 09/21/23 demonstrated no pneumothorax status post device implantation.   Brief HPI: Melinda Gonzales is a 89 y.o. female is followed in the office, noting rising pacing lead threshold, known dependence given her AV node ablation CXR confirmed evidence of leads retracted and coiled leads c/w twiddler's, she was admitted.  Hospital Course:  The patient was admitted and underwent implantation of a leadless single chamber PPM  with details as outlined in the procedure report.  She was monitored on telemetry overnight which demonstrated V pacing (asynchronous).  R groin stitch was removed by Dr. Nancey, site soft, no hematoma, bleeding, discomfort.  The device was interrogated and found to be functioning normally, her transvenous device/generator turned off to avoid cross talk/interference.   Wound care, arm mobility, and restrictions were reviewed with the patient.  The patient  feels well, denies any CP/SOB.  She was examined by Dr. Nancey and considered stable for discharge to home.    Physical Exam: Vitals:   09/21/23 0100 09/21/23 0200 09/21/23 0300 09/21/23 0730  BP:    139/67  Pulse: 80 81 78 74  Resp: (!) 31 16 19 16   Temp:    98.1 F (36.7 C)  TempSrc:    Oral  SpO2: 95% 93% 92% 93%  Weight:      Height:        GEN- The patient is well appearing, alert and oriented x 3 today.   HEENT: normocephalic, atraumatic; sclera clear, conjunctiva pink; hearing intact; oropharynx clear; neck supple, no JVP Lungs-  CTA b/l, normal work of breathing.  No wheezes, rales, rhonchi Heart- RRR, no murmurs, rubs or gallops, PMI not laterally displaced GI- soft, non-tender, non-distended Extremities- no clubbing, cyanosis, or edema MS- no significant deformity or atrophy Skin- warm and dry, no rash or lesion, R groin is soft, nontender, without hematoma/ecchymosis/bleeding Psych- euthymic mood, full affect Neuro- no gross deficits   Labs:   Lab Results  Component Value Date   WBC 8.0 09/19/2023   HGB 12.5 09/19/2023   HCT 40.6 09/19/2023   MCV 96.2 09/19/2023   PLT 247 09/19/2023    Recent Labs  Lab 09/19/23 1753 09/20/23 0256  NA 138 139  K 4.5 4.1  CL 110 106  CO2 17* 21*  BUN 36* 37*  CREATININE 2.26* 2.27*  CALCIUM  8.7* 8.8*  PROT 7.2  --   BILITOT 0.5  --   ALKPHOS 60  --   ALT 8  --  AST 17  --   GLUCOSE 131* 110*    Discharge Medications:  Allergies as of 09/21/2023       Reactions   Statins    Myalgias tolerates crestor    Oxycodone Nausea And Vomiting, Other (See Comments)   And the sweats And the sweats    And the sweats And the sweats        Medication List     TAKE these medications    acetaminophen  325 MG tablet Commonly known as: TYLENOL  Take 1-2 tablets (325-650 mg total) by mouth every 4 (four) hours as needed for mild pain.   brimonidine  0.15 % ophthalmic solution Commonly known as: ALPHAGAN  Place 1 drop  into both eyes 2 (two) times daily.   carvedilol  6.25 MG tablet Commonly known as: COREG  Take 1 tablet (6.25 mg total) by mouth 2 (two) times daily.   empagliflozin  10 MG Tabs tablet Commonly known as: Jardiance  Take 1 tablet (10 mg total) by mouth daily before breakfast.   Entresto  24-26 MG Generic drug: sacubitril -valsartan  Take 1 tablet by mouth 2 (two) times daily.   furosemide  40 MG tablet Commonly known as: LASIX  Take 1 tablet twice daily on Monday, Wednesday, and Friday. Other days take once daily. Notes to patient: Do not take 09/21/23   latanoprost  0.005 % ophthalmic solution Commonly known as: XALATAN  Place 1 drop into both eyes at bedtime.   levothyroxine  75 MCG tablet Commonly known as: SYNTHROID  Take 75 mcg by mouth at bedtime.   potassium chloride  SA 20 MEQ tablet Commonly known as: KLOR-CON  M Take 1 tablet (20 mEq total) by mouth daily.   rosuvastatin  10 MG tablet Commonly known as: CRESTOR  Take 1 tablet (10 mg total) by mouth daily. What changed: when to take this   Salonpas  Pain Relief  Patch Ptch Apply 1 packet topically daily as needed (pain).   Xarelto  15 MG Tabs tablet Generic drug: Rivaroxaban  TAKE 1 TABLET BY MOUTH DAILY WITH SUPPER        Disposition: home Discharge Instructions     Diet - low sodium heart healthy   Complete by: As directed    Increase activity slowly   Complete by: As directed         Duration of Discharge Encounter: 15 minutes, APP time  Bonney Charlies Arthur, PA-C 09/21/2023 10:02 AM

## 2023-09-21 NOTE — TOC Transition Note (Signed)
 Transition of Care [facility]) - Discharge Note   Patient Details  Name: Melinda Gonzales MRN: [id number] Date of Birth: 1934/12/08  [facility]) CM/SW Contact:  [name], RN Phone Number: 09/21/2023, 10:47 AM   Clinical Narrative:    For dc today, has transport .  Has no needs.         Patient Goals and CMS Choice            Discharge Placement                       Discharge Plan and Services Additional resources added to the After Visit Summary for                                       Social Drivers of Health (SDOH) Interventions SDOH Screenings   Food Insecurity: No Food Insecurity (09/19/2023)  Housing: Low Risk  (09/19/2023)  Transportation Needs: No Transportation Needs (09/19/2023)  Utilities: Not At Risk (09/19/2023)  Social Connections: Unknown (09/19/2023)  Tobacco Use: Medium Risk (09/19/2023)     Readmission Risk Interventions    09/20/2023    4:44 PM  Readmission Risk Prevention Plan  Post Dischage Appt Complete  Medication Screening Complete  Transportation Screening Complete

## 2023-09-21 NOTE — Telephone Encounter (Signed)
 Patient has a new leadless PPM ([employer]ott) and has their brand new transmitter.  Husband is unsure how the remote monitor operates even though he was uanble

## 2023-09-24 MED FILL — Fentanyl Citrate Preservative Free (PF) Inj 100 MCG/2ML: INTRAMUSCULAR | Qty: 1 | Status: AC

## 2023-09-24 NOTE — Anesthesia Postprocedure Evaluation (Signed)
 Anesthesia Post Note  Patient: Melinda Gonzales  Procedure(s) Performed: PACEMAKER LEADLESS INSERTION     Patient location during evaluation: Cath Lab Anesthesia Type: General Level of consciousness: awake and alert Pain management: pain level controlled Vital Signs Assessment: post-procedure vital signs reviewed and stable Respiratory status: spontaneous breathing, nonlabored ventilation, respiratory function stable and patient connected to nasal cannula oxygen Cardiovascular status: blood pressure returned to baseline and stable Postop Assessment: no apparent nausea or vomiting Anesthetic complications: no   There were no known notable events for this encounter.  Last Vitals:  Vitals:   09/21/23 0300 09/21/23 0730  BP:  139/67  Pulse: 78 74  Resp: 19 16  Temp:  36.7 C  SpO2: 92% 93%    Last Pain:  Vitals:   09/21/23 0730  TempSrc: Oral  PainSc: 0-No pain                 Contessa Preuss S

## 2023-09-26 ENCOUNTER — Telehealth: Payer: Self-pay

## 2023-09-26 DIAGNOSIS — E7800 Pure hypercholesterolemia, unspecified: Secondary | ICD-10-CM | POA: Diagnosis not present

## 2023-09-26 DIAGNOSIS — R031 Nonspecific low blood-pressure reading: Secondary | ICD-10-CM | POA: Diagnosis not present

## 2023-09-26 DIAGNOSIS — I442 Atrioventricular block, complete: Secondary | ICD-10-CM | POA: Diagnosis not present

## 2023-09-26 DIAGNOSIS — E1122 Type 2 diabetes mellitus with diabetic chronic kidney disease: Secondary | ICD-10-CM | POA: Diagnosis not present

## 2023-09-26 DIAGNOSIS — T829XXD Unspecified complication of cardiac and vascular prosthetic device, implant and graft, subsequent encounter: Secondary | ICD-10-CM | POA: Diagnosis not present

## 2023-09-26 DIAGNOSIS — I421 Obstructive hypertrophic cardiomyopathy: Secondary | ICD-10-CM | POA: Diagnosis not present

## 2023-09-26 DIAGNOSIS — I4891 Unspecified atrial fibrillation: Secondary | ICD-10-CM | POA: Diagnosis not present

## 2023-09-26 DIAGNOSIS — E039 Hypothyroidism, unspecified: Secondary | ICD-10-CM | POA: Diagnosis not present

## 2023-09-26 DIAGNOSIS — N184 Chronic kidney disease, stage 4 (severe): Secondary | ICD-10-CM | POA: Diagnosis not present

## 2023-09-26 DIAGNOSIS — T148XXA Other injury of unspecified body region, initial encounter: Secondary | ICD-10-CM | POA: Diagnosis not present

## 2023-09-26 DIAGNOSIS — Y712 Prosthetic and other implants, materials and accessory cardiovascular devices associated with adverse incidents: Secondary | ICD-10-CM | POA: Diagnosis not present

## 2023-09-26 DIAGNOSIS — N179 Acute kidney failure, unspecified: Secondary | ICD-10-CM | POA: Diagnosis not present

## 2023-09-26 LAB — LAB REPORT - SCANNED: EGFR: 25

## 2023-09-26 NOTE — Progress Notes (Signed)
 Complex Care Management   09/26/2023  Name: ELVENA OYER  MRN: [id number]  DOB: 1934-03-26  Inbound call received from Leita JAYSON Erichsen after receiving an Interactive Voice Response (IVR) call after a recent hospitalization. Information was provided about Care Management services.  Follow up plan: No further follow up required   Leita Lyme, BLANCH CAULK [facility]  [facility], [facility] Care Management Assistant [phone]

## 2023-10-04 ENCOUNTER — Ambulatory Visit: Attending: Cardiology

## 2023-10-04 ENCOUNTER — Ambulatory Visit: Payer: Self-pay | Admitting: Cardiovascular Disease

## 2023-10-04 DIAGNOSIS — I5032 Chronic diastolic (congestive) heart failure: Secondary | ICD-10-CM | POA: Diagnosis not present

## 2023-10-04 LAB — CUP PACEART INCLINIC DEVICE CHECK
Date Time Interrogation Session: 20250904164441
Implantable Pulse Generator Implant Date: 20250821
Pulse Gen Serial Number: 1364210

## 2023-10-04 NOTE — Patient Instructions (Addendum)
 Follow up as scheduled.

## 2023-10-04 NOTE — Progress Notes (Signed)
 Leadless pacemaker check in clinic. Please see scanned/attached report.  Presenting rhythm: VP 75. Longevity 2.5 years. VS 0%, VP >99%.   Implanted for Irreversible symptomatic bradycardia due to complete heart block.

## 2023-10-11 ENCOUNTER — Encounter (INDEPENDENT_AMBULATORY_CARE_PROVIDER_SITE_OTHER): Admitting: Ophthalmology

## 2023-10-22 ENCOUNTER — Other Ambulatory Visit: Payer: Self-pay | Admitting: Cardiology

## 2023-10-22 NOTE — Telephone Encounter (Signed)
 Prescription refill request for Xarelto  received.  Indication:afib Last office visit:8/25 Weight:83.8  kg Age:89 Scr:2.27  8/25 CrCl:23.1  ml/min  Prescription refilled

## 2023-10-31 DIAGNOSIS — Z23 Encounter for immunization: Secondary | ICD-10-CM | POA: Diagnosis not present

## 2023-11-06 ENCOUNTER — Ambulatory Visit: Payer: Medicare Other

## 2023-11-06 DIAGNOSIS — I442 Atrioventricular block, complete: Secondary | ICD-10-CM

## 2023-11-09 ENCOUNTER — Telehealth: Payer: Self-pay | Admitting: Cardiology

## 2023-11-09 LAB — CUP PACEART REMOTE DEVICE CHECK
Battery Remaining Longevity: 29 mo
Battery Voltage: 3 V
Brady Statistic RV Percent Paced: 100 %
Date Time Interrogation Session: 20251010102248
Implantable Pulse Generator Implant Date: 20250821
Lead Channel Pacing Threshold Amplitude: 1.5 V
Lead Channel Pacing Threshold Pulse Width: 0.5 ms
Lead Channel Setting Pacing Amplitude: 4 V
Lead Channel Setting Pacing Pulse Width: 0.5 ms
Lead Channel Setting Sensing Sensitivity: 4 mV
Pulse Gen Serial Number: 1364210

## 2023-11-09 NOTE — Progress Notes (Signed)
 Remote PPM Transmission

## 2023-11-09 NOTE — Telephone Encounter (Signed)
 Message sent to Device Team.

## 2023-11-09 NOTE — Telephone Encounter (Signed)
 Called patient back to let them know that we successfully received their remote transmission and there are no alerts on it  All questions and concerns addressed at this time  Patient very appreciative of call back

## 2023-11-09 NOTE — Telephone Encounter (Signed)
 Pt spouse called in to see if we received a telemetry. Please Advise

## 2023-11-12 ENCOUNTER — Ambulatory Visit: Payer: Self-pay | Admitting: Cardiovascular Disease

## 2023-11-19 NOTE — Progress Notes (Signed)
 Melinda Gonzales Date of Birth: 03/26/1934   History of Present Illness: [name] is seen for follow up of atrial flutter and CHF  She was evaluated in 2011 with a Myoview stress test which was normal. She had an Echo at that time that showed moderate LVH with EF 65-70%. Mild MR. She does have a history of murmur. She is not a smoker. In early 2016 she presented with increased dyspnea and an Echo and cardiac MRI were done. These with both consistent with HOCM. LVOT gradient of 112 mm Hg at rest. She was started on low dose lasix  and Toprol  XL with good response initially with improved dyspnea.  Seen last year with symptoms of increased dyspnea. No increased edema or orthopnea but more dyspnea with walking or lifting. No chest pain. No dizziness or palpitations. Energy level has decreased. Repeat Echo showed severe LVOT gradient and MR.  She underwent cardiac cath with findings of LVOT gradient and MR. No significant CAD. Pulmonary HTN. She was referred to the [facility] clinic and underwent surgery on 11/05/15. Prior to surgery she developed Afib and had a DCCV. Surgery  included a septal myectomy, MVR with a #29 mm St. Jude  Biocor valve, left sided Cryo MAZE, and ligation of the left atrial appendage. Her post op course was remarkable for paroxysmal atrial fibrillation and she was started on amiodarone  and Xarelto .  Ecg showed a LBBB. Repeat Echo on 11/11/15 showed normal LV function with EF 60%. Mean MV gradient 8 mm Hg. Trace MR. Mild TR. There was a fixed LVOT gradient of 32 mm Hg peak and mean of 16 mm Hg. Repeat Echo 02/03/16 showed no LVOT gradient and normal MV prosthesis function. There was some concern that amiodarone  caused her to have an optic nerve problem with vision loss - this was discontinued.   She was seen in September 2020 with symptoms of persistent tachycardia and SOB. HR in the 120s. Echo was obtained and showed significant decline in LV function with EF 25-30%. After review  of Ecg tracings and reviewing with EP it appeared she was in Atrial flutter. BNP was 467. TSH was mildly elevated 6.0. She was started on Coreg . Lasix  was increased she was on Xarelto . She underwent  DCCV on 10/23/18. Subsequent Echo in December showed EF had returned to normal.   When seen in February she was back in Atrial flutter- atypical. Once again this was associated with significant drop in EF to 30-35%. Mitral prosthesis was functioning well. Mild to moderate AS. She underwent successful DCCV on 03/29/21. Was seen by Dr Waddell afterwards on 04/01/21 and was in NSR. He felt there were limited options for AAD therapy given intolerance to amiodarone . Did not feel she was a good candidate for ablation at her age and comorbidities. Consideration of AV node ablation and BiV pacer if flutter recurs or if EF does not recover.   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 Last pacer check in October was functioning well with Afib burden of 16%. Repeat Echo in Jan showed normalization of EF with no outflow gradient. MV prosthesis functioning normally. Moderate pulmonary HTN.   Was seen in August with rising pacer thresholds. 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 Underwent placement of leadless ventricular pacemaker.   On follow up today she is seen with her husband. She notes that she feels well. She is sedentary. Denies any dizziness, palpitations, chest pain or SOB.  No increase in edema.    Current Outpatient Medications on File Prior to Visit  Medication Sig Dispense Refill   acetaminophen  (TYLENOL ) 325 MG tablet Take 1-2 tablets (325-650 mg total) by mouth every 4 (four) hours as needed for mild pain.     brimonidine  (ALPHAGAN ) 0.15 % ophthalmic solution Place 1 drop into both eyes 2 (two) times daily.     carvedilol  (COREG ) 6.25 MG tablet Take 1 tablet (6.25 mg total) by mouth 2 (two) times daily. 180 tablet 3   empagliflozin  (JARDIANCE )  10 MG TABS tablet Take 1 tablet (10 mg total) by mouth daily before breakfast. 90 tablet 3   furosemide  (LASIX ) 40 MG tablet Take 1 tablet twice daily on Monday, Wednesday, and Friday. Other days take once daily. 90 tablet 3   latanoprost  (XALATAN ) 0.005 % ophthalmic solution Place 1 drop into both eyes at bedtime.     levothyroxine  (SYNTHROID ) 75 MCG tablet Take 75 mcg by mouth at bedtime.     Menthol-Methyl Salicylate  (SALONPAS  PAIN RELIEF  PATCH) PTCH Apply 1 packet topically daily as needed (pain).     potassium chloride  SA (KLOR-CON  M) 20 MEQ tablet Take 1 tablet (20 mEq total) by mouth daily. 90 tablet 3   rosuvastatin  (CRESTOR ) 10 MG tablet Take 1 tablet (10 mg total) by mouth daily. 90 tablet 3   sacubitril -valsartan  (ENTRESTO ) 24-26 MG Take 1 tablet by mouth 2 (two) times daily. 180 tablet 3   XARELTO  15 MG TABS tablet TAKE 1 TABLET BY MOUTH DAILY WITH SUPPER 30 tablet 5   No current facility-administered medications on file prior to visit.    Allergies  Allergen Reactions   Statins     Myalgias tolerates crestor    Oxycodone Nausea And Vomiting and Other (See Comments)    And the sweats  And the sweats    And the sweats And the sweats    Past Medical History:  Diagnosis Date   Chronic diastolic CHF (congestive heart failure) (HCC) 07/06/2015   Dyspnea    Heart murmur    Hypercholesterolemia    Hypertrophic obstructive cardiomyopathy (HOCM) (HCC)    LVH (left ventricular hypertrophy)    WITH NORMAL SYSTOLIC FUNCTION   Pacemaker lead failure 06/25/2021   SOB (shortness of breath)     Past Surgical History:  Procedure Laterality Date   AV NODE ABLATION N/A 05/10/2021   Procedure: AV NODE ABLATION;  Surgeon: Waddell Danelle ORN, MD;  Location: [facility] INVASIVE CV LAB;  Service: Cardiovascular;  Laterality: N/A;   CARDIAC CATHETERIZATION N/A 07/06/2015   Procedure: Right/Left Heart Cath and Coronary Angiography;  Surgeon: Crystalmarie Yasin M Amoy Steeves, MD;  Location: [facility] INVASIVE CV LAB;  Service:  Cardiovascular;  Laterality: N/A;   CARDIOVERSION N/A 10/23/2018   Procedure: CARDIOVERSION;  Surgeon: Kate Lonni CROME, MD;  Location: [facility] ENDOSCOPY;  Service: Endoscopy;  Laterality: N/A;   CARDIOVERSION N/A 03/29/2021   Procedure: CARDIOVERSION;  Surgeon: Francyne Headland, MD;  Location: [facility] ENDOSCOPY;  Service: Cardiovascular;  Laterality: N/A;   LEAD REVISION/REPAIR N/A 06/24/2021   Procedure: LEAD REVISION/REPAIR;  Surgeon: Waddell Danelle ORN, MD;  Location: [facility] INVASIVE CV LAB;  Service: Cardiovascular;  Laterality: N/A;   MITRAL VALVE REPLACEMENT N/A 11/15/2015   ligation of left atrial appendage, maze procedure at [facility] IMPLANT N/A 05/10/2021   Procedure: PACEMAKER IMPLANT;  Surgeon: Waddell Danelle ORN, MD;  Location: [facility] INVASIVE CV LAB;  Service: Cardiovascular;  Laterality: N/A;   PACEMAKER LEADLESS INSERTION N/A 09/20/2023   Procedure:  PACEMAKER LEADLESS INSERTION;  Surgeon: Nancey Eulas BRAVO, MD;  Location: [facility] INVASIVE CV LAB;  Service: Cardiovascular;  Laterality: N/A;    Social History   Tobacco Use  Smoking Status Former   Current packs/day: 0.00   Types: Cigarettes   Quit date: 05/26/1965   Years since quitting: 58.5  Smokeless Tobacco Never    Social History   Substance and Sexual Activity  Alcohol Use Yes   Alcohol/week: 1.0 standard drink of alcohol   Types: 1 Standard drinks or equivalent per week   Comment: socially    Family History  Problem Relation Age of Onset   Heart failure Mother 23   Heart attack Father 8    Review of Systems: As noted in HPI.   All other systems were reviewed and are negative.  Physical Exam: BP (!) 96/50   Pulse 65   Ht 5' 2 (1.575 m)   Wt 188 lb 4.8 oz (85.4 kg)   SpO2 93%   BMI 34.44 kg/m   Body mass index is 34.44 kg/m. GENERAL:  Well appearing obese WF in NAD HEENT:  PERRL, EOMI, sclera are clear. Oropharynx is clear. NECK:  No jugular venous distention, carotid upstroke brisk and symmetric,  no bruits, no thyromegaly or adenopathy LUNGS:  Clear to auscultation bilaterally CHEST:  Unremarkable HEART:  RRR- tachy.   PMI not displaced or sustained,S1 and S2 within normal limits, no S3, no S4: no clicks, no rubs, gr 1/6 systolic murmur at the apex>> RUSB ABD:  Soft, nontender. BS +, no masses or bruits. No hepatomegaly, no splenomegaly EXT:  2 + pulses throughout, no edema, no cyanosis no clubbing SKIN:  Warm and dry.  No rashes NEURO:  Alert and oriented x 3. Cranial nerves II through XII intact. PSYCH:  Cognitively intact    LABORATORY DATA:   Lab Results  Component Value Date   WBC 8.0 09/19/2023   HGB 12.5 09/19/2023   HCT 40.6 09/19/2023   PLT 247 09/19/2023   GLUCOSE 110 (H) 09/20/2023   CHOL 145 03/22/2021   TRIG 172 (H) 03/22/2021   HDL 46 03/22/2021   LDLCALC 70 03/22/2021   ALT 8 09/19/2023   AST 17 09/19/2023   NA 139 09/20/2023   K 4.1 09/20/2023   CL 106 09/20/2023   CREATININE 2.27 (H) 09/20/2023   BUN 37 (H) 09/20/2023   CO2 21 (L) 09/20/2023   TSH 7.730 (H) 03/22/2021   INR 1.03 07/06/2015   Labs dated 03/11/16: creatinine 1.6 Dated 07/27/16: cholesterol 192, triglycerides 165, HDL 52, LDL 107. Creatinine 1.27. Potassium and ALT normal.  Dated 02/07/17: BUN 27, creatinine 1.25. Glucose 113. Other chemistries normal. Cholesterol 199, triglycerides 212, HDL 44, LDL 113.  Dated 02/08/18: cholesterol 161, triglycerides 209, HDL 44, LDL 75. LFTs normal. Dated 10/13/19: A1c 6.7%. triglycerides 269, HDL 39. Unable to see other results in [facility] Dated 01/13/20: BUN 33, creatinine 1.76. glucose 137. Potassium 5.1. sodium 144. A1c 6.4% Dated 12/03/20: cholesterol 151, triglycerides 185, HDL 42, LDL 78. A1c 6.5%. BUN 37, creatinine 1.67. otherwise CMET normal. Dated 12/08/21: cholesterol 143, triglycerides 279, HDL 37, LDL 61, A1c 6.6%. creatinine 1.72. otherwise CMET and CBC normal. Dated 12/13/22: A1c 6.5%. cholesterol 125, triglycerides 134, HDL 42, LDL 59.  Creatinine 1.82. Potassium and TSH normal.  Dated 12/13/22: cholesterol 125, triglycerides 134, HDL 42, LDL 59.  Dated 09/26/23: BUN 30, creatinine 1.94.otherwise BMET normal. Hgb 12.5.        Echo 02/03/16: Study Conclusions   -  Left ventricle: The cavity size was normal. There was moderate   concentric hypertrophy. Systolic function was normal. The   estimated ejection fraction was in the range of 60% to 65%. Wall   motion was normal; there were no regional wall motion   abnormalities. The study is not technically sufficient to allow   evaluation of LV diastolic function due to the presence of a   bioprosthetic mitral valve. - Aortic valve: Valve mobility was mildly restricted. There was   mild stenosis. There was mild regurgitation. - Mitral valve: Mildly calcified annulus. A 29mm St. Jude biocor   bioprosthesis was present and functioning well. - Left atrium: The atrium was severely dilated. - Right ventricle: The cavity size was normal. Wall thickness was   normal. Systolic function was normal. - Tricuspid valve: There was mild regurgitation. - Pulmonary arteries: Systolic pressure was mildly increased. PA   peak pressure: 42 mm Hg (S).   Impressions:   - Since the last echo 06/15/2015, a bioprosthetic mitral valve is   present and functioning well. Septal wall thickness is now 1.5 cm   compared with 1.8 cm pre-operatively.  Echo 04/19/17: Study Conclusions   - Valve surgery. Mitral valve replacement with a [employer]   porcine valve. 29mm Biocor bioprosthetic valve. - Left ventricle: The cavity size was normal. Wall thickness was   increased in a pattern of mild LVH. Systolic function was normal.   The estimated ejection fraction was in the range of 55% to 60%.   Wall motion was normal; there were no regional wall motion   abnormalities. Doppler parameters are consistent with a   reversible restrictive pattern, indicative of decreased left   ventricular diastolic  compliance and/or increased left atrial   pressure (grade 3 diastolic dysfunction). - Aortic valve: There was trivial regurgitation. Mean gradient (S):   11 mm Hg. Peak gradient (S): 19 mm Hg. - Mitral valve: A bioprosthesis was present. The prosthesis had a   normal range of motion. Mean gradient (D): 5 mm Hg. - Left atrium: The atrium was mildly dilated. - Pulmonary arteries: Systolic pressure was moderately increased.   PA peak pressure: 56 mm Hg (S).  Echocardiogram 10/15/2018: 1. The left ventricle has severely reduced systolic function, with an ejection fraction of 25-30%. The cavity size was normal. Left ventricular diastolic function could not be evaluated. Elevated mean left atrial pressure Left ventricular diffuse  hypokinesis.  2. The right ventricle has moderately reduced systolic function. The cavity was normal. Right ventricular systolic pressure is mildly elevated with an estimated pressure of 41.8 mmHg.  3. Left atrial size was moderately dilated.  4. The tricuspid valve is grossly normal. Tricuspid valve regurgitation is moderate.  5. The aortic valve has an indeterminate number of cusps. Mild calcification of the aortic valve. Aortic valve regurgitation is trivial by color flow Doppler. No stenosis of the aortic valve.  6. The aorta is normal unless otherwise noted.  7. The inferior vena cava was dilated in size with >50% respiratory variability.  8. Septal dyssynergy and global hypokinesis; overall severe LV dysfunction; trace AI; s/p MVR with no MR; moderate LAE; moderate RV dysfunction; moderate TR and mild pulmonary hypertension.  Echo: 01/13/19: IMPRESSIONS     1. Left ventricular ejection fraction, by visual estimation, is 60 to  65%. The left ventricle has normal function. There is severely increased  left ventricular hypertrophy.   2. Left ventricular diastolic function could not be evaluated.   3. The  left ventricle has no regional wall motion abnormalities.    4. Global right ventricle has normal systolic function.The right  ventricular size is normal. No increase in right ventricular wall  thickness.   5. Left atrial size was normal.   6. Right atrial size was normal.   7. The mitral valve has been repaired/replaced. No evidence of mitral  valve regurgitation.   8. The tricuspid valve is grossly normal. Tricuspid valve regurgitation  is trivial.   9. The aortic valve is abnormal. Aortic valve regurgitation is mild. Mild  aortic valve sclerosis without stenosis.  10. Pulmonic regurgitation is mild.  11. The pulmonic valve was normal in structure. Pulmonic valve  regurgitation is mild.  12. The atrial septum is grossly normal.    Echo 03/25/21: IMPRESSIONS     1. In afib/flutter with RVR during study. Left ventricular ejection  fraction, by estimation, is 30 to 35%. The left ventricle has moderately  decreased function. The left ventricle demonstrates global hypokinesis.  There is mild left ventricular  hypertrophy. Left ventricular diastolic parameters are indeterminate.   2. Right ventricular systolic function is moderately reduced. The right  ventricular size is normal. There is normal pulmonary artery systolic  pressure. The estimated right ventricular systolic pressure is 34.6 mmHg.   3. Left atrial size was moderately dilated.   4. There is a Biocor tissue heart valve present in the mitral position.  Procedure Date: 11/15/2015.      Trivial mitral valve regurgitation. Echo findings are consistent with  stenosis of the mitral prosthesis.      The mean mitral valve gradient is 10.0 mmHg at 123 bpm. MVA 1.3 cm^2  by continuity equation   5. The aortic valve was not well visualized. Aortic valve regurgitation  is not visualized. Mild to moderate aortic valve stenosis. Mild AS by  gradients (MG 12 mmHg, Vmax 2.4 m/s), moderate by AVA (1.0cm^2) and DI  (0.35). Low SV index (22 cc/m^2), likely   low flow low gradient moderate AS   6.  The inferior vena cava is normal in size with greater than 50%  respiratory variability, suggesting right atrial pressure of 3 mmHg.   Echo 02/20/22: IMPRESSIONS     1. History of HOCM s/p septal myoectomy. Left ventricular ejection  fraction, by estimation, is 55 to 60%. The left ventricle has normal  function. The left ventricle has no regional wall motion abnormalities.  Left ventricular diastolic function could not   be evaluated.   2. Right ventricular systolic function is normal. The right ventricular  size is normal. There is normal pulmonary artery systolic pressure. The  estimated right ventricular systolic pressure is 26.4 mmHg.   3. Left atrial size was mildly dilated.   4. The mitral valve has been repaired/replaced. No evidence of mitral  valve regurgitation. The mean mitral valve gradient is 4.0 mmHg with  average heart rate of 69 bpm. There is a 27 mm bioprosthetic valve present  in the mitral position. Procedure Date:   11/15/2015. Echo findings are consistent with normal structure and  function of the mitral valve prosthesis.   5. The aortic valve is tricuspid. Aortic valve regurgitation is trivial.  Aortic valve sclerosis/calcification is present, without any evidence of  aortic stenosis.   6. The inferior vena cava is normal in size with greater than 50%  respiratory variability, suggesting right atrial pressure of 3 mmHg.   Comparison(s): Changes from prior study are noted. The left ventricular  function has improved.  Assessment / Plan: 1. HOCM.  s/p septal myectomy on 11/05/15. Excellent result by follow up Echo. No significant residual LVOT gradient. Clinically doing well.   2. Severe MR s/p MVR with #29 St. Jude Biocor valve. Routine SBE prophylaxis.   3.  Chronic systolic CHF. LV dysfunction associated in the past with atrial flutter. EF 25-30%. Likely tachycardia mediated with new Atrial flutter with RVR. After DCCV EF  normalized based on Echo in  December 2020.  Last February had recurrent Atrial flutter EF is back down to 30-35%.  Once again EF normalized with restoration of NSR.   4. Atrial flutter sustained.  post op open heart surgery. S/p left sided MAZE. Seen in September 2020 with  new onset Atrial flutter.  DCCV on 10/23/18. Had been on amiodarone  post op heart surgery. Patient questions if this was the cause of her optic nerve problem. Has been on Xarelto  long term and hasn't missed any doses. Now with recurrence in Feb  2023  S/p DCCV. Seen by Dr Waddell- poor candidate for AAD therapy or ablation. Now s/p pacemaker implant. Rate controlled.  5. Hypercholesterolemia. On low dose Crestor . No history of CAD. LDL 59   6. CKD stage 3b.   7. Obesity.   8. S/p pacemaker implant. 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    Follow up in 6 months.

## 2023-11-23 ENCOUNTER — Encounter (INDEPENDENT_AMBULATORY_CARE_PROVIDER_SITE_OTHER): Admitting: Ophthalmology

## 2023-11-23 DIAGNOSIS — H353231 Exudative age-related macular degeneration, bilateral, with active choroidal neovascularization: Secondary | ICD-10-CM

## 2023-11-23 DIAGNOSIS — H43813 Vitreous degeneration, bilateral: Secondary | ICD-10-CM

## 2023-12-03 ENCOUNTER — Encounter: Payer: Self-pay | Admitting: Cardiology

## 2023-12-03 ENCOUNTER — Ambulatory Visit: Attending: Cardiology | Admitting: Cardiology

## 2023-12-03 VITALS — BP 96/50 | HR 65 | Ht 62.0 in | Wt 188.3 lb

## 2023-12-03 DIAGNOSIS — I484 Atypical atrial flutter: Secondary | ICD-10-CM | POA: Insufficient documentation

## 2023-12-03 DIAGNOSIS — I421 Obstructive hypertrophic cardiomyopathy: Secondary | ICD-10-CM | POA: Insufficient documentation

## 2023-12-03 DIAGNOSIS — I442 Atrioventricular block, complete: Secondary | ICD-10-CM | POA: Insufficient documentation

## 2023-12-03 DIAGNOSIS — I5032 Chronic diastolic (congestive) heart failure: Secondary | ICD-10-CM | POA: Insufficient documentation

## 2023-12-03 DIAGNOSIS — N184 Chronic kidney disease, stage 4 (severe): Secondary | ICD-10-CM | POA: Diagnosis not present

## 2023-12-03 NOTE — Patient Instructions (Addendum)
 Medication Instructions:  Continue same medications *If you need a refill on your cardiac medications before your next appointment, please call your pharmacy*  Lab Work: None ordered  Testing/Procedures: None ordered  Follow-Up: At [facility], you and your health needs are our priority.  As part of our continuing mission to provide you with exceptional heart care, our providers are all part of one team.  This team includes your primary Cardiologist (physician) and Advanced Practice Providers or APPs (Physician Assistants and Nurse Practitioners) who all work together to provide you with the care you need, when you need it.  Your next appointment:  6 months    Call in Feb to schedule May appointment     Provider:  Dr.Jordan   We recommend signing up for the patient portal called MyChart.  Sign up information is provided on this After Visit Summary.  MyChart is used to connect with patients for Virtual Visits (Telemedicine).  Patients are able to view lab/test results, encounter notes, upcoming appointments, etc.  Non-urgent messages can be sent to your provider as well.   To learn more about what you can do with MyChart, go to [email or web address].

## 2023-12-21 ENCOUNTER — Ambulatory Visit: Attending: Cardiovascular Disease | Admitting: Cardiovascular Disease

## 2023-12-21 ENCOUNTER — Encounter: Payer: Self-pay | Admitting: Cardiovascular Disease

## 2023-12-21 VITALS — BP 102/62 | HR 96 | Ht 62.0 in | Wt 188.0 lb

## 2023-12-21 DIAGNOSIS — I421 Obstructive hypertrophic cardiomyopathy: Secondary | ICD-10-CM | POA: Insufficient documentation

## 2023-12-21 NOTE — Patient Instructions (Addendum)
 Medication Instructions:  Your physician recommends that you continue on your current medications as directed. Please refer to the Current Medication list given to you today.  *If you need a refill on your cardiac medications before your next appointment, please call your pharmacy*  Lab Work: None ordered.  If you have labs (blood work) drawn today and your tests are completely normal, you will receive your results only by: MyChart Message (if you have MyChart) OR A paper copy in the mail If you have any lab test that is abnormal or we need to change your treatment, we will call you to review the results.  Testing/Procedures: None ordered.   Follow-Up: At [facility], you and your health needs are our priority.  As part of our continuing mission to provide you with exceptional heart care, our providers are all part of one team.  This team includes your primary Cardiologist (physician) and Advanced Practice Providers or APPs (Physician Assistants and Nurse Practitioners) who all work together to provide you with the care you need, when you need it.  Your next appointment:   6 months with APP

## 2023-12-21 NOTE — Progress Notes (Signed)
  Electrophysiology Office Note:    Date:  12/21/2023   ID:  Melinda Gonzales, DOB 01-07-1935, MRN [id number]  PCP:  Signa Dire, MD (Inactive)   [facility] Providers Cardiologist:  Peter Jordan, MD     Referring MD: No ref. provider found   History of Present Illness:    Melinda Gonzales is a 89 y.o. female with a medical history significant for atrial fibrillation, hypertrophic cardiomyopathy status post septal myectomy, Abbott dual-chamber pacemaker with complete heart block referred for placement of a leadless pacemaker.     Discussed the use of AI scribe software for clinical note transcription with the patient, who gave verbal consent to proceed.  History of Present Illness ELEYNA Gonzales is an 89 year old female with hypertrophic cardiomyopathy status post septal myectomy, atrial fibrillation status post AV node ablation, complete heart block and pacemaker dependence who presents to discuss placement of a leadless pacemaker. She was referred by Doctor Waddell for placement of a leadless pacemaker.  She has complete heart block and is pacemaker dependent. Her history of twiddling has led to pacemaker lead complications, including entanglement and migration into the pocket, necessitating a lead revision in May 2023. An AV node ablation was performed in April 2023 due to atrial fibrillation, and the original pacemaker was implanted on May 10, 2021. She also underwent mitral valve replacement.         Today, she is at baseline and has no acute complaints.  EKGs/Labs/Other Studies Reviewed Today:     Echocardiogram:  TTE February 20, 2022 LVEF 55 to 60%.  Left atrial size is mildly dilated.  27 mm bioprosthetic mitral valve    EKG:         Physical Exam:    VS:  BP 102/62   Pulse 96   Ht 5' 2 (1.575 m)   Wt 188 lb (85.3 kg)   SpO2 97%   BMI 34.39 kg/m     Wt Readings from Last 3 Encounters:  12/21/23 188 lb (85.3 kg)  12/03/23 188 lb 4.8  oz (85.4 kg)  09/20/23 184 lb 11.2 oz (83.8 kg)     GEN: Well nourished, well developed in no acute distress CARDIAC: RRR, no murmurs, rubs, gallops RESPIRATORY:  Normal work of breathing MUSCULOSKELETAL: no edema    ASSESSMENT & PLAN:     Complete heart block Abbott dual-chamber pacemaker had increased threshold and severe retraction of the atrial lead due to twiddler's syndrome; infact, the device rotated as she pulled herself up in bed S/p aveir leadless pacemaker Device was interrogated today and is functioning normally She is pacemaker dependent   Atrial fibrillation Status post AVJ ablation Continue Xarelto  15 mg daily  Chronic diastolic failure Continue empagliflozin  10 mg     Signed, Eulas FORBES Furbish, MD  12/21/2023 3:09 PM    [facility]

## 2024-01-11 ENCOUNTER — Encounter (INDEPENDENT_AMBULATORY_CARE_PROVIDER_SITE_OTHER): Admitting: Ophthalmology

## 2024-01-15 ENCOUNTER — Encounter: Payer: Self-pay | Admitting: Cardiovascular Disease

## 2024-01-29 ENCOUNTER — Other Ambulatory Visit: Payer: Self-pay | Admitting: Cardiovascular Disease

## 2024-01-29 NOTE — Telephone Encounter (Signed)
 Pt is stating that she takes furosemide  40 mg tablet BID, this is not how it is listed on the directions for the pt to take this medication. Please clarify how pt is suppose to be taking medication? Thanks

## 2024-01-29 NOTE — Telephone Encounter (Signed)
" °*  STAT* If patient is at the pharmacy, call can be transferred to refill team.   1. Which medications need to be refilled? (please list name of each medication and dose if known) furosemide  (LASIX ) 40 MG tablet    2. Would you like to learn more about the convenience, safety, & potential cost savings by using the [facility]?    3. Are you open to using the [facility] (Type [facility].   ).   4. Which pharmacy/location (including street and city if local pharmacy) is medication to be sent to?furosemide  (LASIX ) 40 MG tablet    5. Do they need a 30 day or 90 day supply? [facility] [address]  Patient is out of medication.  They won't fill it because it doesn't say Take 1 tablet twice daily on Monday, Wednesday, and Friday. Other days take once daily. And are saying it's too soon to fill.  "

## 2024-01-30 ENCOUNTER — Telehealth: Payer: Self-pay

## 2024-01-30 NOTE — Telephone Encounter (Signed)
 Bryan with [employer] reached out. 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   She is due to see [facility] in February for f/u.  Can you schedule her with [facility] and put in the appt notes:  [facility] to make programming changes to outputs  Let me know when she is coming so I can follow up with Dorise.  Thanks.

## 2024-02-01 NOTE — Telephone Encounter (Signed)
 Spoke w/ patients husband, Dorn  - [facility] is [facility].

## 2024-02-06 NOTE — Telephone Encounter (Signed)
 [facility] - Dorise has been notified patient coming in at 28 on 03/06/24.

## 2024-02-11 ENCOUNTER — Ambulatory Visit: Attending: Cardiovascular Disease

## 2024-02-11 DIAGNOSIS — I442 Atrioventricular block, complete: Secondary | ICD-10-CM | POA: Diagnosis not present

## 2024-02-12 LAB — CUP PACEART REMOTE DEVICE CHECK
Battery Remaining Longevity: 22 mo
Battery Voltage: 2.9 V
Brady Statistic RV Percent Paced: 100 %
Date Time Interrogation Session: 20260112102514
Implantable Pulse Generator Implant Date: 20250821
Lead Channel Pacing Threshold Amplitude: 2.75 V
Lead Channel Pacing Threshold Pulse Width: 0.5 ms
Lead Channel Setting Pacing Amplitude: 4 V
Lead Channel Setting Pacing Pulse Width: 0.7 ms
Lead Channel Setting Sensing Sensitivity: 4 mV
Pulse Gen Serial Number: 1364210

## 2024-02-14 ENCOUNTER — Encounter (INDEPENDENT_AMBULATORY_CARE_PROVIDER_SITE_OTHER): Admitting: Ophthalmology

## 2024-02-15 ENCOUNTER — Ambulatory Visit: Payer: Self-pay | Admitting: Cardiovascular Disease

## 2024-02-15 NOTE — Progress Notes (Signed)
 Remote PPM Transmission

## 2024-02-19 ENCOUNTER — Encounter (INDEPENDENT_AMBULATORY_CARE_PROVIDER_SITE_OTHER): Admitting: Ophthalmology

## 2024-02-19 DIAGNOSIS — I10 Essential (primary) hypertension: Secondary | ICD-10-CM

## 2024-02-19 DIAGNOSIS — H43813 Vitreous degeneration, bilateral: Secondary | ICD-10-CM | POA: Diagnosis not present

## 2024-02-19 DIAGNOSIS — H353231 Exudative age-related macular degeneration, bilateral, with active choroidal neovascularization: Secondary | ICD-10-CM

## 2024-02-19 DIAGNOSIS — H35033 Hypertensive retinopathy, bilateral: Secondary | ICD-10-CM

## 2024-02-20 ENCOUNTER — Other Ambulatory Visit: Payer: Self-pay | Admitting: Cardiology

## 2024-02-22 ENCOUNTER — Telehealth: Payer: Self-pay | Admitting: Cardiology

## 2024-02-22 NOTE — Telephone Encounter (Signed)
" °*  STAT* If patient is at the pharmacy, call can be transferred to refill team.   1. Which medications need to be refilled? (please list name of each medication and dose if known) empagliflozin  (JARDIANCE ) 10 MG TABS tablet   carvedilol  (COREG ) 6.25 MG tablet   rosuvastatin  (CRESTOR ) 10 MG tablet   2. Would you like to learn more about the convenience, safety, & potential cost savings by using the [facility]? No   3. Are you open to using the [facility] ([facility]. ). No    4. Which pharmacy/location (including street and city if local pharmacy) is medication to be sent to?[id number] - [place], [place] - [address]    5. Do they need a 30 day or 90 day supply? 90  Pt is currently out of medication, please advise   "

## 2024-02-25 MED ORDER — ROSUVASTATIN CALCIUM 10 MG PO TABS: 10.0000 mg | ORAL_TABLET | Freq: Every day | ORAL | 1 refills | Status: AC

## 2024-02-25 MED ORDER — EMPAGLIFLOZIN 10 MG PO TABS: 10.0000 mg | ORAL_TABLET | Freq: Every day | ORAL | 1 refills | Status: AC

## 2024-02-25 MED ORDER — CARVEDILOL 6.25 MG PO TABS: 6.2500 mg | ORAL_TABLET | Freq: Two times a day (BID) | ORAL | 1 refills | Status: AC

## 2024-02-25 NOTE — Telephone Encounter (Signed)
 Reill sent

## 2024-03-06 ENCOUNTER — Ambulatory Visit

## 2024-04-01 ENCOUNTER — Ambulatory Visit

## 2024-04-07 ENCOUNTER — Encounter (INDEPENDENT_AMBULATORY_CARE_PROVIDER_SITE_OTHER): Admitting: Ophthalmology

## 2024-05-12 ENCOUNTER — Ambulatory Visit

## 2024-08-11 ENCOUNTER — Ambulatory Visit

## 2024-11-10 ENCOUNTER — Ambulatory Visit

## 2025-02-09 ENCOUNTER — Ambulatory Visit
